# Patient Record
Sex: Male | Born: 1940 | Race: White | Hispanic: No | Marital: Married | Smoking: Never smoker
Health system: Southern US, Community
[De-identification: ages and names within clinical notes are randomized; demographics above are authoritative.]

## PROBLEM LIST (undated history)

## (undated) DIAGNOSIS — I509 Heart failure, unspecified: Secondary | ICD-10-CM

## (undated) DIAGNOSIS — G4730 Sleep apnea, unspecified: Secondary | ICD-10-CM

## (undated) DIAGNOSIS — I4891 Unspecified atrial fibrillation: Secondary | ICD-10-CM

## (undated) DIAGNOSIS — I219 Acute myocardial infarction, unspecified: Secondary | ICD-10-CM

## (undated) DIAGNOSIS — I499 Cardiac arrhythmia, unspecified: Secondary | ICD-10-CM

## (undated) DIAGNOSIS — I2510 Atherosclerotic heart disease of native coronary artery without angina pectoris: Secondary | ICD-10-CM

## (undated) DIAGNOSIS — I639 Cerebral infarction, unspecified: Secondary | ICD-10-CM

## (undated) DIAGNOSIS — M1990 Unspecified osteoarthritis, unspecified site: Secondary | ICD-10-CM

## (undated) DIAGNOSIS — C801 Malignant (primary) neoplasm, unspecified: Secondary | ICD-10-CM

## (undated) HISTORY — PX: EYE SURGERY: SHX253

## (undated) HISTORY — PX: TONSILLECTOMY: SUR1361

## (undated) HISTORY — PX: CARDIAC SURGERY: SHX584

## (undated) HISTORY — PX: CARDIAC CATHETERIZATION: SHX172

## (undated) HISTORY — PX: APPENDECTOMY: SHX54

## (undated) HISTORY — PX: CARDIAC VALVE REPLACEMENT: SHX585

## (undated) HISTORY — PX: HERNIA REPAIR: SHX51

## (undated) HISTORY — PX: CORONARY ARTERY BYPASS GRAFT: SHX141

---

## 2004-07-17 ENCOUNTER — Ambulatory Visit: Payer: Self-pay | Admitting: Unknown Physician Specialty

## 2004-08-31 ENCOUNTER — Other Ambulatory Visit: Payer: Self-pay

## 2004-09-07 ENCOUNTER — Ambulatory Visit: Payer: Self-pay | Admitting: Surgery

## 2005-11-09 ENCOUNTER — Ambulatory Visit: Payer: Self-pay | Admitting: Cardiology

## 2006-06-04 ENCOUNTER — Ambulatory Visit: Payer: Self-pay

## 2006-06-06 ENCOUNTER — Ambulatory Visit: Payer: Self-pay

## 2006-11-28 ENCOUNTER — Ambulatory Visit: Payer: Self-pay | Admitting: Cardiology

## 2006-12-03 ENCOUNTER — Ambulatory Visit: Payer: Self-pay | Admitting: Cardiology

## 2007-07-24 ENCOUNTER — Ambulatory Visit: Payer: Self-pay | Admitting: General Practice

## 2007-08-11 ENCOUNTER — Encounter: Payer: Self-pay | Admitting: General Practice

## 2007-08-25 ENCOUNTER — Encounter: Payer: Self-pay | Admitting: General Practice

## 2007-12-01 ENCOUNTER — Ambulatory Visit: Payer: Self-pay | Admitting: Cardiology

## 2007-12-02 ENCOUNTER — Ambulatory Visit: Payer: Self-pay | Admitting: Cardiology

## 2008-06-02 ENCOUNTER — Ambulatory Visit: Payer: Self-pay | Admitting: General Practice

## 2009-05-12 ENCOUNTER — Encounter: Admission: RE | Admit: 2009-05-12 | Discharge: 2009-05-12 | Payer: Self-pay | Admitting: Neurosurgery

## 2009-12-13 ENCOUNTER — Encounter: Admission: RE | Admit: 2009-12-13 | Discharge: 2009-12-13 | Payer: Self-pay | Admitting: Neurosurgery

## 2010-02-28 ENCOUNTER — Encounter: Admission: RE | Admit: 2010-02-28 | Discharge: 2010-02-28 | Payer: Self-pay | Admitting: Neurosurgery

## 2011-01-26 ENCOUNTER — Other Ambulatory Visit: Payer: Self-pay | Admitting: Neurosurgery

## 2011-01-26 DIAGNOSIS — M47816 Spondylosis without myelopathy or radiculopathy, lumbar region: Secondary | ICD-10-CM

## 2011-01-29 ENCOUNTER — Ambulatory Visit
Admission: RE | Admit: 2011-01-29 | Discharge: 2011-01-29 | Disposition: A | Discharge status: Home / Self Care | Payer: PRIVATE HEALTH INSURANCE | Source: Ambulatory Visit | Attending: Neurosurgery | Admitting: Neurosurgery

## 2011-01-29 DIAGNOSIS — M47816 Spondylosis without myelopathy or radiculopathy, lumbar region: Secondary | ICD-10-CM

## 2011-05-07 ENCOUNTER — Emergency Department: Payer: Self-pay | Admitting: *Deleted

## 2011-05-21 ENCOUNTER — Other Ambulatory Visit: Payer: Self-pay | Admitting: Neurosurgery

## 2011-05-21 DIAGNOSIS — M47816 Spondylosis without myelopathy or radiculopathy, lumbar region: Secondary | ICD-10-CM

## 2011-05-23 ENCOUNTER — Ambulatory Visit
Admission: RE | Admit: 2011-05-23 | Discharge: 2011-05-23 | Disposition: A | Discharge status: Home / Self Care | Payer: PRIVATE HEALTH INSURANCE | Source: Ambulatory Visit | Attending: Neurosurgery | Admitting: Neurosurgery

## 2011-05-23 DIAGNOSIS — M47816 Spondylosis without myelopathy or radiculopathy, lumbar region: Secondary | ICD-10-CM

## 2011-05-23 MED ORDER — IOHEXOL 180 MG/ML  SOLN
1.0000 mL | Freq: Once | INTRAMUSCULAR | Status: AC | PRN
  Administered 2011-05-23: 1 mL via EPIDURAL

## 2011-05-23 MED ORDER — METHYLPREDNISOLONE ACETATE 40 MG/ML INJ SUSP (RADIOLOG
120.0000 mg | Freq: Once | INTRAMUSCULAR | Status: AC
  Administered 2011-05-23: 120 mg via EPIDURAL

## 2011-06-15 ENCOUNTER — Ambulatory Visit: Payer: Self-pay | Admitting: Surgery

## 2011-06-22 ENCOUNTER — Ambulatory Visit: Payer: Self-pay | Admitting: Surgery

## 2011-08-07 ENCOUNTER — Other Ambulatory Visit: Payer: Self-pay | Admitting: Neurosurgery

## 2011-08-07 DIAGNOSIS — M47816 Spondylosis without myelopathy or radiculopathy, lumbar region: Secondary | ICD-10-CM

## 2011-08-08 ENCOUNTER — Other Ambulatory Visit: Payer: PRIVATE HEALTH INSURANCE

## 2011-08-08 ENCOUNTER — Ambulatory Visit
Admission: RE | Admit: 2011-08-08 | Discharge: 2011-08-08 | Disposition: A | Discharge status: Home / Self Care | Payer: PRIVATE HEALTH INSURANCE | Source: Ambulatory Visit | Attending: Neurosurgery | Admitting: Neurosurgery

## 2011-08-08 DIAGNOSIS — M47816 Spondylosis without myelopathy or radiculopathy, lumbar region: Secondary | ICD-10-CM

## 2011-08-08 MED ORDER — IOHEXOL 180 MG/ML  SOLN
1.0000 mL | Freq: Once | INTRAMUSCULAR | Status: AC | PRN
  Administered 2011-08-08: 1 mL via EPIDURAL

## 2011-08-08 MED ORDER — METHYLPREDNISOLONE ACETATE 40 MG/ML INJ SUSP (RADIOLOG
120.0000 mg | Freq: Once | INTRAMUSCULAR | Status: AC
  Administered 2011-08-08: 120 mg via EPIDURAL

## 2011-08-09 ENCOUNTER — Other Ambulatory Visit: Payer: PRIVATE HEALTH INSURANCE

## 2013-02-26 ENCOUNTER — Ambulatory Visit: Payer: Self-pay | Admitting: Internal Medicine

## 2013-03-12 ENCOUNTER — Ambulatory Visit: Payer: Self-pay | Admitting: Internal Medicine

## 2014-03-06 DIAGNOSIS — Z951 Presence of aortocoronary bypass graft: Secondary | ICD-10-CM | POA: Insufficient documentation

## 2014-03-06 DIAGNOSIS — G4733 Obstructive sleep apnea (adult) (pediatric): Secondary | ICD-10-CM

## 2014-04-15 ENCOUNTER — Ambulatory Visit: Payer: Self-pay | Admitting: Internal Medicine

## 2014-12-04 ENCOUNTER — Inpatient Hospital Stay: Payer: Self-pay | Admitting: Internal Medicine

## 2015-01-06 ENCOUNTER — Ambulatory Visit
Admit: 2015-01-06 | Disposition: A | Discharge status: Home / Self Care | Payer: Self-pay | Attending: Cardiology | Admitting: Cardiology

## 2015-01-23 NOTE — Consult Note (Signed)
PATIENT NAME:  Juan [name], Juan [name] MR#:  [id number] DATE OF BIRTH:  05-24-1941  DATE OF CONSULTATION:  12/05/2014   REFERRING PHYSICIAN:  Dr. Bridgett Larsson.  CONSULTING PHYSICIAN:  Corey Skains, MD  REASON FOR CONSULTATION: Ventricular tachycardia, atrial fibrillation, elevated troponin.   CHIEF COMPLAINT: "Just weak and fatigued."   HISTORY OF PRESENT ILLNESS: This is a 74 year old male with known coronary artery disease, status post coronary artery bypass graft and left circumflex stenting in the past but no apparent previous myocardial infarction. The patient has also had a mitral valve prolapse, status post mitral valve ring in the remote past. The patient has had a recent evaluation from a cardiac standpoint in December for which he has done well. Recently, he has been mildly short of breath with physical activity, and he has had some weakness, nausea and vomiting. When seen in the Emergency Room, he had an elevated troponin of 0.42 with atrial fibrillation with rapid ventricular rate. In addition to that, telemetry had shown short runs of ventricular tachycardia, consistent with other significant concerns. The patient has had an echocardiogram also showing severe global LV systolic dysfunction with an ejection fraction of 20% and 4-chamber dilated cardiomyopathy of unknown etiology which is new. There has been no other congestive heart failure type symptoms at this time. Currently, the patient feels relatively well but has had some worsening episodes of shortness of breath and chest discomfort when doing physical activity in the yard. This has been slightly more frequent and intense over the last several weeks.   The remainder of the review of systems is negative for vision change, ringing in the ears, hearing loss, cough, congestion, bloody stools, stomach pain, extremity pain, leg weakness, cramping of the buttocks, known blood clots, headaches, blackouts, dizzy spells, nosebleeds, trouble swallowing,  frequent urination, urination at night, muscle weakness, numbness, anxiety, depression, skin lesions or skin rashes.   PAST MEDICAL HISTORY:  1.  Mitral valve prolapse, status post mitral valve ring.  2.  Coronary artery disease, status post coronary artery bypass graft.  3.  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  4.  Mixed hyperlipidemia.  5.  Congestive heart failure.   FAMILY HISTORY: Multiple family history of valvular heart disease as well as atrial fibrillation and hypertension.   SOCIAL HISTORY: Currently denies alcohol or tobacco use.   ALLERGIES: As listed.   MEDICATIONS: As listed.   PHYSICAL EXAMINATION:  VITAL SIGNS: Blood pressure is 132/68 bilaterally, heart rate 112 and slightly irregular.  GENERAL: He is a well-appearing male, in no acute distress.  HEENT: No icterus, thyromegaly, ulcers, hemorrhage or xanthelasma.  CARDIOVASCULAR: Regular rate and rhythm. Normal S1, S2. A 2/6 apical murmur, consistent with mitral regurgitation. PMI is inferiorly displaced. Carotid upstroke normal without bruit. Jugular venous pressure is normal.  LUNGS: Have bibasilar crackles with normal respirations.  ABDOMEN: Soft, nontender without hepatosplenomegaly or masses. Abdominal aorta is normal size without bruit.  EXTREMITIES: Show 2+ radial, femoral and dorsal pedal pulses with trace lower extremity edema. No cyanosis, clubbing or ulcers.  NEUROLOGIC: He is oriented to time, place and person with normal mood and affect.   ASSESSMENT: This is a 74 year old male with known coronary artery disease, status post coronary artery bypass graft and previous stenting with mitral valve prolapse, status post repair, mixed hyperlipidemia, essential hypertension with new onset of symptoms, consistent with non-ST elevation myocardial infarction with elevation of troponin and severe left ventricular systolic dysfunction with paroxysmal valvular atrial fibrillation.   RECOMMENDATIONS:  1.  Heparinization for myocardial infarction and coronary artery disease, status post coronary artery bypass graft as well as concerns of stroke risk with atrial fibrillation.  2.  Continue serial ECG and enzymes to assess for myocardial infarction.  3.  Proceed to right and left heart catheterization for assessment of coronary anatomy due to non-ST elevation myocardial infarction and severe LV systolic dysfunction and for other evaluation of valvular heart disease as well. The patient understands the risks and benefits of cardiac catheterization. This includes the possibility of death, stroke, heart attack, infection, bleeding or blood clot. He is at low risk for conscious sedation.  4.  Heart rate control with beta blocker for further risk reduction of cardiovascular event and better heart rate control with maintenance of normal sinus rhythm and reduction of ventricular tachycardia.  5.  Further diagnostic testing and treatment options after above.    ____________________________ Corey Skains, MD bjk:JT D: 12/05/2014 08:47:12 ET T: 12/05/2014 14:24:29 ET JOB#: [id number]  cc: Corey Skains, MD, <Dictator> Corey Skains MD ELECTRONICALLY SIGNED 12/09/2014 8:10

## 2015-01-23 NOTE — Discharge Summary (Signed)
PATIENT NAME:  Juan [name], Juan [name] MR#:  [id number] DATE OF BIRTH:  21-Jun-1941  DATE OF ADMISSION:  12/04/2014 DATE OF DISCHARGE:  12/08/2014  REASON FOR ADMISSION: Diarrhea.   HISTORY OF PRESENT ILLNESS: Please see the dictated HPI done by Dr. Bridgett Larsson on 12/04/2014.   PAST MEDICAL HISTORY: 1. ASCVD.  2. Hyperlipidemia.  3. Chronic atrial fibrillation.  4. Status post mitral valve repair.  5. Status post coronary artery bypass grafting.  6. History of pneumothorax.  7. Status post appendectomy.   MEDICATIONS ON ADMISSION: Please see admission note.   ALLERGIES: No known drug allergies.   SOCIAL HISTORY:   Per admission note.   FAMILY HISTORY:  Per admission note.   REVIEW OF SYSTEMS:  Per admission note.   PHYSICAL EXAMINATION: GENERAL: The patient was in no acute distress.  VITAL SIGNS: Were initially remarkable for a blood pressure of 83/64 with a heart rate of 107 which worsened.  HEENT: Was unremarkable.  NECK:  Was supple without JVD.  LUNGS:  Were clear.  CARDIAC: Irregularly irregular rhythm with a rapid rate.   ABDOMEN: Was soft and nontender.  EXTREMITIES: Without edema.  NEUROLOGIC: Grossly nonfocal.   HOSPITAL COURSE: The patient was admitted with diarrhea, presumably due to gastroenteritis with hypotension. He also developed rapid atrial fibrillation. The patient was admitted to the intensive care unit. His troponins were elevated. He was seen in consultation by cardiology. His A-fib was controlled with IV medications. He was subsequently taken to the cath lab which revealed  stable chronic disease with known cardiomyopathy. He was felt to have demand ischemia from his rapid A-fib. His symptoms improved and he was moved out to the floor where he remained stable. He was tolerating his diet well without diarrhea. His A-fib was rate controlled. He subsequently discharged home on 12/08/2014 in stable condition.   DISCHARGE DIAGNOSES: 1. Acute gastroenteritis with diarrhea.   2. Dehydration and hypotension.  3. Rapid atrial fibrillation.  4. Elevated troponin due to demand ischemia.  5. Dilated cardiomyopathy.   DISCHARGE MEDICATIONS: 1. Multivitamin 1 p.o. daily.  2. Niaspan ER 250 mg p.o. q.p.m.  3. Welchol 625 mg 2 tablets b.i.d.  4. Myrbetriq 25 mg p.o. daily.  5. Zetia 10 mg p.o. daily.  6. Norco 5/325, 1 p.o. q. 4 h. p.r.n. pain.  7. Lanoxin 0.125 mg p.o. daily.  8. Eliquis 5 mg p.o. b.i.d.  9. Lopressor 50 mg p.o. t.i.d.  10. Senna 1 p.o. b.i.d. as needed.   FOLLOW-UP PLANS AND APPOINTMENTS: The patient was discharged on a low fat, low cholesterol diet. He will be followed by home health. He will return to see me in the office within one week's time, sooner if needed.    ____________________________ Leonie Douglas Doy Hutching, MD jds:tr D: 12/11/2014 10:56:31 ET T: 12/11/2014 12:20:50 ET JOB#: [id number]  cc: Leonie Douglas. Doy Hutching, MD, <Dictator> Nayanna Seaborn Lennice Sites MD ELECTRONICALLY SIGNED 12/11/2014 15:00

## 2015-01-23 NOTE — H&P (Signed)
PATIENT NAME:  Juan [name], Juan [name] MR#:  [id number] DATE OF BIRTH:  Jul 15, 1941  DATE OF ADMISSION:  12/04/2014  PRIMARY CARE PHYSICIAN   Felipa Furnace, MD    CHIEF COMPLAINT: Diarrhea for 3 to 4 days.   HISTORY OF PRESENT ILLNESS: A 74 year old Caucasian male with a history of CAD and chronic atrial fibrillation presents to the ED with the above chief complaint.  The patient is alert, awake, oriented, in no acute distress.   The patient said he has had diarrhea with watery stool for the past 3 to 4 days. The patient also feels weak and dizzy, so he came to the ED for further evaluation.  The patient was noted to have atrial fibrillation with RVR at 180s.  The patient was treated with normal saline. His blood pressure was high at 190s but later dropped to 70s to 80s.  The patient was treated with bolus normal saline, 3 liters.  Blood pressure was still low at 75/43. Heart rate is better but is still uncontrolled at 130s to 140s.  The patient was also treated with aspirin in the ED.  The patient has chronic atrial fibrillation.  Rate has been controlled. He is not on any anticoagulation, except aspirin 325 mg p.o. daily.  The patient denies any fever or chills. No dysuria or hematuria.  He said he has abdominal pain, which is crampy, sharp, and intermittent 8/10 at maximum, but he has no nausea or vomiting. He denies any melena or bloody stool.   PAST MEDICAL HISTORY: Coronary artery disease, hyperlipidemia, chronic atrial fibrillation.   SOCIAL HISTORY: No smoking, alchol or illicit drugs.   PAST SURGICAL HISTORY: Mitral valve repair and bypass, collapsed lung status post chest tube placement, appendectomy, hernia repair twice and skin cancer removal.   FAMILY HISTORY: Father had a heart attack at 63 years old.   ALLERGIES: None.   HOME MEDICATIONS:  Zetia 10 mg p.o. at bedtime, Welchol 625 mg 3 tablets b.i.d., vitamin E 400 units 1 tablet daily, Vicodin 5 mg-500 mg p.o. tablets 1 to 2 tablets every 6  hours p.r.n., Stresstabs with zinc 1 tablet in the a.m.. Norco 5/325 mg 1 to 2 tablets every 4 hours p.r.n., Niaspan-ER 500 mg p.o. once a day at bedtime, Nadolol 20 mg 0.5 tablet once a day with evening meal. Gelnique gel 10% 1 pack topically once a day in the morning, fish oil 1400 mg p.o. daily, aspirin 325 mg p.o. at bedtime, Altace 2.5 mg p.o. daily.   REVIEW OF SYSTEMS.  CONSTITUTIONAL: The patient denies any fever or chills, but has headache, dizziness, and weakness.  EYES:  No double vision.  ENT: No postnasal drip, slurred speech or dysphagia.  CARDIOVASCULAR: No chest pain, palpitation, orthopnea, or nocturnal dyspnea. No leg edema.  PULMONARY: No cough, sputum, shortness of breath, or hematemesis.  GASTROINTESTINAL: Positive for abdominal pain. No nausea and vomiting but has watery diarrhea. No melena or bloody stool.  GENITOURINARY: No dysuria, hematuria, or incontinence.  SKIN: No rash or jaundice.  NEUROLOGY: No syncope, loss of consciousness, or seizure.  ENDOCRINOLOGY: No polyuria, polydipsia, heat or cold intolerance.  HEMATOLOGY: No easy bleeding or bleeding.   PHYSICAL EXAMINATION:  VITAL SIGNS: Temperature 98.8, blood pressure 83/64, pulse was 107.  When I examined the patient, pulse was 130 and blood pressure was a 75/43, oxygen saturation 93% on room air.  GENERAL: The patient is alert, awake, oriented, in no acute distress.  HEENT: Pupils round, equal and reactive to light  and accommodation. Moist oral mucosa. Clear oropharynx.  NECK: Supple. No JVD or carotid bruit. No lymphadenopathy. No thyromegaly.  CARDIOVASCULAR: S1, S2, irregular rate and rhythm tachycardia, no murmurs or gallops.  PULMONARY: Bilateral air entry. No wheezing or rales.  No use of accessory muscles to breathe.  ABDOMEN: Soft. No distention or tenderness. No organomegaly. Bowel sounds present.  EXTREMITIES: No edema, clubbing or cyanosis. No calf tenderness. Bilateral pedal pulses present.  SKIN: No  rash or jaundice.  NEUROLOGIC: A and O x 3. No focal deficit. Power 5/5.  Sensation intact.   LABORATORY DATA: Troponin 0.04. WBC11.9 , platelets 111,000, hemoglobin 14.7, glucose 178, BUN 14, creatinine 1.29, sodium 132, potassium 3.8, chloride 102, bicarbonate 19, lipase 48.  EKG shows atrial fibrillation with RVR at 167.   IMPRESSIONS:  1.  Hypotension, possibly due to atrial fibrillation and diarrhea. 2.  Atrial fibrillation with rapid ventricular response.  3.  Possible acute gastroenteritis.  4.  Dehydration.  5.  Hyponatremia.  6.  Elevated troponin, possibly due to atrial fibrillation with rapid ventricular response.  7.  Coronary artery disease.   PLAN OF TREATMENT:  1.  The patient will be admitted to stepdown unit. I ordered another refill for normal saline bolus and will start Levophed if the patient's blood pressure is still low, titrate Levophed to keep MAP above 65 and continue normal saline 150 mL/hour.  2.  Check BMP tomorrow morning.  3.  Atrial fibrillation with rapid ventricular response in addition to give IV fluid support, I started digoxin 0.25 mg IV 1 dose and if his heart rate is still not controlled, I will continue 0.125 mg IV every 6 hours for another 2 doses.  I will get an echocardiograph and cardiology consult from [facility] cardiologist.  In addition, we will start on Lovenox 1 mg/kg every 12 hours and defer p.o. anticoagulation recommendation to cardiologist.  4.  For acute gastroenteritis, need to rule out Clostridium difficile colitis.  I will start Cipro and Flagyl, check stool Clostridium difficile and stool culture.  5.  For hyponatremia and dehydration, as mentioned above, I will give normal saline IV, follow up BMP.  6.  For coronary artery disease, continue statin and aspirin.  7.  I discussed the patient's condition and plan of treatment with the patient and the patient's wife.  The patient wants FULL CODE.   TIME SPENT: About 67 minutes.      ____________________________ Demetrios Loll, MD qc:DT D: 12/04/2014 10:49:13 ET T: 12/04/2014 12:54:00 ET JOB#: [id number]  cc: Demetrios Loll, MD, <Dictator> Demetrios Loll MD ELECTRONICALLY SIGNED 12/04/2014 16:55

## 2015-02-10 DIAGNOSIS — I4891 Unspecified atrial fibrillation: Secondary | ICD-10-CM | POA: Diagnosis present

## 2015-10-11 DIAGNOSIS — Z1211 Encounter for screening for malignant neoplasm of colon: Secondary | ICD-10-CM | POA: Diagnosis not present

## 2015-10-12 DIAGNOSIS — C44519 Basal cell carcinoma of skin of other part of trunk: Secondary | ICD-10-CM | POA: Diagnosis not present

## 2015-10-12 DIAGNOSIS — L905 Scar conditions and fibrosis of skin: Secondary | ICD-10-CM | POA: Diagnosis not present

## 2015-10-18 DIAGNOSIS — N3281 Overactive bladder: Secondary | ICD-10-CM | POA: Diagnosis not present

## 2016-01-11 DIAGNOSIS — Z08 Encounter for follow-up examination after completed treatment for malignant neoplasm: Secondary | ICD-10-CM | POA: Diagnosis not present

## 2016-01-11 DIAGNOSIS — L821 Other seborrheic keratosis: Secondary | ICD-10-CM | POA: Diagnosis not present

## 2016-01-11 DIAGNOSIS — L570 Actinic keratosis: Secondary | ICD-10-CM | POA: Diagnosis not present

## 2016-01-11 DIAGNOSIS — L700 Acne vulgaris: Secondary | ICD-10-CM | POA: Diagnosis not present

## 2016-01-11 DIAGNOSIS — X32XXXA Exposure to sunlight, initial encounter: Secondary | ICD-10-CM | POA: Diagnosis not present

## 2016-01-11 DIAGNOSIS — Z85828 Personal history of other malignant neoplasm of skin: Secondary | ICD-10-CM | POA: Diagnosis not present

## 2016-03-13 DIAGNOSIS — I480 Paroxysmal atrial fibrillation: Secondary | ICD-10-CM | POA: Diagnosis not present

## 2016-03-13 DIAGNOSIS — I2510 Atherosclerotic heart disease of native coronary artery without angina pectoris: Secondary | ICD-10-CM | POA: Diagnosis not present

## 2016-03-13 DIAGNOSIS — E782 Mixed hyperlipidemia: Secondary | ICD-10-CM | POA: Diagnosis not present

## 2016-03-13 DIAGNOSIS — E559 Vitamin D deficiency, unspecified: Secondary | ICD-10-CM | POA: Diagnosis not present

## 2016-03-13 DIAGNOSIS — Z79899 Other long term (current) drug therapy: Secondary | ICD-10-CM | POA: Diagnosis not present

## 2016-03-13 DIAGNOSIS — R7309 Other abnormal glucose: Secondary | ICD-10-CM | POA: Diagnosis not present

## 2016-03-15 DIAGNOSIS — I2510 Atherosclerotic heart disease of native coronary artery without angina pectoris: Secondary | ICD-10-CM | POA: Diagnosis not present

## 2016-03-15 DIAGNOSIS — I4891 Unspecified atrial fibrillation: Secondary | ICD-10-CM | POA: Diagnosis not present

## 2016-03-15 DIAGNOSIS — I059 Rheumatic mitral valve disease, unspecified: Secondary | ICD-10-CM | POA: Diagnosis not present

## 2016-03-20 DIAGNOSIS — Z79899 Other long term (current) drug therapy: Secondary | ICD-10-CM | POA: Diagnosis not present

## 2016-03-20 DIAGNOSIS — R7309 Other abnormal glucose: Secondary | ICD-10-CM | POA: Diagnosis not present

## 2016-03-20 DIAGNOSIS — E559 Vitamin D deficiency, unspecified: Secondary | ICD-10-CM | POA: Diagnosis not present

## 2016-03-20 DIAGNOSIS — E782 Mixed hyperlipidemia: Secondary | ICD-10-CM | POA: Diagnosis not present

## 2016-03-22 DIAGNOSIS — Z9889 Other specified postprocedural states: Secondary | ICD-10-CM | POA: Diagnosis not present

## 2016-03-22 DIAGNOSIS — E782 Mixed hyperlipidemia: Secondary | ICD-10-CM | POA: Diagnosis not present

## 2016-03-22 DIAGNOSIS — Z87898 Personal history of other specified conditions: Secondary | ICD-10-CM | POA: Diagnosis not present

## 2016-03-22 DIAGNOSIS — I059 Rheumatic mitral valve disease, unspecified: Secondary | ICD-10-CM | POA: Diagnosis not present

## 2016-03-22 DIAGNOSIS — I480 Paroxysmal atrial fibrillation: Secondary | ICD-10-CM | POA: Diagnosis not present

## 2016-06-04 DIAGNOSIS — R351 Nocturia: Secondary | ICD-10-CM | POA: Diagnosis not present

## 2016-06-04 DIAGNOSIS — R350 Frequency of micturition: Secondary | ICD-10-CM | POA: Diagnosis not present

## 2016-06-04 DIAGNOSIS — N401 Enlarged prostate with lower urinary tract symptoms: Secondary | ICD-10-CM | POA: Diagnosis not present

## 2016-06-04 DIAGNOSIS — N3281 Overactive bladder: Secondary | ICD-10-CM | POA: Diagnosis not present

## 2016-07-12 DIAGNOSIS — L570 Actinic keratosis: Secondary | ICD-10-CM | POA: Diagnosis not present

## 2016-07-12 DIAGNOSIS — Z08 Encounter for follow-up examination after completed treatment for malignant neoplasm: Secondary | ICD-10-CM | POA: Diagnosis not present

## 2016-07-12 DIAGNOSIS — D485 Neoplasm of uncertain behavior of skin: Secondary | ICD-10-CM | POA: Diagnosis not present

## 2016-07-12 DIAGNOSIS — L700 Acne vulgaris: Secondary | ICD-10-CM | POA: Diagnosis not present

## 2016-07-12 DIAGNOSIS — X32XXXA Exposure to sunlight, initial encounter: Secondary | ICD-10-CM | POA: Diagnosis not present

## 2016-07-12 DIAGNOSIS — D225 Melanocytic nevi of trunk: Secondary | ICD-10-CM | POA: Diagnosis not present

## 2016-07-12 DIAGNOSIS — Z85828 Personal history of other malignant neoplasm of skin: Secondary | ICD-10-CM | POA: Diagnosis not present

## 2016-07-12 DIAGNOSIS — L814 Other melanin hyperpigmentation: Secondary | ICD-10-CM | POA: Diagnosis not present

## 2016-07-24 DIAGNOSIS — D485 Neoplasm of uncertain behavior of skin: Secondary | ICD-10-CM | POA: Diagnosis not present

## 2016-07-24 DIAGNOSIS — D0439 Carcinoma in situ of skin of other parts of face: Secondary | ICD-10-CM | POA: Diagnosis not present

## 2016-09-19 DIAGNOSIS — I059 Rheumatic mitral valve disease, unspecified: Secondary | ICD-10-CM | POA: Diagnosis not present

## 2016-09-19 DIAGNOSIS — I2510 Atherosclerotic heart disease of native coronary artery without angina pectoris: Secondary | ICD-10-CM | POA: Diagnosis not present

## 2016-09-19 DIAGNOSIS — I4891 Unspecified atrial fibrillation: Secondary | ICD-10-CM | POA: Diagnosis not present

## 2016-09-19 DIAGNOSIS — E782 Mixed hyperlipidemia: Secondary | ICD-10-CM | POA: Diagnosis not present

## 2016-09-25 DIAGNOSIS — I480 Paroxysmal atrial fibrillation: Secondary | ICD-10-CM | POA: Diagnosis not present

## 2016-09-25 DIAGNOSIS — R2689 Other abnormalities of gait and mobility: Secondary | ICD-10-CM | POA: Diagnosis not present

## 2016-09-25 DIAGNOSIS — Z0000 Encounter for general adult medical examination without abnormal findings: Secondary | ICD-10-CM | POA: Diagnosis not present

## 2016-09-25 DIAGNOSIS — E559 Vitamin D deficiency, unspecified: Secondary | ICD-10-CM | POA: Diagnosis not present

## 2016-09-25 DIAGNOSIS — Z9989 Dependence on other enabling machines and devices: Secondary | ICD-10-CM | POA: Diagnosis not present

## 2016-09-25 DIAGNOSIS — E782 Mixed hyperlipidemia: Secondary | ICD-10-CM | POA: Diagnosis not present

## 2016-09-25 DIAGNOSIS — Z1211 Encounter for screening for malignant neoplasm of colon: Secondary | ICD-10-CM | POA: Diagnosis not present

## 2016-09-25 DIAGNOSIS — Z79899 Other long term (current) drug therapy: Secondary | ICD-10-CM | POA: Diagnosis not present

## 2016-09-25 DIAGNOSIS — G4733 Obstructive sleep apnea (adult) (pediatric): Secondary | ICD-10-CM | POA: Diagnosis not present

## 2016-10-08 DIAGNOSIS — Z1211 Encounter for screening for malignant neoplasm of colon: Secondary | ICD-10-CM | POA: Diagnosis not present

## 2016-10-09 DIAGNOSIS — E782 Mixed hyperlipidemia: Secondary | ICD-10-CM | POA: Diagnosis not present

## 2016-10-09 DIAGNOSIS — Z79899 Other long term (current) drug therapy: Secondary | ICD-10-CM | POA: Diagnosis not present

## 2016-10-09 DIAGNOSIS — R2689 Other abnormalities of gait and mobility: Secondary | ICD-10-CM | POA: Diagnosis not present

## 2016-10-09 DIAGNOSIS — E559 Vitamin D deficiency, unspecified: Secondary | ICD-10-CM | POA: Diagnosis not present

## 2017-01-04 DIAGNOSIS — G4733 Obstructive sleep apnea (adult) (pediatric): Secondary | ICD-10-CM | POA: Diagnosis not present

## 2017-01-21 DIAGNOSIS — D225 Melanocytic nevi of trunk: Secondary | ICD-10-CM | POA: Diagnosis not present

## 2017-01-21 DIAGNOSIS — X32XXXA Exposure to sunlight, initial encounter: Secondary | ICD-10-CM | POA: Diagnosis not present

## 2017-01-21 DIAGNOSIS — Z85828 Personal history of other malignant neoplasm of skin: Secondary | ICD-10-CM | POA: Diagnosis not present

## 2017-01-21 DIAGNOSIS — L570 Actinic keratosis: Secondary | ICD-10-CM | POA: Diagnosis not present

## 2017-01-21 DIAGNOSIS — L728 Other follicular cysts of the skin and subcutaneous tissue: Secondary | ICD-10-CM | POA: Diagnosis not present

## 2017-01-21 DIAGNOSIS — D2261 Melanocytic nevi of right upper limb, including shoulder: Secondary | ICD-10-CM | POA: Diagnosis not present

## 2017-02-01 DIAGNOSIS — E782 Mixed hyperlipidemia: Secondary | ICD-10-CM | POA: Diagnosis not present

## 2017-02-01 DIAGNOSIS — I2510 Atherosclerotic heart disease of native coronary artery without angina pectoris: Secondary | ICD-10-CM | POA: Diagnosis not present

## 2017-02-01 DIAGNOSIS — I4891 Unspecified atrial fibrillation: Secondary | ICD-10-CM | POA: Diagnosis not present

## 2017-02-01 DIAGNOSIS — R0602 Shortness of breath: Secondary | ICD-10-CM | POA: Diagnosis not present

## 2017-02-01 DIAGNOSIS — R0789 Other chest pain: Secondary | ICD-10-CM | POA: Diagnosis not present

## 2017-02-01 DIAGNOSIS — I059 Rheumatic mitral valve disease, unspecified: Secondary | ICD-10-CM | POA: Diagnosis not present

## 2017-02-06 DIAGNOSIS — I480 Paroxysmal atrial fibrillation: Secondary | ICD-10-CM | POA: Diagnosis not present

## 2017-02-06 DIAGNOSIS — Z87898 Personal history of other specified conditions: Secondary | ICD-10-CM | POA: Diagnosis not present

## 2017-02-15 ENCOUNTER — Other Ambulatory Visit: Payer: Self-pay | Admitting: Internal Medicine

## 2017-02-15 DIAGNOSIS — R413 Other amnesia: Secondary | ICD-10-CM | POA: Diagnosis not present

## 2017-02-15 DIAGNOSIS — R4701 Aphasia: Secondary | ICD-10-CM | POA: Diagnosis not present

## 2017-02-27 ENCOUNTER — Ambulatory Visit
Admission: RE | Admit: 2017-02-27 | Discharge: 2017-02-27 | Disposition: A | Discharge status: Home / Self Care | Payer: PPO | Source: Ambulatory Visit | Attending: Internal Medicine | Admitting: Internal Medicine

## 2017-02-27 DIAGNOSIS — R413 Other amnesia: Secondary | ICD-10-CM | POA: Insufficient documentation

## 2017-02-27 DIAGNOSIS — R9082 White matter disease, unspecified: Secondary | ICD-10-CM | POA: Diagnosis not present

## 2017-02-27 DIAGNOSIS — Z8673 Personal history of transient ischemic attack (TIA), and cerebral infarction without residual deficits: Secondary | ICD-10-CM | POA: Diagnosis not present

## 2017-02-27 DIAGNOSIS — R4701 Aphasia: Secondary | ICD-10-CM | POA: Insufficient documentation

## 2017-02-27 DIAGNOSIS — G319 Degenerative disease of nervous system, unspecified: Secondary | ICD-10-CM | POA: Diagnosis not present

## 2017-03-01 DIAGNOSIS — I2510 Atherosclerotic heart disease of native coronary artery without angina pectoris: Secondary | ICD-10-CM | POA: Diagnosis not present

## 2017-03-01 DIAGNOSIS — R0602 Shortness of breath: Secondary | ICD-10-CM | POA: Diagnosis not present

## 2017-03-04 DIAGNOSIS — I059 Rheumatic mitral valve disease, unspecified: Secondary | ICD-10-CM | POA: Diagnosis not present

## 2017-03-04 DIAGNOSIS — I2510 Atherosclerotic heart disease of native coronary artery without angina pectoris: Secondary | ICD-10-CM | POA: Diagnosis not present

## 2017-03-26 ENCOUNTER — Other Ambulatory Visit: Payer: Self-pay | Admitting: Internal Medicine

## 2017-03-26 DIAGNOSIS — I480 Paroxysmal atrial fibrillation: Secondary | ICD-10-CM | POA: Diagnosis not present

## 2017-03-26 DIAGNOSIS — E782 Mixed hyperlipidemia: Secondary | ICD-10-CM | POA: Diagnosis not present

## 2017-03-26 DIAGNOSIS — Z79899 Other long term (current) drug therapy: Secondary | ICD-10-CM | POA: Diagnosis not present

## 2017-03-26 DIAGNOSIS — I639 Cerebral infarction, unspecified: Secondary | ICD-10-CM | POA: Diagnosis not present

## 2017-03-26 DIAGNOSIS — I6381 Other cerebral infarction due to occlusion or stenosis of small artery: Secondary | ICD-10-CM

## 2017-04-03 ENCOUNTER — Ambulatory Visit
Admission: RE | Admit: 2017-04-03 | Discharge: 2017-04-03 | Disposition: A | Discharge status: Home / Self Care | Payer: PPO | Source: Ambulatory Visit | Attending: Internal Medicine | Admitting: Internal Medicine

## 2017-04-03 DIAGNOSIS — I6523 Occlusion and stenosis of bilateral carotid arteries: Secondary | ICD-10-CM | POA: Diagnosis not present

## 2017-04-03 DIAGNOSIS — I639 Cerebral infarction, unspecified: Secondary | ICD-10-CM | POA: Diagnosis present

## 2017-04-03 DIAGNOSIS — I6381 Other cerebral infarction due to occlusion or stenosis of small artery: Secondary | ICD-10-CM

## 2017-04-05 DIAGNOSIS — Z79899 Other long term (current) drug therapy: Secondary | ICD-10-CM | POA: Diagnosis not present

## 2017-04-05 DIAGNOSIS — E782 Mixed hyperlipidemia: Secondary | ICD-10-CM | POA: Diagnosis not present

## 2017-04-12 DIAGNOSIS — Z9989 Dependence on other enabling machines and devices: Secondary | ICD-10-CM | POA: Diagnosis not present

## 2017-04-12 DIAGNOSIS — R9082 White matter disease, unspecified: Secondary | ICD-10-CM | POA: Diagnosis not present

## 2017-04-12 DIAGNOSIS — G3184 Mild cognitive impairment, so stated: Secondary | ICD-10-CM | POA: Diagnosis not present

## 2017-04-12 DIAGNOSIS — Z8673 Personal history of transient ischemic attack (TIA), and cerebral infarction without residual deficits: Secondary | ICD-10-CM | POA: Diagnosis not present

## 2017-04-12 DIAGNOSIS — G4733 Obstructive sleep apnea (adult) (pediatric): Secondary | ICD-10-CM | POA: Diagnosis not present

## 2017-04-22 DIAGNOSIS — G4733 Obstructive sleep apnea (adult) (pediatric): Secondary | ICD-10-CM | POA: Diagnosis not present

## 2017-06-27 DIAGNOSIS — Z125 Encounter for screening for malignant neoplasm of prostate: Secondary | ICD-10-CM | POA: Diagnosis not present

## 2017-06-27 DIAGNOSIS — Z79899 Other long term (current) drug therapy: Secondary | ICD-10-CM | POA: Diagnosis not present

## 2017-06-27 DIAGNOSIS — E782 Mixed hyperlipidemia: Secondary | ICD-10-CM | POA: Diagnosis not present

## 2017-06-27 DIAGNOSIS — I2510 Atherosclerotic heart disease of native coronary artery without angina pectoris: Secondary | ICD-10-CM | POA: Diagnosis not present

## 2017-06-27 DIAGNOSIS — E559 Vitamin D deficiency, unspecified: Secondary | ICD-10-CM | POA: Diagnosis not present

## 2017-06-27 DIAGNOSIS — Z9989 Dependence on other enabling machines and devices: Secondary | ICD-10-CM | POA: Diagnosis not present

## 2017-06-27 DIAGNOSIS — Z0000 Encounter for general adult medical examination without abnormal findings: Secondary | ICD-10-CM | POA: Diagnosis not present

## 2017-06-27 DIAGNOSIS — G4733 Obstructive sleep apnea (adult) (pediatric): Secondary | ICD-10-CM | POA: Diagnosis not present

## 2017-08-27 DIAGNOSIS — E782 Mixed hyperlipidemia: Secondary | ICD-10-CM | POA: Diagnosis not present

## 2017-08-27 DIAGNOSIS — Z951 Presence of aortocoronary bypass graft: Secondary | ICD-10-CM | POA: Diagnosis not present

## 2017-08-27 DIAGNOSIS — I480 Paroxysmal atrial fibrillation: Secondary | ICD-10-CM | POA: Diagnosis not present

## 2017-08-27 DIAGNOSIS — R0602 Shortness of breath: Secondary | ICD-10-CM | POA: Diagnosis not present

## 2017-08-30 DIAGNOSIS — I480 Paroxysmal atrial fibrillation: Secondary | ICD-10-CM | POA: Diagnosis not present

## 2017-08-30 DIAGNOSIS — R0602 Shortness of breath: Secondary | ICD-10-CM | POA: Diagnosis not present

## 2017-09-04 DIAGNOSIS — R0789 Other chest pain: Secondary | ICD-10-CM | POA: Diagnosis not present

## 2017-09-04 DIAGNOSIS — I2510 Atherosclerotic heart disease of native coronary artery without angina pectoris: Secondary | ICD-10-CM | POA: Diagnosis not present

## 2017-09-04 DIAGNOSIS — E782 Mixed hyperlipidemia: Secondary | ICD-10-CM | POA: Diagnosis not present

## 2017-09-04 DIAGNOSIS — I480 Paroxysmal atrial fibrillation: Secondary | ICD-10-CM | POA: Diagnosis not present

## 2017-09-04 DIAGNOSIS — I059 Rheumatic mitral valve disease, unspecified: Secondary | ICD-10-CM | POA: Diagnosis not present

## 2017-09-26 DIAGNOSIS — G4733 Obstructive sleep apnea (adult) (pediatric): Secondary | ICD-10-CM | POA: Diagnosis not present

## 2017-09-30 DIAGNOSIS — D2272 Melanocytic nevi of left lower limb, including hip: Secondary | ICD-10-CM | POA: Diagnosis not present

## 2017-09-30 DIAGNOSIS — Z85828 Personal history of other malignant neoplasm of skin: Secondary | ICD-10-CM | POA: Diagnosis not present

## 2017-09-30 DIAGNOSIS — L570 Actinic keratosis: Secondary | ICD-10-CM | POA: Diagnosis not present

## 2017-09-30 DIAGNOSIS — D2261 Melanocytic nevi of right upper limb, including shoulder: Secondary | ICD-10-CM | POA: Diagnosis not present

## 2017-09-30 DIAGNOSIS — D225 Melanocytic nevi of trunk: Secondary | ICD-10-CM | POA: Diagnosis not present

## 2017-09-30 DIAGNOSIS — X32XXXA Exposure to sunlight, initial encounter: Secondary | ICD-10-CM | POA: Diagnosis not present

## 2017-10-17 DIAGNOSIS — Z9989 Dependence on other enabling machines and devices: Secondary | ICD-10-CM | POA: Diagnosis not present

## 2017-10-17 DIAGNOSIS — I482 Chronic atrial fibrillation: Secondary | ICD-10-CM | POA: Diagnosis not present

## 2017-10-17 DIAGNOSIS — G3184 Mild cognitive impairment, so stated: Secondary | ICD-10-CM | POA: Diagnosis not present

## 2017-10-17 DIAGNOSIS — G4733 Obstructive sleep apnea (adult) (pediatric): Secondary | ICD-10-CM | POA: Diagnosis not present

## 2017-10-17 DIAGNOSIS — Z8673 Personal history of transient ischemic attack (TIA), and cerebral infarction without residual deficits: Secondary | ICD-10-CM | POA: Diagnosis not present

## 2017-10-28 DIAGNOSIS — Z9989 Dependence on other enabling machines and devices: Secondary | ICD-10-CM | POA: Diagnosis not present

## 2017-10-28 DIAGNOSIS — Z79899 Other long term (current) drug therapy: Secondary | ICD-10-CM | POA: Diagnosis not present

## 2017-10-28 DIAGNOSIS — I2510 Atherosclerotic heart disease of native coronary artery without angina pectoris: Secondary | ICD-10-CM | POA: Diagnosis not present

## 2017-10-28 DIAGNOSIS — E782 Mixed hyperlipidemia: Secondary | ICD-10-CM | POA: Diagnosis not present

## 2017-10-28 DIAGNOSIS — E559 Vitamin D deficiency, unspecified: Secondary | ICD-10-CM | POA: Diagnosis not present

## 2017-10-28 DIAGNOSIS — Z1211 Encounter for screening for malignant neoplasm of colon: Secondary | ICD-10-CM | POA: Diagnosis not present

## 2017-10-28 DIAGNOSIS — G4733 Obstructive sleep apnea (adult) (pediatric): Secondary | ICD-10-CM | POA: Diagnosis not present

## 2017-10-31 DIAGNOSIS — Z79899 Other long term (current) drug therapy: Secondary | ICD-10-CM | POA: Diagnosis not present

## 2017-10-31 DIAGNOSIS — E782 Mixed hyperlipidemia: Secondary | ICD-10-CM | POA: Diagnosis not present

## 2017-11-07 DIAGNOSIS — Z1211 Encounter for screening for malignant neoplasm of colon: Secondary | ICD-10-CM | POA: Diagnosis not present

## 2017-12-05 DIAGNOSIS — M5136 Other intervertebral disc degeneration, lumbar region: Secondary | ICD-10-CM | POA: Diagnosis not present

## 2017-12-05 DIAGNOSIS — M545 Low back pain: Secondary | ICD-10-CM | POA: Diagnosis not present

## 2017-12-05 DIAGNOSIS — M4186 Other forms of scoliosis, lumbar region: Secondary | ICD-10-CM | POA: Diagnosis not present

## 2017-12-06 ENCOUNTER — Encounter: Payer: Self-pay | Admitting: Emergency Medicine

## 2017-12-06 ENCOUNTER — Emergency Department
Admission: EM | Admit: 2017-12-06 | Discharge: 2017-12-06 | Disposition: A | Discharge status: Home / Self Care | Payer: PPO | Attending: Emergency Medicine | Admitting: Emergency Medicine

## 2017-12-06 ENCOUNTER — Other Ambulatory Visit: Payer: Self-pay

## 2017-12-06 ENCOUNTER — Emergency Department: Payer: PPO

## 2017-12-06 DIAGNOSIS — Z7901 Long term (current) use of anticoagulants: Secondary | ICD-10-CM | POA: Insufficient documentation

## 2017-12-06 DIAGNOSIS — Z79899 Other long term (current) drug therapy: Secondary | ICD-10-CM | POA: Diagnosis not present

## 2017-12-06 DIAGNOSIS — I2510 Atherosclerotic heart disease of native coronary artery without angina pectoris: Secondary | ICD-10-CM | POA: Insufficient documentation

## 2017-12-06 DIAGNOSIS — R079 Chest pain, unspecified: Secondary | ICD-10-CM | POA: Diagnosis not present

## 2017-12-06 DIAGNOSIS — R002 Palpitations: Secondary | ICD-10-CM | POA: Diagnosis not present

## 2017-12-06 DIAGNOSIS — I471 Supraventricular tachycardia: Secondary | ICD-10-CM | POA: Diagnosis not present

## 2017-12-06 DIAGNOSIS — R0789 Other chest pain: Secondary | ICD-10-CM | POA: Diagnosis not present

## 2017-12-06 HISTORY — DX: Acute myocardial infarction, unspecified: I21.9

## 2017-12-06 HISTORY — DX: Atherosclerotic heart disease of native coronary artery without angina pectoris: I25.10

## 2017-12-06 HISTORY — DX: Unspecified atrial fibrillation: I48.91

## 2017-12-06 HISTORY — DX: Cerebral infarction, unspecified: I63.9

## 2017-12-06 LAB — CBC
HCT: 42.2 % (ref 40.0–52.0)
Hemoglobin: 14 g/dL (ref 13.0–18.0)
MCH: 32 pg (ref 26.0–34.0)
MCHC: 33.2 g/dL (ref 32.0–36.0)
MCV: 96.5 fL (ref 80.0–100.0)
PLATELETS: 164 10*3/uL (ref 150–440)
RBC: 4.37 MIL/uL — AB (ref 4.40–5.90)
RDW: 13.4 % (ref 11.5–14.5)
WBC: 5.5 10*3/uL (ref 3.8–10.6)

## 2017-12-06 LAB — BASIC METABOLIC PANEL
Anion gap: 11 (ref 5–15)
BUN: 17 mg/dL (ref 6–20)
CALCIUM: 9.2 mg/dL (ref 8.9–10.3)
CHLORIDE: 96 mmol/L — AB (ref 101–111)
CO2: 27 mmol/L (ref 22–32)
CREATININE: 1.1 mg/dL (ref 0.61–1.24)
Glucose, Bld: 207 mg/dL — ABNORMAL HIGH (ref 65–99)
Potassium: 4.7 mmol/L (ref 3.5–5.1)
Sodium: 134 mmol/L — ABNORMAL LOW (ref 135–145)

## 2017-12-06 LAB — DIGOXIN LEVEL: Digoxin Level: 0.7 ng/mL — ABNORMAL LOW (ref 0.8–2.0)

## 2017-12-06 LAB — TROPONIN I

## 2017-12-06 MED ORDER — ADENOSINE 6 MG/2ML IV SOLN
INTRAVENOUS | Status: AC
  Filled 2017-12-06: qty 2

## 2017-12-06 MED ORDER — SODIUM CHLORIDE 0.9 % IV BOLUS (SEPSIS)
1000.0000 mL | Freq: Once | INTRAVENOUS | Status: AC
  Administered 2017-12-06: 1000 mL via INTRAVENOUS

## 2017-12-06 MED ORDER — DILTIAZEM HCL 25 MG/5ML IV SOLN
INTRAVENOUS | Status: AC
  Administered 2017-12-06: 20 mg
  Filled 2017-12-06: qty 5

## 2017-12-06 MED ORDER — ADENOSINE 12 MG/4ML IV SOLN
INTRAVENOUS | Status: AC
  Filled 2017-12-06: qty 4

## 2017-12-06 NOTE — ED Notes (Signed)
Pt. Going home with wife. 

## 2017-12-06 NOTE — ED Provider Notes (Signed)
[facility] Emergency Department Provider Note  ____________________________________________   First MD Initiated Contact with Patient 12/06/17 1809     (approximate)  I have reviewed the triage vital signs and the nursing notes.   HISTORY  Chief Complaint Chest Pain   HPI Bryar Dahms is a 77 y.o. male with a history of atrial fibrillation on digoxin, metoprolol as well as Eliquis who is presenting to the emergency department with palpitations as well as pressure in his chest and neck that started at 4:30 PM this afternoon.  He says that he started taking steroids yesterday and believes this may be that he is getting factor.  Brought back from triage initially because of tachycardia.  Says the pressure is mild to moderate.   Past Medical History:  Diagnosis Date  . Atrial fibrillation ([place])   . Coronary artery disease   . MI (myocardial infarction) ([place])   . Stroke [facility])     There are no active problems to display for this patient.   Past Surgical History:  Procedure Laterality Date  . CARDIAC SURGERY      Prior to Admission medications   Medication Sig Start Date End Date Taking? Authorizing Provider  apixaban (ELIQUIS) 5 MG TABS tablet Take 5 mg by mouth 2 (two) times daily. 11/13/17  Yes [provider]  cholecalciferol (VITAMIN D) 1000 units tablet Take 2,000 Units by mouth daily.   Yes [provider]  colesevelam (WELCHOL) 625 MG tablet Take 3 mg by mouth 2 (two) times daily. 11/26/17  Yes [provider]  digoxin (LANOXIN) 0.125 MG tablet Take 0.125 mg by mouth daily. 09/20/17 09/20/18 Yes [provider]  doxycycline (VIBRAMYCIN) 100 MG capsule Take 100 mg by mouth daily. 11/11/17  Yes [provider]  ezetimibe (ZETIA) 10 MG tablet Take 10 mg by mouth daily as needed. 05/28/17  Yes [provider]  furosemide (LASIX) 20 MG tablet Take 20 mg by mouth daily. 09/18/17 09/18/18 Yes  [provider]  metoprolol tartrate (LOPRESSOR) 50 MG tablet Take 25 mg by mouth 2 (two) times daily. 11/04/17 05/03/18 Yes [provider]  mirabegron ER (MYRBETRIQ) 50 MG TB24 tablet Take 50 mg by mouth daily. 06/27/17  Yes [provider]  predniSONE (DELTASONE) 10 MG tablet 6 pills x 1 day, 5 pills x 1 day, 4 pills x 1 day, 3 pills x 1 day, 2 pills x 1 day. 1 pill x 1 day. 12/05/17  Yes [provider]    Allergies Patient has no known allergies.  No family history on file.  Social History Social History   Tobacco Use  . Smoking status: Never Smoker  . Smokeless tobacco: Never Used  Substance Use Topics  . Alcohol use: No    Frequency: Never  . Drug use: No    Review of Systems  Constitutional: No fever/chills Eyes: No visual changes. ENT: No sore throat. Cardiovascular: As above Respiratory: Denies shortness of breath. Gastrointestinal: No abdominal pain.  No nausea, no vomiting.  No diarrhea.  No constipation. Genitourinary: Negative for dysuria. Musculoskeletal: Negative for back pain. Skin: Negative for rash. Neurological: Negative for headaches, focal weakness or numbness.   ____________________________________________   PHYSICAL EXAM:  VITAL SIGNS: ED Triage Vitals  Enc Vitals Group     BP 12/06/17 1809 (!) 123/94     Pulse Rate 12/06/17 1809 (!) 151     Resp 12/06/17 1809 (!) 23     Temp --  Temp src --      SpO2 12/06/17 1809 98 %     Weight --      Height --      Head Circumference --      Peak Flow --      Pain Score 12/06/17 1805 8     Pain Loc --      Pain Edu? --      Excl. in [place]? --     Constitutional: Alert and oriented. Well appearing and in no acute distress. Eyes: Conjunctivae are normal.  Head: Atraumatic. Nose: No congestion/rhinnorhea. Mouth/Throat: Mucous membranes are moist.  Neck: No stridor.   Cardiovascular: Tachycardic, regular rhythm. Grossly normal heart sounds.   Respiratory:  Normal respiratory effort.  No retractions. Lungs CTAB. Gastrointestinal: Soft and nontender. No distention.  Musculoskeletal: No lower extremity tenderness nor edema.  No joint effusions. Neurologic:  Normal speech and language. No gross focal neurologic deficits are appreciated. Skin:  Skin is warm, dry and intact. No rash noted. Psychiatric: Mood and affect are normal. Speech and behavior are normal.  ____________________________________________   LABS (all labs ordered are listed, but only abnormal results are displayed)  Labs Reviewed  BASIC METABOLIC PANEL - Abnormal; Notable for the following components:      Result Value   Sodium 134 (*)    Chloride 96 (*)    Glucose, Bld 207 (*)    All other components within normal limits  CBC - Abnormal; Notable for the following components:   RBC 4.37 (*)    All other components within normal limits  DIGOXIN LEVEL - Abnormal; Notable for the following components:   Digoxin Level 0.7 (*)    All other components within normal limits  TROPONIN I   ____________________________________________  EKG  ED ECG REPORT I, Doran Stabler, the attending physician, personally viewed and interpreted this ECG.   Date: 12/06/2017  EKG Time: 1759  Rate: 150  Rhythm: SVT  Axis: Rightward axis  Intervals:nonspecific intraventricular conduction delay  ST&T Change: Multiple leads with ST depression, likely rate related.  No ST elevations.  No abnormal T wave inversion.  ED ECG REPORT I, Doran Stabler, the attending physician, personally viewed and interpreted this ECG.   Date: 12/06/2017  EKG Time: 1834  Rate: 77  Rhythm: atrial fibrillation, rate 77 with several PVCs  Axis: Normal  Intervals:nonspecific intraventricular conduction delay  ST&T Change: No ST segment elevation or depression.  T wave inversions in 2, 3 and aVF as well as V4 through 6 and a prescription morphology likely consistent with dig  effect.  ____________________________________________  RADIOLOGY  Chest x-ray without acute process. ____________________________________________   PROCEDURES  Procedure(s) performed:   .Critical Care Performed by: Orbie Pyo, MD Authorized by: Orbie Pyo, MD   Critical care provider statement:    Critical care time (minutes):  35   Critical care was necessary to treat or prevent imminent or life-threatening deterioration of the following conditions:  Cardiac failure   Critical care was time spent personally by me on the following activities:  Examination of patient, ordering and performing treatments and interventions, ordering and review of laboratory studies, ordering and review of radiographic studies, pulse oximetry and re-evaluation of patient's condition Comments:     Patient on the monitor was fairly steady at 150.  However, had several episodes where he had an irregular rhythm where his rate came down to the 120s and 140s.  Gave Cardizem because of likely intermittent atrial  fibrillation and possible atrial flutter causing the rapid rhythm.  20 mg bolus given after vagal maneuvers failed.  Patient quickly converted to atrial fibrillation which is rate controlled.    Critical Care performed:   ____________________________________________   INITIAL IMPRESSION / ASSESSMENT AND PLAN / ED COURSE  Pertinent labs & imaging results that were available during my care of the patient were reviewed by me and considered in my medical decision making (see chart for details).  Differential diagnosis includes, but is not limited to, ACS, aortic dissection, pulmonary embolism, cardiac tamponade, pneumothorax, pneumonia, pericarditis, myocarditis, GI-related causes including esophagitis/gastritis, and musculoskeletal chest wall pain.   As part of my medical decision making, I reviewed the following data within the electronic MEDICAL RECORD NUMBER Notes from prior  ED visits  ----------------------------------------- 7:49 PM on 12/06/2017 -----------------------------------------  Patient at this time remains in a rate controlled atrial fibrillation.  Discussed the case with Dr. Nehemiah Massed.  He agrees with stopping the patient's steroids and him following up with his cardiologist, Dr. Ubaldo Glassing.  No medication changes at this time.  Patient remains asymptomatic. ____________________________________________   FINAL CLINICAL IMPRESSION(S) / ED DIAGNOSES  SVT.    NEW MEDICATIONS STARTED DURING THIS VISIT:  New Prescriptions   No medications on file     Note:  This document was prepared using Dragon voice recognition software and may include unintentional dictation errors.     Orbie Pyo, MD 12/06/17 1950

## 2017-12-09 DIAGNOSIS — I482 Chronic atrial fibrillation: Secondary | ICD-10-CM | POA: Diagnosis not present

## 2018-02-03 DIAGNOSIS — R0789 Other chest pain: Secondary | ICD-10-CM | POA: Diagnosis not present

## 2018-02-03 DIAGNOSIS — I480 Paroxysmal atrial fibrillation: Secondary | ICD-10-CM | POA: Diagnosis not present

## 2018-02-13 DIAGNOSIS — I482 Chronic atrial fibrillation: Secondary | ICD-10-CM | POA: Diagnosis not present

## 2018-02-13 DIAGNOSIS — E782 Mixed hyperlipidemia: Secondary | ICD-10-CM | POA: Diagnosis not present

## 2018-02-13 DIAGNOSIS — I059 Rheumatic mitral valve disease, unspecified: Secondary | ICD-10-CM | POA: Diagnosis not present

## 2018-02-13 DIAGNOSIS — I2510 Atherosclerotic heart disease of native coronary artery without angina pectoris: Secondary | ICD-10-CM | POA: Diagnosis not present

## 2018-02-27 DIAGNOSIS — Z79899 Other long term (current) drug therapy: Secondary | ICD-10-CM | POA: Diagnosis not present

## 2018-02-27 DIAGNOSIS — Z0000 Encounter for general adult medical examination without abnormal findings: Secondary | ICD-10-CM | POA: Diagnosis not present

## 2018-02-27 DIAGNOSIS — I482 Chronic atrial fibrillation: Secondary | ICD-10-CM | POA: Diagnosis not present

## 2018-02-27 DIAGNOSIS — E782 Mixed hyperlipidemia: Secondary | ICD-10-CM | POA: Diagnosis not present

## 2018-02-27 DIAGNOSIS — E559 Vitamin D deficiency, unspecified: Secondary | ICD-10-CM | POA: Diagnosis not present

## 2018-04-10 DIAGNOSIS — G4733 Obstructive sleep apnea (adult) (pediatric): Secondary | ICD-10-CM | POA: Diagnosis not present

## 2018-05-15 DIAGNOSIS — Z961 Presence of intraocular lens: Secondary | ICD-10-CM | POA: Diagnosis not present

## 2018-06-02 ENCOUNTER — Other Ambulatory Visit: Payer: Self-pay | Admitting: Internal Medicine

## 2018-06-02 DIAGNOSIS — R413 Other amnesia: Secondary | ICD-10-CM

## 2018-06-02 DIAGNOSIS — E782 Mixed hyperlipidemia: Secondary | ICD-10-CM | POA: Diagnosis not present

## 2018-06-02 DIAGNOSIS — I482 Chronic atrial fibrillation: Secondary | ICD-10-CM | POA: Diagnosis not present

## 2018-06-02 DIAGNOSIS — Z79899 Other long term (current) drug therapy: Secondary | ICD-10-CM | POA: Diagnosis not present

## 2018-06-02 DIAGNOSIS — E559 Vitamin D deficiency, unspecified: Secondary | ICD-10-CM | POA: Diagnosis not present

## 2018-06-02 DIAGNOSIS — H539 Unspecified visual disturbance: Secondary | ICD-10-CM | POA: Diagnosis not present

## 2018-06-05 DIAGNOSIS — I6523 Occlusion and stenosis of bilateral carotid arteries: Secondary | ICD-10-CM | POA: Diagnosis not present

## 2018-06-05 DIAGNOSIS — H539 Unspecified visual disturbance: Secondary | ICD-10-CM | POA: Diagnosis not present

## 2018-06-16 ENCOUNTER — Ambulatory Visit
Admission: RE | Admit: 2018-06-16 | Discharge: 2018-06-16 | Disposition: A | Discharge status: Home / Self Care | Payer: PPO | Source: Ambulatory Visit | Attending: Internal Medicine | Admitting: Internal Medicine

## 2018-06-16 DIAGNOSIS — H539 Unspecified visual disturbance: Secondary | ICD-10-CM | POA: Diagnosis not present

## 2018-06-16 DIAGNOSIS — R413 Other amnesia: Secondary | ICD-10-CM | POA: Diagnosis not present

## 2018-06-27 IMAGING — MR MR HEAD W/O CM
8 series · 46 of 48 positions shown · non-contrast
Comparison: None.

CLINICAL DATA: Memory loss.  Expressive aphasia syndrome.

EXAM:
MRI HEAD WITHOUT CONTRAST
TECHNIQUE: Multiplanar, multiecho pulse sequences of the brain and surrounding
structures were obtained without intravenous contrast.

[Series 2: T1 · sagittal · 5.0mm · 0.45mm/px · 3 of 23 slices shown (1 of 2)]
[im 1/23]
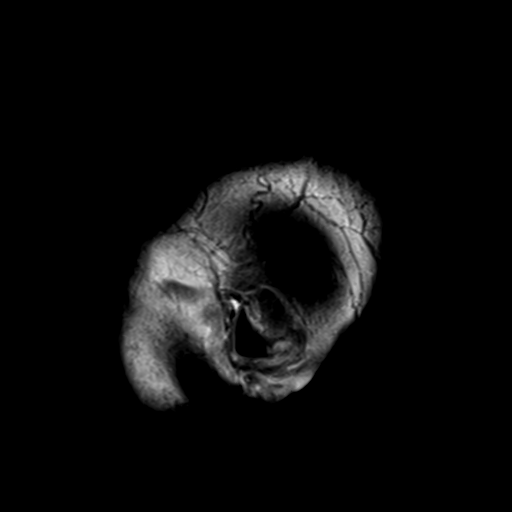
[im 12/23]
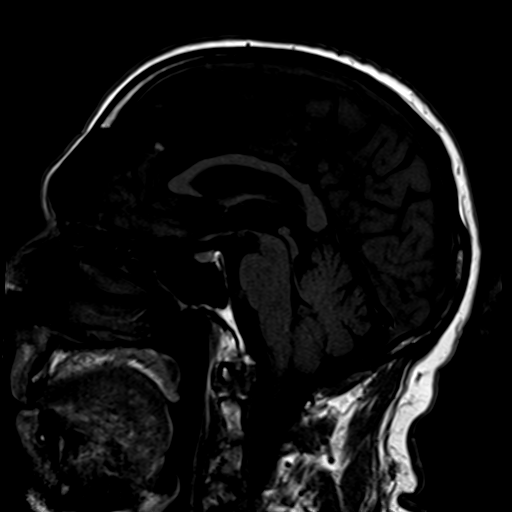
[im 23/23]
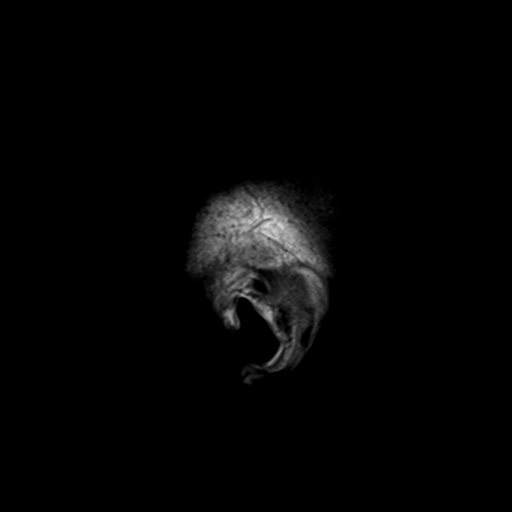

[Series 4: DWI · axial · 3.0mm · 1.20mm/px · z∈[-54,+108]mm · 6 of 56 slices shown (1 of 2)]
[im 1/56]
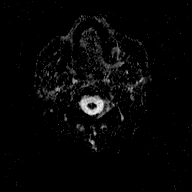
[im 12/56]
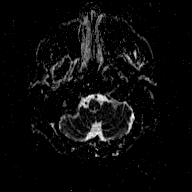
[im 23/56]
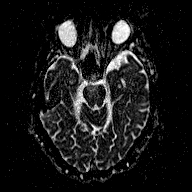
[im 34/56]
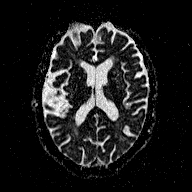
[im 45/56]
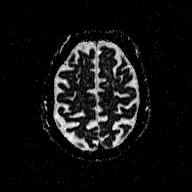
[im 56/56]
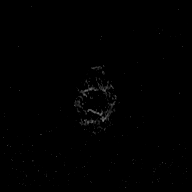

[Series 5: T2 · axial · 5.0mm · 0.72mm/px · z∈[-55,+109]mm · 3 of 25 slices shown (1 of 3)]
[im 1/25]
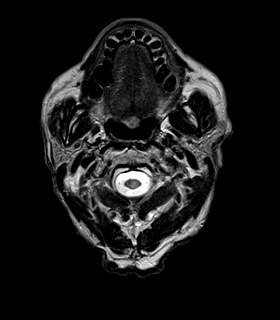
[im 13/25]
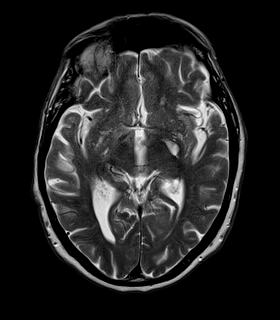
[im 25/25]
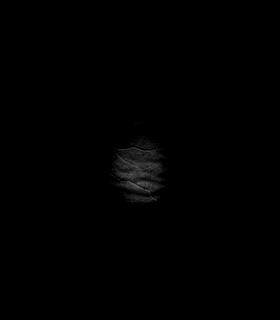

[Series 6: FLAIR · axial · 3.0mm · 0.45mm/px · z∈[-52,+106]mm · 6 of 55 slices shown]
[im 1/55]
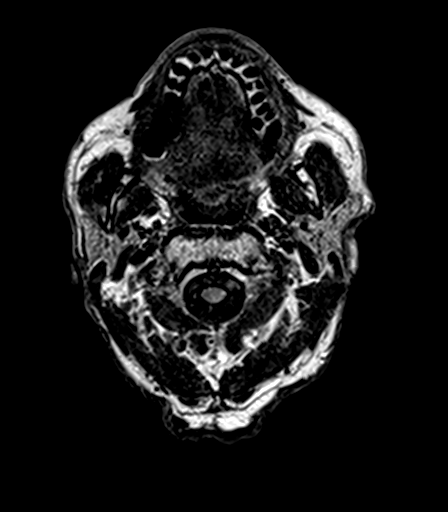
[im 11/55]
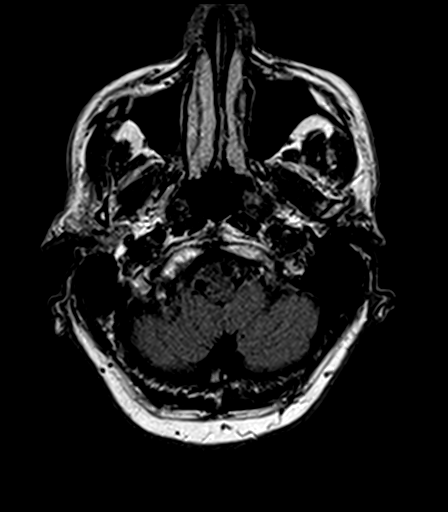
[im 22/55]
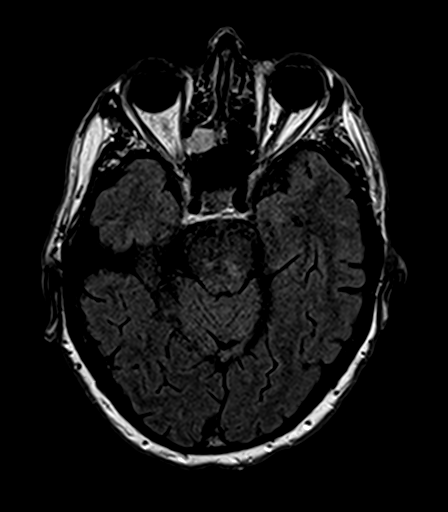
[im 33/55]
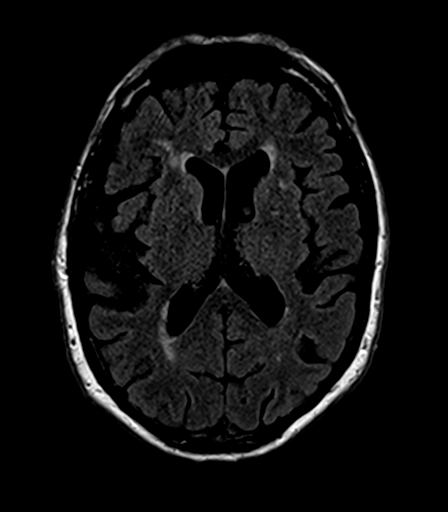
[im 44/55]
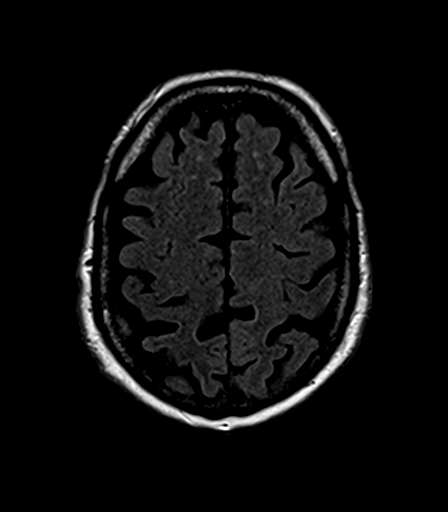
[im 55/55]
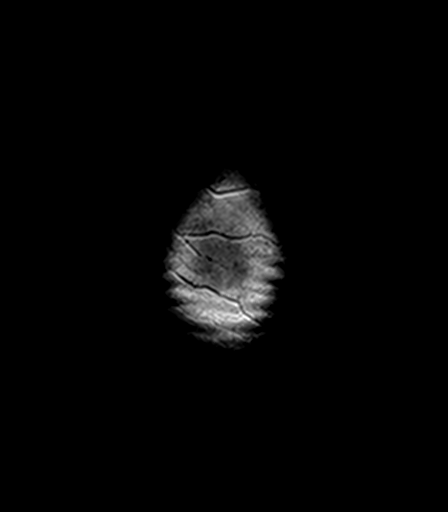

[Series 7: T2 · axial · 5.0mm · 0.72mm/px · z∈[-55,+109]mm · 3 of 25 slices shown (2 of 3)]
[im 1/25]
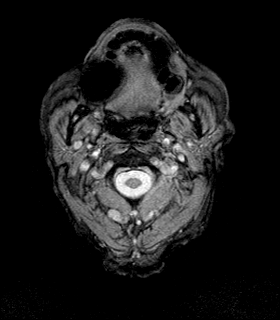
[im 13/25]
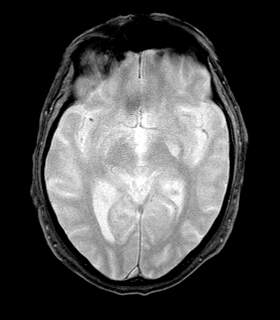
[im 25/25]
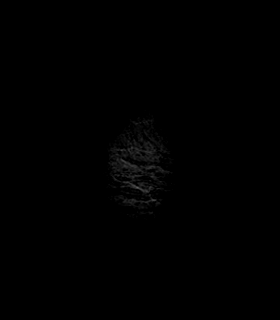

[Series 8: T1 · axial · 1.0mm · 1.00mm/px · z∈[-48,+98]mm · 16 of 160 slices shown (2 of 2)]
[im 1/160]
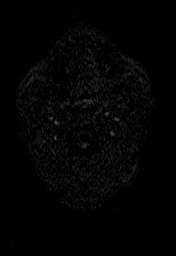
[im 10/160]
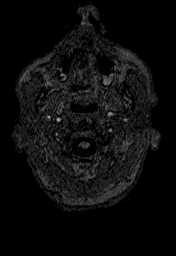
[im 19/160]
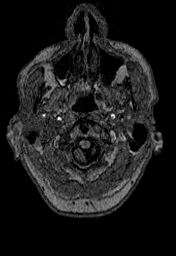
[im 29/160]
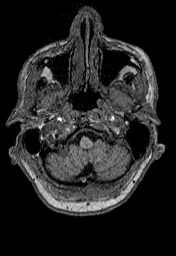
[im 38/160]
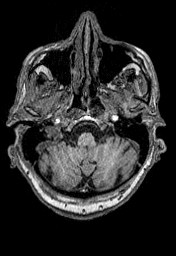
[im 47/160]
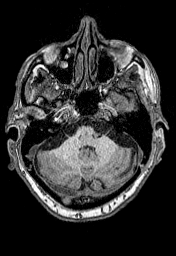
[im 57/160]
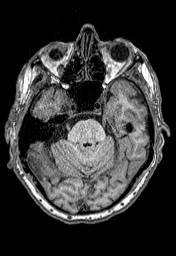
[im 66/160]
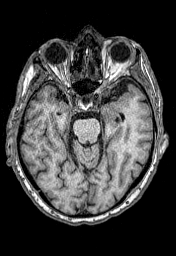
[im 75/160]
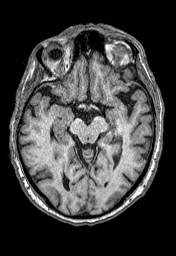
[im 85/160]
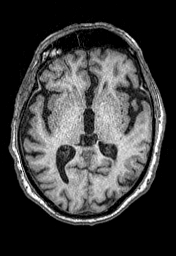
[im 94/160]
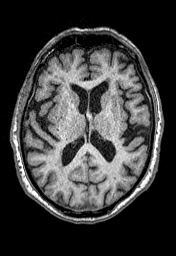
[im 103/160]
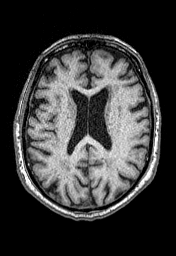
[im 113/160]
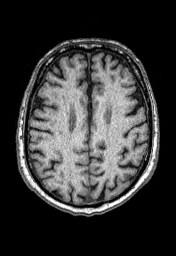
[im 122/160]
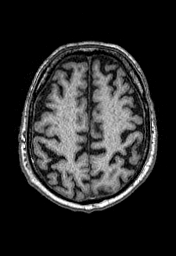
[im 131/160]
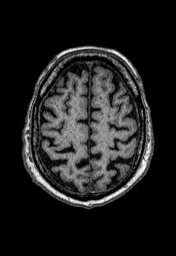
[im 150/160]
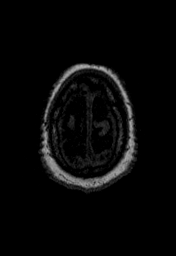

[Series 9: T2 · coronal · 5.0mm · 0.45mm/px · 3 of 29 slices shown (3 of 3)]
[im 1/29]
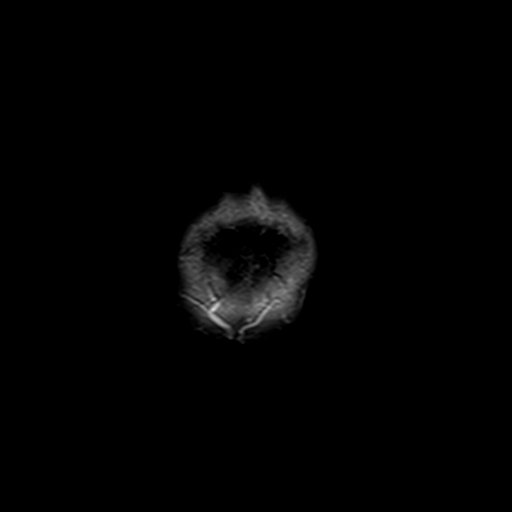
[im 15/29]
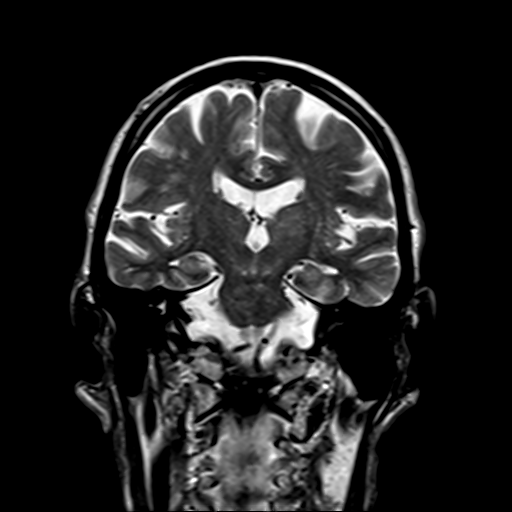
[im 29/29]
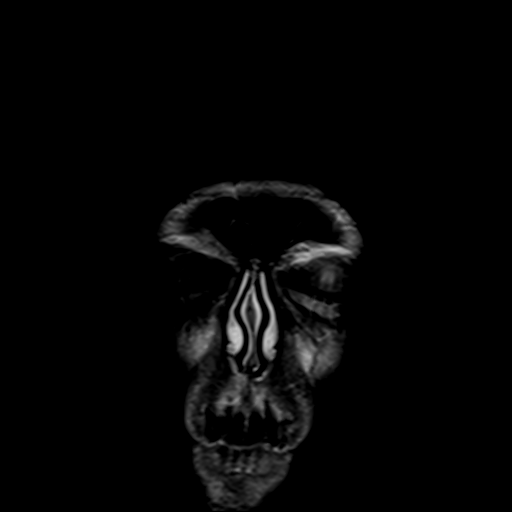

[Series 100: DWI · axial · 3.0mm · 1.20mm/px · z∈[-54,+108]mm · 6 of 56 slices shown (2 of 2)]
[im 1/56]
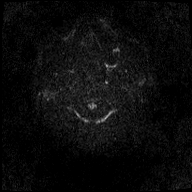
[im 12/56]
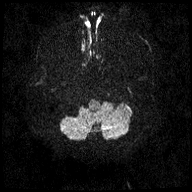
[im 23/56]
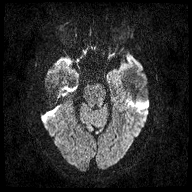
[im 34/56]
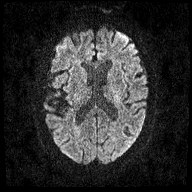
[im 45/56]
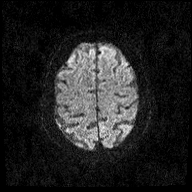
[im 56/56]
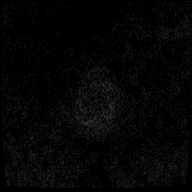

[46 of 48 positions shown; findings below may reference images not displayed]

FINDINGS: Brain: Atrophy and white matter changes are mildly advanced for age.
No acute infarct, hemorrhage, or mass lesion is present. The
ventricles are proportionate to the degree of atrophy. White matter
changes extend into the brainstem. A remote lacunar infarct is noted
in the left cerebellum. Dilated perivascular spaces are present
within the basal ganglia bilaterally. The internal auditory canals
are within normal limits.

Vascular: Flow is present in the major intracranial arteries.

Skull and upper cervical spine: The skullbase is within normal
limits. The craniocervical junction is normal. Degenerative changes
of the upper cervical spine are noted with disc disease at C3-4 and
likely C4-5. The cerebellar tonsils extend to the foramen magnum.

Sinuses/Orbits: Small polyps or mucous retention cysts are present
within the maxillary sinuses bilaterally. No significant mucosal
disease is present otherwise. A posterior right ethmoid air cell is
opacified. There is mild mucosal thickening in the anterior ethmoid
air cells. The frontal sinuses and sphenoid sinuses are clear. The
mastoid air cells are clear. Bilateral lens replacements are
present. The globes and orbits are otherwise within normal limits.
IMPRESSION: 1. No acute or focal abnormality to explain the patient's symptoms.
2. Atrophy and white matter disease is mildly advanced for age. This
likely reflects the sequela of chronic microvascular ischemia.
3. Remote lacunar infarct of the left cerebellum.

## 2018-06-30 DIAGNOSIS — D225 Melanocytic nevi of trunk: Secondary | ICD-10-CM | POA: Diagnosis not present

## 2018-06-30 DIAGNOSIS — D2272 Melanocytic nevi of left lower limb, including hip: Secondary | ICD-10-CM | POA: Diagnosis not present

## 2018-06-30 DIAGNOSIS — Z09 Encounter for follow-up examination after completed treatment for conditions other than malignant neoplasm: Secondary | ICD-10-CM | POA: Diagnosis not present

## 2018-06-30 DIAGNOSIS — X32XXXA Exposure to sunlight, initial encounter: Secondary | ICD-10-CM | POA: Diagnosis not present

## 2018-06-30 DIAGNOSIS — Z872 Personal history of diseases of the skin and subcutaneous tissue: Secondary | ICD-10-CM | POA: Diagnosis not present

## 2018-06-30 DIAGNOSIS — L570 Actinic keratosis: Secondary | ICD-10-CM | POA: Diagnosis not present

## 2018-06-30 DIAGNOSIS — D2262 Melanocytic nevi of left upper limb, including shoulder: Secondary | ICD-10-CM | POA: Diagnosis not present

## 2018-06-30 DIAGNOSIS — D692 Other nonthrombocytopenic purpura: Secondary | ICD-10-CM | POA: Diagnosis not present

## 2018-06-30 DIAGNOSIS — Z85828 Personal history of other malignant neoplasm of skin: Secondary | ICD-10-CM | POA: Diagnosis not present

## 2018-08-18 DIAGNOSIS — I059 Rheumatic mitral valve disease, unspecified: Secondary | ICD-10-CM | POA: Diagnosis not present

## 2018-08-18 DIAGNOSIS — Z9989 Dependence on other enabling machines and devices: Secondary | ICD-10-CM | POA: Diagnosis not present

## 2018-08-18 DIAGNOSIS — I4891 Unspecified atrial fibrillation: Secondary | ICD-10-CM | POA: Diagnosis not present

## 2018-08-18 DIAGNOSIS — G4733 Obstructive sleep apnea (adult) (pediatric): Secondary | ICD-10-CM | POA: Diagnosis not present

## 2018-08-18 DIAGNOSIS — I2510 Atherosclerotic heart disease of native coronary artery without angina pectoris: Secondary | ICD-10-CM | POA: Diagnosis not present

## 2018-09-01 DIAGNOSIS — Z125 Encounter for screening for malignant neoplasm of prostate: Secondary | ICD-10-CM | POA: Diagnosis not present

## 2018-09-01 DIAGNOSIS — I2510 Atherosclerotic heart disease of native coronary artery without angina pectoris: Secondary | ICD-10-CM | POA: Diagnosis not present

## 2018-09-01 DIAGNOSIS — Z79899 Other long term (current) drug therapy: Secondary | ICD-10-CM | POA: Diagnosis not present

## 2018-09-01 DIAGNOSIS — E782 Mixed hyperlipidemia: Secondary | ICD-10-CM | POA: Diagnosis not present

## 2018-10-16 DIAGNOSIS — Z9989 Dependence on other enabling machines and devices: Secondary | ICD-10-CM | POA: Diagnosis not present

## 2018-10-16 DIAGNOSIS — G4733 Obstructive sleep apnea (adult) (pediatric): Secondary | ICD-10-CM | POA: Diagnosis not present

## 2018-10-16 DIAGNOSIS — G3184 Mild cognitive impairment, so stated: Secondary | ICD-10-CM | POA: Diagnosis not present

## 2018-10-16 DIAGNOSIS — I482 Chronic atrial fibrillation, unspecified: Secondary | ICD-10-CM | POA: Diagnosis not present

## 2018-10-16 DIAGNOSIS — Z8673 Personal history of transient ischemic attack (TIA), and cerebral infarction without residual deficits: Secondary | ICD-10-CM | POA: Diagnosis not present

## 2018-10-30 DIAGNOSIS — Z8673 Personal history of transient ischemic attack (TIA), and cerebral infarction without residual deficits: Secondary | ICD-10-CM | POA: Diagnosis not present

## 2018-10-30 DIAGNOSIS — G4733 Obstructive sleep apnea (adult) (pediatric): Secondary | ICD-10-CM | POA: Diagnosis not present

## 2018-10-30 DIAGNOSIS — I4891 Unspecified atrial fibrillation: Secondary | ICD-10-CM | POA: Diagnosis not present

## 2018-10-30 DIAGNOSIS — Z9989 Dependence on other enabling machines and devices: Secondary | ICD-10-CM | POA: Diagnosis not present

## 2018-11-07 DIAGNOSIS — G4733 Obstructive sleep apnea (adult) (pediatric): Secondary | ICD-10-CM | POA: Diagnosis not present

## 2018-12-06 DIAGNOSIS — G4733 Obstructive sleep apnea (adult) (pediatric): Secondary | ICD-10-CM | POA: Diagnosis not present

## 2018-12-11 DIAGNOSIS — Z0000 Encounter for general adult medical examination without abnormal findings: Secondary | ICD-10-CM | POA: Diagnosis not present

## 2018-12-11 DIAGNOSIS — Z79899 Other long term (current) drug therapy: Secondary | ICD-10-CM | POA: Diagnosis not present

## 2018-12-11 DIAGNOSIS — R7309 Other abnormal glucose: Secondary | ICD-10-CM | POA: Diagnosis not present

## 2018-12-11 DIAGNOSIS — G720 Drug-induced myopathy: Secondary | ICD-10-CM | POA: Diagnosis not present

## 2018-12-11 DIAGNOSIS — T466X5A Adverse effect of antihyperlipidemic and antiarteriosclerotic drugs, initial encounter: Secondary | ICD-10-CM | POA: Diagnosis not present

## 2018-12-11 DIAGNOSIS — I4891 Unspecified atrial fibrillation: Secondary | ICD-10-CM | POA: Diagnosis not present

## 2018-12-11 DIAGNOSIS — G4733 Obstructive sleep apnea (adult) (pediatric): Secondary | ICD-10-CM | POA: Diagnosis not present

## 2018-12-11 DIAGNOSIS — Z9989 Dependence on other enabling machines and devices: Secondary | ICD-10-CM | POA: Diagnosis not present

## 2018-12-11 DIAGNOSIS — E782 Mixed hyperlipidemia: Secondary | ICD-10-CM | POA: Diagnosis not present

## 2018-12-11 DIAGNOSIS — Z1211 Encounter for screening for malignant neoplasm of colon: Secondary | ICD-10-CM | POA: Diagnosis not present

## 2018-12-11 DIAGNOSIS — E559 Vitamin D deficiency, unspecified: Secondary | ICD-10-CM | POA: Diagnosis not present

## 2018-12-26 DIAGNOSIS — Z1211 Encounter for screening for malignant neoplasm of colon: Secondary | ICD-10-CM | POA: Diagnosis not present

## 2018-12-26 DIAGNOSIS — I4892 Unspecified atrial flutter: Secondary | ICD-10-CM | POA: Diagnosis not present

## 2018-12-26 DIAGNOSIS — D692 Other nonthrombocytopenic purpura: Secondary | ICD-10-CM | POA: Diagnosis not present

## 2018-12-26 DIAGNOSIS — G4700 Insomnia, unspecified: Secondary | ICD-10-CM | POA: Diagnosis not present

## 2018-12-26 DIAGNOSIS — Z125 Encounter for screening for malignant neoplasm of prostate: Secondary | ICD-10-CM | POA: Diagnosis not present

## 2018-12-26 DIAGNOSIS — I2510 Atherosclerotic heart disease of native coronary artery without angina pectoris: Secondary | ICD-10-CM | POA: Diagnosis not present

## 2018-12-26 DIAGNOSIS — I5040 Unspecified combined systolic (congestive) and diastolic (congestive) heart failure: Secondary | ICD-10-CM | POA: Diagnosis not present

## 2018-12-26 DIAGNOSIS — E1169 Type 2 diabetes mellitus with other specified complication: Secondary | ICD-10-CM | POA: Diagnosis not present

## 2018-12-26 DIAGNOSIS — Z1389 Encounter for screening for other disorder: Secondary | ICD-10-CM | POA: Diagnosis not present

## 2018-12-26 DIAGNOSIS — I129 Hypertensive chronic kidney disease with stage 1 through stage 4 chronic kidney disease, or unspecified chronic kidney disease: Secondary | ICD-10-CM | POA: Diagnosis not present

## 2018-12-26 DIAGNOSIS — E7800 Pure hypercholesterolemia, unspecified: Secondary | ICD-10-CM | POA: Diagnosis not present

## 2018-12-26 DIAGNOSIS — Z0000 Encounter for general adult medical examination without abnormal findings: Secondary | ICD-10-CM | POA: Diagnosis not present

## 2018-12-26 DIAGNOSIS — N183 Chronic kidney disease, stage 3 unspecified: Secondary | ICD-10-CM | POA: Diagnosis not present

## 2019-01-06 DIAGNOSIS — G4733 Obstructive sleep apnea (adult) (pediatric): Secondary | ICD-10-CM | POA: Diagnosis not present

## 2019-01-19 DIAGNOSIS — G4733 Obstructive sleep apnea (adult) (pediatric): Secondary | ICD-10-CM | POA: Diagnosis not present

## 2019-02-05 DIAGNOSIS — G4733 Obstructive sleep apnea (adult) (pediatric): Secondary | ICD-10-CM | POA: Diagnosis not present

## 2019-02-11 DIAGNOSIS — I2510 Atherosclerotic heart disease of native coronary artery without angina pectoris: Secondary | ICD-10-CM | POA: Diagnosis not present

## 2019-02-11 DIAGNOSIS — R079 Chest pain, unspecified: Secondary | ICD-10-CM | POA: Diagnosis not present

## 2019-02-11 DIAGNOSIS — G4733 Obstructive sleep apnea (adult) (pediatric): Secondary | ICD-10-CM | POA: Diagnosis not present

## 2019-02-11 DIAGNOSIS — G720 Drug-induced myopathy: Secondary | ICD-10-CM | POA: Diagnosis not present

## 2019-02-11 DIAGNOSIS — Z9989 Dependence on other enabling machines and devices: Secondary | ICD-10-CM | POA: Diagnosis not present

## 2019-02-11 DIAGNOSIS — I059 Rheumatic mitral valve disease, unspecified: Secondary | ICD-10-CM | POA: Diagnosis not present

## 2019-02-11 DIAGNOSIS — T466X5A Adverse effect of antihyperlipidemic and antiarteriosclerotic drugs, initial encounter: Secondary | ICD-10-CM | POA: Diagnosis not present

## 2019-02-11 DIAGNOSIS — I4891 Unspecified atrial fibrillation: Secondary | ICD-10-CM | POA: Diagnosis not present

## 2019-02-11 DIAGNOSIS — I739 Peripheral vascular disease, unspecified: Secondary | ICD-10-CM | POA: Diagnosis not present

## 2019-02-24 DIAGNOSIS — R079 Chest pain, unspecified: Secondary | ICD-10-CM | POA: Diagnosis not present

## 2019-03-02 DIAGNOSIS — G4733 Obstructive sleep apnea (adult) (pediatric): Secondary | ICD-10-CM | POA: Diagnosis not present

## 2019-03-03 DIAGNOSIS — I739 Peripheral vascular disease, unspecified: Secondary | ICD-10-CM | POA: Diagnosis not present

## 2019-03-03 DIAGNOSIS — I4891 Unspecified atrial fibrillation: Secondary | ICD-10-CM | POA: Diagnosis not present

## 2019-03-08 DIAGNOSIS — G4733 Obstructive sleep apnea (adult) (pediatric): Secondary | ICD-10-CM | POA: Diagnosis not present

## 2019-03-31 DIAGNOSIS — X32XXXA Exposure to sunlight, initial encounter: Secondary | ICD-10-CM | POA: Diagnosis not present

## 2019-03-31 DIAGNOSIS — D2261 Melanocytic nevi of right upper limb, including shoulder: Secondary | ICD-10-CM | POA: Diagnosis not present

## 2019-03-31 DIAGNOSIS — L718 Other rosacea: Secondary | ICD-10-CM | POA: Diagnosis not present

## 2019-03-31 DIAGNOSIS — L821 Other seborrheic keratosis: Secondary | ICD-10-CM | POA: Diagnosis not present

## 2019-03-31 DIAGNOSIS — Z85828 Personal history of other malignant neoplasm of skin: Secondary | ICD-10-CM | POA: Diagnosis not present

## 2019-03-31 DIAGNOSIS — D2272 Melanocytic nevi of left lower limb, including hip: Secondary | ICD-10-CM | POA: Diagnosis not present

## 2019-03-31 DIAGNOSIS — L570 Actinic keratosis: Secondary | ICD-10-CM | POA: Diagnosis not present

## 2019-03-31 DIAGNOSIS — D2262 Melanocytic nevi of left upper limb, including shoulder: Secondary | ICD-10-CM | POA: Diagnosis not present

## 2019-04-07 DIAGNOSIS — G4733 Obstructive sleep apnea (adult) (pediatric): Secondary | ICD-10-CM | POA: Diagnosis not present

## 2019-05-08 DIAGNOSIS — G4733 Obstructive sleep apnea (adult) (pediatric): Secondary | ICD-10-CM | POA: Diagnosis not present

## 2019-05-26 DIAGNOSIS — G4733 Obstructive sleep apnea (adult) (pediatric): Secondary | ICD-10-CM | POA: Diagnosis not present

## 2019-05-26 DIAGNOSIS — Z9989 Dependence on other enabling machines and devices: Secondary | ICD-10-CM | POA: Diagnosis not present

## 2019-05-26 DIAGNOSIS — Z79899 Other long term (current) drug therapy: Secondary | ICD-10-CM | POA: Diagnosis not present

## 2019-05-26 DIAGNOSIS — I4891 Unspecified atrial fibrillation: Secondary | ICD-10-CM | POA: Diagnosis not present

## 2019-05-26 DIAGNOSIS — T466X5A Adverse effect of antihyperlipidemic and antiarteriosclerotic drugs, initial encounter: Secondary | ICD-10-CM | POA: Diagnosis not present

## 2019-05-26 DIAGNOSIS — E782 Mixed hyperlipidemia: Secondary | ICD-10-CM | POA: Diagnosis not present

## 2019-05-26 DIAGNOSIS — Z0000 Encounter for general adult medical examination without abnormal findings: Secondary | ICD-10-CM | POA: Diagnosis not present

## 2019-05-26 DIAGNOSIS — G720 Drug-induced myopathy: Secondary | ICD-10-CM | POA: Diagnosis not present

## 2019-06-08 DIAGNOSIS — G4733 Obstructive sleep apnea (adult) (pediatric): Secondary | ICD-10-CM | POA: Diagnosis not present

## 2019-06-11 DIAGNOSIS — Z961 Presence of intraocular lens: Secondary | ICD-10-CM | POA: Diagnosis not present

## 2019-06-12 DIAGNOSIS — Z23 Encounter for immunization: Secondary | ICD-10-CM | POA: Diagnosis not present

## 2019-07-01 DIAGNOSIS — I4891 Unspecified atrial fibrillation: Secondary | ICD-10-CM | POA: Diagnosis not present

## 2019-07-01 DIAGNOSIS — N3001 Acute cystitis with hematuria: Secondary | ICD-10-CM | POA: Diagnosis not present

## 2019-07-01 DIAGNOSIS — I959 Hypotension, unspecified: Secondary | ICD-10-CM | POA: Diagnosis not present

## 2019-07-01 DIAGNOSIS — R300 Dysuria: Secondary | ICD-10-CM | POA: Diagnosis not present

## 2019-07-08 DIAGNOSIS — G4733 Obstructive sleep apnea (adult) (pediatric): Secondary | ICD-10-CM | POA: Diagnosis not present

## 2019-07-27 DIAGNOSIS — M4726 Other spondylosis with radiculopathy, lumbar region: Secondary | ICD-10-CM | POA: Diagnosis not present

## 2019-07-27 DIAGNOSIS — M5432 Sciatica, left side: Secondary | ICD-10-CM | POA: Diagnosis not present

## 2019-07-27 DIAGNOSIS — M47816 Spondylosis without myelopathy or radiculopathy, lumbar region: Secondary | ICD-10-CM | POA: Diagnosis not present

## 2019-08-05 DIAGNOSIS — M5416 Radiculopathy, lumbar region: Secondary | ICD-10-CM | POA: Diagnosis not present

## 2019-08-06 ENCOUNTER — Other Ambulatory Visit: Payer: Self-pay | Admitting: Neurosurgery

## 2019-08-06 DIAGNOSIS — M5127 Other intervertebral disc displacement, lumbosacral region: Secondary | ICD-10-CM | POA: Diagnosis not present

## 2019-08-06 DIAGNOSIS — M7138 Other bursal cyst, other site: Secondary | ICD-10-CM | POA: Diagnosis not present

## 2019-08-06 DIAGNOSIS — M5416 Radiculopathy, lumbar region: Secondary | ICD-10-CM | POA: Diagnosis not present

## 2019-08-06 DIAGNOSIS — M4807 Spinal stenosis, lumbosacral region: Secondary | ICD-10-CM | POA: Diagnosis not present

## 2019-08-08 DIAGNOSIS — G4733 Obstructive sleep apnea (adult) (pediatric): Secondary | ICD-10-CM | POA: Diagnosis not present

## 2019-08-10 ENCOUNTER — Other Ambulatory Visit: Payer: PPO

## 2019-08-11 ENCOUNTER — Other Ambulatory Visit: Payer: Self-pay

## 2019-08-11 ENCOUNTER — Other Ambulatory Visit
Admission: RE | Admit: 2019-08-11 | Discharge: 2019-08-11 | Disposition: A | Discharge status: Home / Self Care | Payer: PPO | Source: Ambulatory Visit | Attending: Neurosurgery | Admitting: Neurosurgery

## 2019-08-11 DIAGNOSIS — Z20828 Contact with and (suspected) exposure to other viral communicable diseases: Secondary | ICD-10-CM | POA: Diagnosis not present

## 2019-08-11 DIAGNOSIS — Z01812 Encounter for preprocedural laboratory examination: Secondary | ICD-10-CM | POA: Diagnosis not present

## 2019-08-11 DIAGNOSIS — I4891 Unspecified atrial fibrillation: Secondary | ICD-10-CM | POA: Diagnosis not present

## 2019-08-11 DIAGNOSIS — I059 Rheumatic mitral valve disease, unspecified: Secondary | ICD-10-CM | POA: Diagnosis not present

## 2019-08-11 DIAGNOSIS — Z9989 Dependence on other enabling machines and devices: Secondary | ICD-10-CM | POA: Diagnosis not present

## 2019-08-11 DIAGNOSIS — G4733 Obstructive sleep apnea (adult) (pediatric): Secondary | ICD-10-CM | POA: Diagnosis not present

## 2019-08-11 DIAGNOSIS — I2510 Atherosclerotic heart disease of native coronary artery without angina pectoris: Secondary | ICD-10-CM | POA: Diagnosis not present

## 2019-08-11 LAB — SARS CORONAVIRUS 2 (TAT 6-24 HRS): SARS Coronavirus 2: NEGATIVE

## 2019-08-11 NOTE — Progress Notes (Signed)
[id number] - [place], [place] - [address] [place] [address] Phone: [phone] Fax: [phone]      Your procedure is scheduled on November 20  Report to [facility] Main Entrance "A" at [place].M., and check in at the Admitting office.  Call this number if you have problems the morning of surgery:  [phone]  Call [phone] if you have any questions prior to your surgery date Monday-Friday 8am-4pm    Remember:  Do not eat or drink after midnight the night before your surgery    Take these medicines the morning of surgery with A SIP OF WATER  colesevelam [facility]) digoxin (LANOXIN) metoprolol tartrate (LOPRESSOR) mirabegron ER (MYRBETRIQ)   Follow your surgeon's instructions on when to stop Eliquis.  If no instructions were given by your surgeon then you will need to call the office to get those instructions.    7 days prior to surgery STOP taking any Aspirin (unless otherwise instructed by your surgeon), Aleve, Naproxen, Ibuprofen, Motrin, Advil, Goody's, BC's, all herbal medications, fish oil, and all vitamins.    The Morning of Surgery  Do not wear jewelry  Do not wear lotions, powders, or colognes, or deodorant   Men may shave face and neck.  Do not bring valuables to the hospital.  [facility] is not responsible for any belongings or valuables.  If you are a smoker, DO NOT Smoke 24 hours prior to surgery  If you wear a CPAP at night please bring your mask, tubing, and machine the morning of surgery   Remember that you must have someone to transport you home after your surgery, and remain with you for 24 hours if you are discharged the same day.   Please bring cases for contacts, glasses, hearing aids, dentures or bridgework because it cannot be worn into surgery.    Leave your suitcase in the car.  After surgery it may be brought to your room.  For patients admitted to the hospital, discharge time will be determined by your treatment  team.  Patients discharged the day of surgery will not be allowed to drive home.    Special instructions:   [place]- Preparing For Surgery  Before surgery, you can play an important role. Because skin is not sterile, your skin needs to be as free of germs as possible. You can reduce the number of germs on your skin by washing with CHG (chlorahexidine gluconate) Soap before surgery.  CHG is an antiseptic cleaner which kills germs and bonds with the skin to continue killing germs even after washing.    Oral Hygiene is also important to reduce your risk of infection.  Remember - BRUSH YOUR TEETH THE MORNING OF SURGERY WITH YOUR REGULAR TOOTHPASTE  Please do not use if you have an allergy to CHG or antibacterial soaps. If your skin becomes reddened/irritated stop using the CHG.  Do not shave (including legs and underarms) for at least 48 hours prior to first CHG shower. It is OK to shave your face.  Please follow these instructions carefully.   1. Shower the NIGHT BEFORE SURGERY and the MORNING OF SURGERY with CHG Soap.   2. If you chose to wash your hair, wash your hair first as usual with your normal shampoo.  3. After you shampoo, rinse your hair and body thoroughly to remove the shampoo.  4. Use CHG as you would any other liquid soap. You can apply CHG directly to the skin  and wash gently with a scrungie or a clean washcloth.   5. Apply the CHG Soap to your body ONLY FROM THE NECK DOWN.  Do not use on open wounds or open sores. Avoid contact with your eyes, ears, mouth and genitals (private parts). Wash Face and genitals (private parts)  with your normal soap.   6. Wash thoroughly, paying special attention to the area where your surgery will be performed.  7. Thoroughly rinse your body with warm water from the neck down.  8. DO NOT shower/wash with your normal soap after using and rinsing off the CHG Soap.  9. Pat yourself dry with a CLEAN TOWEL.  10. Wear CLEAN PAJAMAS to bed  the night before surgery, wear comfortable clothes the morning of surgery  11. Place CLEAN SHEETS on your bed the night of your first shower and DO NOT SLEEP WITH PETS.    Day of Surgery:  Please shower the morning of surgery with the CHG soap Do not apply any deodorants/lotions. Please wear clean clothes to the hospital/surgery center.   Remember to brush your teeth WITH YOUR REGULAR TOOTHPASTE.   Please read over the following fact sheets that you were given.

## 2019-08-12 ENCOUNTER — Other Ambulatory Visit: Payer: Self-pay

## 2019-08-12 ENCOUNTER — Encounter (HOSPITAL_COMMUNITY): Payer: Self-pay

## 2019-08-12 ENCOUNTER — Encounter (HOSPITAL_COMMUNITY)
Admission: RE | Admit: 2019-08-12 | Discharge: 2019-08-12 | Disposition: A | Discharge status: Home / Self Care | Payer: PPO | Source: Ambulatory Visit | Attending: Neurosurgery | Admitting: Neurosurgery

## 2019-08-12 DIAGNOSIS — Z01812 Encounter for preprocedural laboratory examination: Secondary | ICD-10-CM | POA: Diagnosis not present

## 2019-08-12 DIAGNOSIS — I252 Old myocardial infarction: Secondary | ICD-10-CM | POA: Insufficient documentation

## 2019-08-12 DIAGNOSIS — I4891 Unspecified atrial fibrillation: Secondary | ICD-10-CM | POA: Diagnosis not present

## 2019-08-12 DIAGNOSIS — Z7901 Long term (current) use of anticoagulants: Secondary | ICD-10-CM | POA: Diagnosis not present

## 2019-08-12 HISTORY — DX: Unspecified osteoarthritis, unspecified site: M19.90

## 2019-08-12 HISTORY — DX: Sleep apnea, unspecified: G47.30

## 2019-08-12 HISTORY — DX: Malignant (primary) neoplasm, unspecified: C80.1

## 2019-08-12 HISTORY — DX: Cardiac arrhythmia, unspecified: I49.9

## 2019-08-12 LAB — SURGICAL PCR SCREEN
MRSA, PCR: NEGATIVE
Staphylococcus aureus: NEGATIVE

## 2019-08-12 LAB — CBC WITH DIFFERENTIAL/PLATELET
Abs Immature Granulocytes: 0.02 10*3/uL (ref 0.00–0.07)
Basophils Absolute: 0 10*3/uL (ref 0.0–0.1)
Basophils Relative: 1 %
Eosinophils Absolute: 0.1 10*3/uL (ref 0.0–0.5)
Eosinophils Relative: 2 %
HCT: 41.4 % (ref 39.0–52.0)
Hemoglobin: 13.7 g/dL (ref 13.0–17.0)
Immature Granulocytes: 0 %
Lymphocytes Relative: 12 %
Lymphs Abs: 0.8 10*3/uL (ref 0.7–4.0)
MCH: 33.2 pg (ref 26.0–34.0)
MCHC: 33.1 g/dL (ref 30.0–36.0)
MCV: 100.2 fL — ABNORMAL HIGH (ref 80.0–100.0)
Monocytes Absolute: 0.6 10*3/uL (ref 0.1–1.0)
Monocytes Relative: 10 %
Neutro Abs: 5 10*3/uL (ref 1.7–7.7)
Neutrophils Relative %: 75 %
Platelets: 157 10*3/uL (ref 150–400)
RBC: 4.13 MIL/uL — ABNORMAL LOW (ref 4.22–5.81)
RDW: 12 % (ref 11.5–15.5)
WBC: 6.6 10*3/uL (ref 4.0–10.5)
nRBC: 0 % (ref 0.0–0.2)

## 2019-08-12 LAB — BASIC METABOLIC PANEL
Anion gap: 8 (ref 5–15)
BUN: 17 mg/dL (ref 8–23)
CO2: 27 mmol/L (ref 22–32)
Calcium: 9.5 mg/dL (ref 8.9–10.3)
Chloride: 100 mmol/L (ref 98–111)
Creatinine, Ser: 1.11 mg/dL (ref 0.61–1.24)
GFR calc Af Amer: >60 mL/min (ref >60)
GFR calc non Af Amer: >60 mL/min (ref >60)
Glucose, Bld: 109 mg/dL — ABNORMAL HIGH (ref 70–99)
Potassium: 5.1 mmol/L (ref 3.5–5.1)
Sodium: 135 mmol/L (ref 135–145)

## 2019-08-12 NOTE — Progress Notes (Signed)
PCP - Virgil Benedict  Cardiologist -Fath   PPM/ICD - denies  Device Orders -  Rep Notified -   Chest x-ray - n/a EKG - 02/11/2019 Stress Test - 02/2019 ECHO -02/2019  Cardiac Cath - 2016  Sleep Study -10 yrs. + ago  CPAP - yes, most nights   Fasting Blood Sugar - n/a Checks Blood Sugar _____ times a day  Blood Thinner Instructions:Eliquis on hold, per Dr. Bethanne Ginger instruction Aspirin Instructions:denies taking   ERAS Protcol -n/a PRE-SURGERY Ensure or G2-   COVID TEST- negative, done 08/11/2019   Anesthesia review: yes- for cardiac history  Have requested  via fax to have the most recent EKG tracing faxed to [facility]  Patient denies shortness of breath, fever, cough and chest pain at PAT appointment   All instructions explained to the patient, with a verbal understanding of the material. Patient agrees to go over the instructions while at home for a better understanding. Patient also instructed to self quarantine after being tested for COVID-19. The opportunity to ask questions was provided.

## 2019-08-13 NOTE — Progress Notes (Signed)
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 Last seen by Dr. Ubaldo Glassing 08/11/19 and cleared for surgery. Per note, "Patient has a history of atrial fibrillation and has been cardioverted with intermittent paroxysmal atrial fibrillation occasionally with rapid ventricular response. This has improved since he was placed on digoxin. 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 He has occasional chest discomfort. He is compliant medications. He denies syncope or presyncope. Marland Kitchen His most recent ischemic and structural evaluation was in June of this year. There was no appreciable change. He is undergoing back surgery on Friday. He will stop Eliquis 4 days prior to the procedure. Would resume it soon as possible post procedure. Is optimized from a cardiac standpoint for surgery."  Preop labs reviewed, WNL.  EKG 02/11/19 (copy on chart): Afib with PVC or aberrant complexes. Nonspecific intraventricular block. Ventricular rate 91. No significant change.   TTE 03/03/19 (care everywhere): INTERPRETATION SEVERE LV SYSTOLIC DYSFUNCTION (See above) NORMAL RIGHT VENTRICULAR SYSTOLIC FUNCTION MILD VALVULAR REGURGITATION (See above) NO VALVULAR STENOSIS Closest EF: 25% (Estimated) Size: MILDLY ENLARGED Tricuspid: MILD TR Mobility: PARTIALLY MOBILE Morphology: PROSTHETIC RING Contraction: SEVERE GLOBAL DECREASE Mitral: MILD MR Aortic: TRIVIAL AR  Nuclear stress 02/25/19 (care everywhere): FINDINGS: Regional wall motion: demonstrates hypokinesis of the Diffusely. The overall quality of the study is good.  Artifacts noted: yes Left ventricular cavity: enlarged.  Perfusion Analysis: SPECT images demonstrate homogeneous tracer  distribution throughout the myocardium. Defect type:  Fixed  Good exercise tolerance despite reduced LV function. EF 20%. Global  hypokinesis. No reversible ischemia. No appreciable change from previous  Study.     Wynonia Musty [facility] Short Stay Center/Anesthesiology Phone [phone] 08/13/2019 12:02 PM

## 2019-08-13 NOTE — Anesthesia Preprocedure Evaluation (Addendum)
Anesthesia Evaluation  Patient identified by MRN, date of birth, ID band  Reviewed: Allergy & Precautions, NPO status , Patient's Chart, lab work & pertinent test results  Airway Mallampati: II  TM Distance: >3 FB Neck ROM: Full    Dental no notable dental hx. (+) Teeth Intact   Pulmonary sleep apnea ,    Pulmonary exam normal breath sounds clear to auscultation       Cardiovascular + CAD and + Past MI  Normal cardiovascular exam+ dysrhythmias Atrial Fibrillation  Rhythm:Regular Rate:Normal  EF 20-30%   Neuro/Psych CVA    GI/Hepatic negative GI ROS, Neg liver ROS,   Endo/Other  negative endocrine ROS  Renal/GU Cr 1.11 K+ 5.1     Musculoskeletal  (+) Arthritis ,   Abdominal   Peds  Hematology Hgb13.7 Plt 157   Anesthesia Other Findings   Reproductive/Obstetrics                             Anesthesia Physical Anesthesia Plan  ASA: IV  Anesthesia Plan: General   Post-op Pain Management:    Induction: Intravenous  PONV Risk Score and Plan: 3 and Treatment may vary due to age or medical condition, Ondansetron and Dexamethasone  Airway Management Planned: Oral ETT  Additional Equipment: None  Intra-op Plan:   Post-operative Plan: Extubation in OR  Informed Consent: I have reviewed the patients History and Physical, chart, labs and discussed the procedure including the risks, benefits and alternatives for the proposed anesthesia with the patient or authorized representative who has indicated his/her understanding and acceptance.     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 Last seen by Dr. Ubaldo Glassing 08/11/19 and cleared for surgery. Per note, "Patient has a history of atrial fibrillation and has been cardioverted with intermittent paroxysmal atrial fibrillation  occasionally with rapid ventricular response. This has improved since he was placed on digoxin. 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 He has occasional chest discomfort. He is compliant medications. He denies syncope or presyncope. Marland Kitchen His most recent ischemic and structural evaluation was in June of this year. There was no appreciable change. He is undergoing back surgery on Friday. He will stop Eliquis 4 days prior to the procedure. Would resume it soon as possible post procedure. Is optimized from a cardiac standpoint for surgery."  Preop labs reviewed, WNL.  EKG 02/11/19 (copy on chart): Afib with PVC or aberrant complexes. Nonspecific intraventricular block. Ventricular rate 91. No significant change.   TTE 03/03/19 (care everywhere): INTERPRETATION SEVERE LV SYSTOLIC DYSFUNCTION (See above) NORMAL RIGHT VENTRICULAR SYSTOLIC FUNCTION MILD VALVULAR REGURGITATION (See above) NO VALVULAR STENOSIS Closest EF: 25% (Estimated) Size: MILDLY ENLARGED Tricuspid: MILD TR Mobility: PARTIALLY MOBILE Morphology: PROSTHETIC RING Contraction: SEVERE GLOBAL DECREASE Mitral: MILD MR Aortic: TRIVIAL AR  Nuclear stress 02/25/19 (care everywhere): FINDINGS: Regional wall motion: demonstrates hypokinesis of the Diffusely. The overall quality of the study is good.  Artifacts noted: yes Left ventricular cavity: enlarged.  Perfusion Analysis: SPECT images demonstrate homogeneous tracer  distribution throughout the myocardium. Defect type: Fixed  Good exercise tolerance despite reduced LV function. EF 20%. Global  hypokinesis. No reversible ischemia. No appreciable change from previous  Study.)       Anesthesia Quick Evaluation

## 2019-08-14 ENCOUNTER — Encounter (HOSPITAL_COMMUNITY): Payer: Self-pay

## 2019-08-14 ENCOUNTER — Ambulatory Visit (HOSPITAL_COMMUNITY): Payer: PPO | Admitting: Physician Assistant

## 2019-08-14 ENCOUNTER — Encounter (HOSPITAL_COMMUNITY)
Admission: RE | Disposition: A | Discharge status: Home / Self Care | Payer: Self-pay | Source: Home / Self Care | Attending: Neurosurgery

## 2019-08-14 ENCOUNTER — Observation Stay (HOSPITAL_COMMUNITY)
Admission: RE | Admit: 2019-08-14 | Discharge: 2019-08-14 | Disposition: A | Discharge status: Home / Self Care | Payer: PPO | Attending: Neurosurgery | Admitting: Neurosurgery

## 2019-08-14 ENCOUNTER — Ambulatory Visit (HOSPITAL_COMMUNITY): Payer: PPO | Admitting: Anesthesiology

## 2019-08-14 ENCOUNTER — Ambulatory Visit (HOSPITAL_COMMUNITY): Payer: PPO

## 2019-08-14 ENCOUNTER — Other Ambulatory Visit: Payer: Self-pay

## 2019-08-14 DIAGNOSIS — G4730 Sleep apnea, unspecified: Secondary | ICD-10-CM | POA: Insufficient documentation

## 2019-08-14 DIAGNOSIS — M48061 Spinal stenosis, lumbar region without neurogenic claudication: Secondary | ICD-10-CM | POA: Diagnosis not present

## 2019-08-14 DIAGNOSIS — M7138 Other bursal cyst, other site: Secondary | ICD-10-CM | POA: Diagnosis not present

## 2019-08-14 DIAGNOSIS — M1990 Unspecified osteoarthritis, unspecified site: Secondary | ICD-10-CM | POA: Insufficient documentation

## 2019-08-14 DIAGNOSIS — Z981 Arthrodesis status: Secondary | ICD-10-CM | POA: Insufficient documentation

## 2019-08-14 DIAGNOSIS — M4726 Other spondylosis with radiculopathy, lumbar region: Secondary | ICD-10-CM | POA: Diagnosis not present

## 2019-08-14 DIAGNOSIS — I480 Paroxysmal atrial fibrillation: Secondary | ICD-10-CM | POA: Insufficient documentation

## 2019-08-14 DIAGNOSIS — Z79899 Other long term (current) drug therapy: Secondary | ICD-10-CM | POA: Diagnosis not present

## 2019-08-14 DIAGNOSIS — Z7901 Long term (current) use of anticoagulants: Secondary | ICD-10-CM | POA: Insufficient documentation

## 2019-08-14 DIAGNOSIS — I252 Old myocardial infarction: Secondary | ICD-10-CM | POA: Diagnosis not present

## 2019-08-14 DIAGNOSIS — Z419 Encounter for procedure for purposes other than remedying health state, unspecified: Secondary | ICD-10-CM

## 2019-08-14 DIAGNOSIS — Z951 Presence of aortocoronary bypass graft: Secondary | ICD-10-CM | POA: Diagnosis not present

## 2019-08-14 DIAGNOSIS — Z85828 Personal history of other malignant neoplasm of skin: Secondary | ICD-10-CM | POA: Diagnosis not present

## 2019-08-14 DIAGNOSIS — Z8673 Personal history of transient ischemic attack (TIA), and cerebral infarction without residual deficits: Secondary | ICD-10-CM | POA: Diagnosis not present

## 2019-08-14 DIAGNOSIS — Z888 Allergy status to other drugs, medicaments and biological substances status: Secondary | ICD-10-CM | POA: Diagnosis not present

## 2019-08-14 DIAGNOSIS — Z952 Presence of prosthetic heart valve: Secondary | ICD-10-CM | POA: Insufficient documentation

## 2019-08-14 DIAGNOSIS — I2510 Atherosclerotic heart disease of native coronary artery without angina pectoris: Secondary | ICD-10-CM | POA: Diagnosis not present

## 2019-08-14 DIAGNOSIS — I428 Other cardiomyopathies: Secondary | ICD-10-CM | POA: Diagnosis not present

## 2019-08-14 HISTORY — PX: LUMBAR LAMINECTOMY/DECOMPRESSION MICRODISCECTOMY: SHX5026

## 2019-08-14 LAB — PROTIME-INR
INR: 1 (ref 0.8–1.2)
Prothrombin Time: 12.8 seconds (ref 11.4–15.2)

## 2019-08-14 SURGERY — LUMBAR LAMINECTOMY/DECOMPRESSION MICRODISCECTOMY 1 LEVEL
Anesthesia: General | Site: Back | Laterality: Left

## 2019-08-14 MED ORDER — ACETAMINOPHEN 650 MG RE SUPP: 650.0000 mg | RECTAL | Status: DC | PRN

## 2019-08-14 MED ORDER — METOPROLOL TARTRATE 25 MG PO TABS: 25.0000 mg | ORAL_TABLET | Freq: Two times a day (BID) | ORAL | Status: DC

## 2019-08-14 MED ORDER — MIRABEGRON ER 50 MG PO TB24: 50.0000 mg | ORAL_TABLET | Freq: Every day | ORAL | Status: DC

## 2019-08-14 MED ORDER — DEXAMETHASONE SODIUM PHOSPHATE 10 MG/ML IJ SOLN
10.0000 mg | Freq: Once | INTRAMUSCULAR | Status: AC
  Administered 2019-08-14: 11:00:00 10 mg via INTRAVENOUS

## 2019-08-14 MED ORDER — ONDANSETRON HCL 4 MG/2ML IJ SOLN
INTRAMUSCULAR | Status: DC | PRN
  Administered 2019-08-14: 4 mg via INTRAVENOUS

## 2019-08-14 MED ORDER — HYDROCODONE-ACETAMINOPHEN 5-325 MG PO TABS: 1.0000 | ORAL_TABLET | ORAL | Status: DC | PRN

## 2019-08-14 MED ORDER — PHENYLEPHRINE 40 MCG/ML (10ML) SYRINGE FOR IV PUSH (FOR BLOOD PRESSURE SUPPORT)
PREFILLED_SYRINGE | INTRAVENOUS | Status: DC | PRN
  Administered 2019-08-14: 120 ug via INTRAVENOUS

## 2019-08-14 MED ORDER — FUROSEMIDE 20 MG PO TABS
20.0000 mg | ORAL_TABLET | Freq: Every day | ORAL | Status: DC
  Filled 2019-08-14: qty 1

## 2019-08-14 MED ORDER — BUPIVACAINE HCL (PF) 0.25 % IJ SOLN
INTRAMUSCULAR | Status: DC | PRN
  Administered 2019-08-14: 20 mL

## 2019-08-14 MED ORDER — CHLORHEXIDINE GLUCONATE CLOTH 2 % EX PADS: 6.0000 | MEDICATED_PAD | Freq: Once | CUTANEOUS | Status: DC

## 2019-08-14 MED ORDER — SODIUM CHLORIDE 0.9% FLUSH: 3.0000 mL | Freq: Two times a day (BID) | INTRAVENOUS | Status: DC

## 2019-08-14 MED ORDER — HYDROCODONE-ACETAMINOPHEN 5-325 MG PO TABS: 1.0000 | ORAL_TABLET | ORAL | 0 refills | Status: DC | PRN

## 2019-08-14 MED ORDER — LIDOCAINE 2% (20 MG/ML) 5 ML SYRINGE
INTRAMUSCULAR | Status: DC | PRN
  Administered 2019-08-14: 100 mg via INTRAVENOUS

## 2019-08-14 MED ORDER — THROMBIN 5000 UNITS EX SOLR
CUTANEOUS | Status: DC | PRN
  Administered 2019-08-14 (×2): 5000 [IU] via TOPICAL

## 2019-08-14 MED ORDER — DOXYCYCLINE HYCLATE 100 MG PO TABS
100.0000 mg | ORAL_TABLET | Freq: Every day | ORAL | Status: DC
  Filled 2019-08-14: qty 1

## 2019-08-14 MED ORDER — THROMBIN 5000 UNITS EX SOLR
CUTANEOUS | Status: AC
  Filled 2019-08-14: qty 15000

## 2019-08-14 MED ORDER — PHENOL 1.4 % MT LIQD: 1.0000 | OROMUCOSAL | Status: DC | PRN

## 2019-08-14 MED ORDER — CEFAZOLIN SODIUM-DEXTROSE 2-4 GM/100ML-% IV SOLN
2.0000 g | INTRAVENOUS | Status: AC
  Administered 2019-08-14: 11:00:00 2 g via INTRAVENOUS

## 2019-08-14 MED ORDER — DIGOXIN 125 MCG PO TABS
0.1250 mg | ORAL_TABLET | Freq: Every day | ORAL | Status: DC
  Filled 2019-08-14: qty 1

## 2019-08-14 MED ORDER — EPHEDRINE SULFATE-NACL 50-0.9 MG/10ML-% IV SOSY
PREFILLED_SYRINGE | INTRAVENOUS | Status: DC | PRN
  Administered 2019-08-14: 15 mg via INTRAVENOUS

## 2019-08-14 MED ORDER — SUGAMMADEX SODIUM 200 MG/2ML IV SOLN
INTRAVENOUS | Status: DC | PRN
  Administered 2019-08-14: 200 mg via INTRAVENOUS

## 2019-08-14 MED ORDER — FENTANYL CITRATE (PF) 250 MCG/5ML IJ SOLN
INTRAMUSCULAR | Status: AC
  Filled 2019-08-14: qty 5

## 2019-08-14 MED ORDER — ONDANSETRON HCL 4 MG/2ML IJ SOLN: 4.0000 mg | Freq: Four times a day (QID) | INTRAMUSCULAR | Status: DC | PRN

## 2019-08-14 MED ORDER — SODIUM CHLORIDE 0.9 % IV SOLN
INTRAVENOUS | Status: DC | PRN
  Administered 2019-08-14: 500 mL

## 2019-08-14 MED ORDER — HYDROCODONE-ACETAMINOPHEN 10-325 MG PO TABS: 2.0000 | ORAL_TABLET | ORAL | Status: DC | PRN

## 2019-08-14 MED ORDER — ACETAMINOPHEN 10 MG/ML IV SOLN: 1000.0000 mg | Freq: Once | INTRAVENOUS | Status: DC | PRN

## 2019-08-14 MED ORDER — B COMPLEX-C PO TABS
1.0000 | ORAL_TABLET | Freq: Every day | ORAL | Status: DC
  Administered 2019-08-14: 1 via ORAL
  Filled 2019-08-14: qty 1

## 2019-08-14 MED ORDER — SODIUM CHLORIDE 0.9 % IV SOLN: 250.0000 mL | INTRAVENOUS | Status: DC

## 2019-08-14 MED ORDER — ONDANSETRON HCL 4 MG/2ML IJ SOLN
INTRAMUSCULAR | Status: AC
  Filled 2019-08-14: qty 2

## 2019-08-14 MED ORDER — CEFAZOLIN SODIUM-DEXTROSE 1-4 GM/50ML-% IV SOLN: 1.0000 g | Freq: Three times a day (TID) | INTRAVENOUS | Status: DC

## 2019-08-14 MED ORDER — CEFAZOLIN SODIUM-DEXTROSE 2-4 GM/100ML-% IV SOLN
INTRAVENOUS | Status: AC
  Filled 2019-08-14: qty 100

## 2019-08-14 MED ORDER — LIDOCAINE 2% (20 MG/ML) 5 ML SYRINGE
INTRAMUSCULAR | Status: AC
  Filled 2019-08-14: qty 5

## 2019-08-14 MED ORDER — ONDANSETRON HCL 4 MG PO TABS: 4.0000 mg | ORAL_TABLET | Freq: Four times a day (QID) | ORAL | Status: DC | PRN

## 2019-08-14 MED ORDER — MENTHOL 3 MG MT LOZG: 1.0000 | LOZENGE | OROMUCOSAL | Status: DC | PRN

## 2019-08-14 MED ORDER — EZETIMIBE 10 MG PO TABS
10.0000 mg | ORAL_TABLET | Freq: Every day | ORAL | Status: DC
  Filled 2019-08-14: qty 1

## 2019-08-14 MED ORDER — COLESEVELAM HCL 625 MG PO TABS: 1875.0000 mg | ORAL_TABLET | Freq: Two times a day (BID) | ORAL | Status: DC

## 2019-08-14 MED ORDER — KETOROLAC TROMETHAMINE 15 MG/ML IJ SOLN
7.5000 mg | Freq: Four times a day (QID) | INTRAMUSCULAR | Status: DC
  Administered 2019-08-14: 7.5 mg via INTRAVENOUS
  Filled 2019-08-14: qty 1

## 2019-08-14 MED ORDER — ETOMIDATE 2 MG/ML IV SOLN
INTRAVENOUS | Status: AC
  Filled 2019-08-14: qty 10

## 2019-08-14 MED ORDER — CYCLOBENZAPRINE HCL 10 MG PO TABS: 10.0000 mg | ORAL_TABLET | Freq: Three times a day (TID) | ORAL | 0 refills | Status: DC | PRN

## 2019-08-14 MED ORDER — ALBUMIN HUMAN 5 % IV SOLN
INTRAVENOUS | Status: DC | PRN
  Administered 2019-08-14: 11:00:00 via INTRAVENOUS

## 2019-08-14 MED ORDER — BUPIVACAINE HCL (PF) 0.25 % IJ SOLN
INTRAMUSCULAR | Status: AC
  Filled 2019-08-14: qty 30

## 2019-08-14 MED ORDER — FENTANYL CITRATE (PF) 100 MCG/2ML IJ SOLN
INTRAMUSCULAR | Status: DC | PRN
  Administered 2019-08-14 (×2): 50 ug via INTRAVENOUS

## 2019-08-14 MED ORDER — ONDANSETRON HCL 4 MG/2ML IJ SOLN: 4.0000 mg | Freq: Once | INTRAMUSCULAR | Status: DC | PRN

## 2019-08-14 MED ORDER — PHENYLEPHRINE HCL-NACL 10-0.9 MG/250ML-% IV SOLN
INTRAVENOUS | Status: DC | PRN
  Administered 2019-08-14: 40 ug/min via INTRAVENOUS

## 2019-08-14 MED ORDER — LACTATED RINGERS IV SOLN
INTRAVENOUS | Status: DC
  Administered 2019-08-14: 09:00:00 via INTRAVENOUS

## 2019-08-14 MED ORDER — VITAMIN E 45 MG (100 UNIT) PO CAPS
1000.0000 [IU] | ORAL_CAPSULE | Freq: Every day | ORAL | Status: DC
  Administered 2019-08-14: 1000 [IU] via ORAL
  Filled 2019-08-14: qty 2

## 2019-08-14 MED ORDER — ROCURONIUM BROMIDE 10 MG/ML (PF) SYRINGE
PREFILLED_SYRINGE | INTRAVENOUS | Status: AC
  Filled 2019-08-14: qty 10

## 2019-08-14 MED ORDER — SODIUM CHLORIDE 0.9% FLUSH: 3.0000 mL | INTRAVENOUS | Status: DC | PRN

## 2019-08-14 MED ORDER — ACETAMINOPHEN 10 MG/ML IV SOLN
1000.0000 mg | Freq: Once | INTRAVENOUS | Status: AC
  Administered 2019-08-14: 1000 mg via INTRAVENOUS

## 2019-08-14 MED ORDER — DEXAMETHASONE SODIUM PHOSPHATE 10 MG/ML IJ SOLN
INTRAMUSCULAR | Status: AC
  Filled 2019-08-14: qty 1

## 2019-08-14 MED ORDER — PROPOFOL 10 MG/ML IV BOLUS
INTRAVENOUS | Status: AC
  Filled 2019-08-14: qty 20

## 2019-08-14 MED ORDER — 0.9 % SODIUM CHLORIDE (POUR BTL) OPTIME
TOPICAL | Status: DC | PRN
  Administered 2019-08-14: 1000 mL

## 2019-08-14 MED ORDER — VITAMIN D 25 MCG (1000 UNIT) PO TABS
2000.0000 [IU] | ORAL_TABLET | Freq: Every day | ORAL | Status: DC
  Administered 2019-08-14: 2000 [IU] via ORAL
  Filled 2019-08-14: qty 2

## 2019-08-14 MED ORDER — ACETAMINOPHEN 10 MG/ML IV SOLN
INTRAVENOUS | Status: AC
  Filled 2019-08-14: qty 100

## 2019-08-14 MED ORDER — ETOMIDATE 2 MG/ML IV SOLN
INTRAVENOUS | Status: DC | PRN
  Administered 2019-08-14: 8 mg via INTRAVENOUS

## 2019-08-14 MED ORDER — CYCLOBENZAPRINE HCL 10 MG PO TABS: 10.0000 mg | ORAL_TABLET | Freq: Three times a day (TID) | ORAL | Status: DC | PRN

## 2019-08-14 MED ORDER — ACETAMINOPHEN 325 MG PO TABS: 650.0000 mg | ORAL_TABLET | ORAL | Status: DC | PRN

## 2019-08-14 MED ORDER — HEMOSTATIC AGENTS (NO CHARGE) OPTIME
TOPICAL | Status: DC | PRN
  Administered 2019-08-14: 1 via TOPICAL

## 2019-08-14 MED ORDER — HYDROMORPHONE HCL 1 MG/ML IJ SOLN: 1.0000 mg | INTRAMUSCULAR | Status: DC | PRN

## 2019-08-14 MED ORDER — ROCURONIUM BROMIDE 10 MG/ML (PF) SYRINGE
PREFILLED_SYRINGE | INTRAVENOUS | Status: DC | PRN
  Administered 2019-08-14: 60 mg via INTRAVENOUS

## 2019-08-14 MED ORDER — FENTANYL CITRATE (PF) 100 MCG/2ML IJ SOLN: 25.0000 ug | INTRAMUSCULAR | Status: DC | PRN

## 2019-08-14 SURGICAL SUPPLY — 53 items
BAG DECANTER FOR FLEXI CONT (MISCELLANEOUS) ×3 IMPLANT
BAND RUBBER #18 3X1/16 STRL (MISCELLANEOUS) ×6 IMPLANT
BENZOIN TINCTURE PRP APPL 2/3 (GAUZE/BANDAGES/DRESSINGS) ×3 IMPLANT
BLADE CLIPPER SURG (BLADE) IMPLANT
BUR CUTTER 7.0 ROUND (BURR) ×3 IMPLANT
CANISTER SUCT 3000ML PPV (MISCELLANEOUS) ×3 IMPLANT
CARTRIDGE OIL MAESTRO DRILL (MISCELLANEOUS) ×1 IMPLANT
CLOSURE WOUND 1/2 X4 (GAUZE/BANDAGES/DRESSINGS) ×1
COVER WAND RF STERILE (DRAPES) ×3 IMPLANT
DECANTER SPIKE VIAL GLASS SM (MISCELLANEOUS) ×3 IMPLANT
DERMABOND ADVANCED (GAUZE/BANDAGES/DRESSINGS) ×2
DERMABOND ADVANCED .7 DNX12 (GAUZE/BANDAGES/DRESSINGS) ×1 IMPLANT
DIFFUSER DRILL AIR PNEUMATIC (MISCELLANEOUS) ×3 IMPLANT
DRAPE HALF SHEET 40X57 (DRAPES) IMPLANT
DRAPE LAPAROTOMY 100X72X124 (DRAPES) ×3 IMPLANT
DRAPE MICROSCOPE LEICA (MISCELLANEOUS) ×3 IMPLANT
DRAPE SURG 17X23 STRL (DRAPES) ×6 IMPLANT
DURAPREP 26ML APPLICATOR (WOUND CARE) ×3 IMPLANT
ELECT REM PT RETURN 9FT ADLT (ELECTROSURGICAL) ×3
ELECTRODE REM PT RTRN 9FT ADLT (ELECTROSURGICAL) ×1 IMPLANT
GAUZE 4X4 16PLY RFD (DISPOSABLE) IMPLANT
GAUZE SPONGE 4X4 12PLY STRL (GAUZE/BANDAGES/DRESSINGS) ×3 IMPLANT
GLOVE BIO SURGEON STRL SZ 6.5 (GLOVE) ×2 IMPLANT
GLOVE BIO SURGEONS STRL SZ 6.5 (GLOVE) ×1
GLOVE BIOGEL PI IND STRL 6.5 (GLOVE) ×1 IMPLANT
GLOVE BIOGEL PI INDICATOR 6.5 (GLOVE) ×6
GLOVE ECLIPSE 6.5 STRL STRAW (GLOVE) ×2 IMPLANT
GLOVE ECLIPSE 9.0 STRL (GLOVE) ×3 IMPLANT
GLOVE EXAM NITRILE XL STR (GLOVE) IMPLANT
GLOVE SURG SS PI 6.5 STRL IVOR (GLOVE) ×2 IMPLANT
GLOVE SURG SS PI 8.5 STRL IVOR (GLOVE) ×4
GLOVE SURG SS PI 8.5 STRL STRW (GLOVE) IMPLANT
GOWN STRL REUS W/ TWL LRG LVL3 (GOWN DISPOSABLE) IMPLANT
GOWN STRL REUS W/ TWL XL LVL3 (GOWN DISPOSABLE) ×1 IMPLANT
GOWN STRL REUS W/TWL 2XL LVL3 (GOWN DISPOSABLE) IMPLANT
GOWN STRL REUS W/TWL LRG LVL3 (GOWN DISPOSABLE) ×6
GOWN STRL REUS W/TWL XL LVL3 (GOWN DISPOSABLE) ×2
KIT BASIN OR (CUSTOM PROCEDURE TRAY) ×3 IMPLANT
KIT TURNOVER KIT B (KITS) ×3 IMPLANT
NDL SPNL 22GX3.5 QUINCKE BK (NEEDLE) IMPLANT
NEEDLE HYPO 22GX1.5 SAFETY (NEEDLE) ×3 IMPLANT
NEEDLE SPNL 22GX3.5 QUINCKE BK (NEEDLE) IMPLANT
NS IRRIG 1000ML POUR BTL (IV SOLUTION) ×3 IMPLANT
OIL CARTRIDGE MAESTRO DRILL (MISCELLANEOUS) ×3
PACK LAMINECTOMY NEURO (CUSTOM PROCEDURE TRAY) ×3 IMPLANT
PAD ARMBOARD 7.5X6 YLW CONV (MISCELLANEOUS) ×3 IMPLANT
SPONGE SURGIFOAM ABS GEL SZ50 (HEMOSTASIS) ×3 IMPLANT
STRIP CLOSURE SKIN 1/2X4 (GAUZE/BANDAGES/DRESSINGS) ×2 IMPLANT
SUT VIC AB 2-0 CT1 18 (SUTURE) ×3 IMPLANT
SUT VIC AB 3-0 SH 8-18 (SUTURE) ×3 IMPLANT
TOWEL GREEN STERILE (TOWEL DISPOSABLE) ×3 IMPLANT
TOWEL GREEN STERILE FF (TOWEL DISPOSABLE) ×3 IMPLANT
WATER STERILE IRR 1000ML POUR (IV SOLUTION) ×3 IMPLANT

## 2019-08-14 NOTE — Transfer of Care (Signed)
Immediate Anesthesia Transfer of Care Note  Patient: Juan [name]  Procedure(s) Performed: Laminectomy for facet/synovial cyst - left - Lumbar three-Lumbar four (Left Back)  Patient Location: PACU  Anesthesia Type:General  Level of Consciousness: awake, alert  and oriented  Airway & Oxygen Therapy: Patient Spontanous Breathing and Patient connected to nasal cannula oxygen  Post-op Assessment: Report given to RN, Post -op Vital signs reviewed and stable and Patient moving all extremities X 4  Post vital signs: Reviewed and stable  Last Vitals:  Vitals Value Taken Time  BP 139/93 08/14/19 1206  Temp 36.1 C 08/14/19 1206  Pulse 50 08/14/19 1219  Resp 13 08/14/19 1219  SpO2 100 % 08/14/19 1219  Vitals shown include unvalidated device data.  Last Pain:  Vitals:   08/14/19 1206  TempSrc:   PainSc: 0-No pain      Patients Stated Pain Goal: 2 ([id number])  Complications: No apparent anesthesia complications

## 2019-08-14 NOTE — Anesthesia Procedure Notes (Signed)
Procedure Name: Intubation Date/Time: 08/14/2019 10:33 AM Performed by: Kyung Rudd, CRNA Pre-anesthesia Checklist: Patient identified, Emergency Drugs available, Suction available, Patient being monitored and Timeout performed Patient Re-evaluated:Patient Re-evaluated prior to induction Oxygen Delivery Method: Circle system utilized Preoxygenation: Pre-oxygenation with 100% oxygen Induction Type: IV induction Ventilation: Mask ventilation without difficulty Laryngoscope Size: Mac and 4 Grade View: Grade III Tube type: Oral Tube size: 7.5 mm Number of attempts: 1 Airway Equipment and Method: Stylet Placement Confirmation: positive ETCO2 and breath sounds checked- equal and bilateral Secured at: 21 cm Tube secured with: Tape Dental Injury: Teeth and Oropharynx as per pre-operative assessment

## 2019-08-14 NOTE — Progress Notes (Signed)
Pt doing well. Pt and wife given D/C instructions with verbal understanding. Rx's were sent to pharmacy by MD. Pt's incision is clean and dry with no sign of infection. Pt's IV was removed prior to D/C. Pt D/C'd home via wheelchair per MD order. Pt is stable @ D/C and has no other needs at this time. Darrold Bezek, RN  

## 2019-08-14 NOTE — Discharge Summary (Signed)
Physician Discharge Summary  [name] MRN: [id number] DOB/AGE: [birth year] 79 y.o.  Admit date: 08/14/2019 Discharge date: 08/14/2019  Admission Diagnoses:  Discharge Diagnoses:  Active Problems:   Synovial cyst of lumbar facet joint   Discharged Condition: good  Hospital Course: Patient admitted to the hospital where he underwent uncomplicated lumbar decompressive surgery with resection of adherent synovial cyst.  Postop Truman Hayward doing very well.  Preoperative back and lower extremity pain numbness and weakness much improved.  Ambulating and voiding without difficulty.  Ready for discharge home.  Consults:   Significant Diagnostic Studies:   Treatments:   Discharge Exam: Blood pressure (!) 151/89, pulse 95, temperature 98.1 F (36.7 C), temperature source Oral, resp. rate 18, height 5' 9.5" (1.765 m), weight 67.2 kg, SpO2 99 %. Awake and alert.  Oriented and appropriate.  Motor and sensory function intact.  Wound clean and dry.  Chest and abdomen benign.  Disposition: Discharge disposition: 01-Home or Self Care        Allergies as of 08/14/2019      Reactions   Prednisone Other (See Comments)   Developed afib after use.      Medication List    TAKE these medications   b complex-C-E-zinc tablet Take 1 tablet by mouth daily.   cholecalciferol 1000 units tablet Commonly known as: VITAMIN D Take 2,000 Units by mouth daily.   cyclobenzaprine 10 MG tablet Commonly known as: FLEXERIL Take 1 tablet (10 mg total) by mouth 3 (three) times daily as needed for muscle spasms.   digoxin 0.125 MG tablet Commonly known as: LANOXIN Take 0.125 mg by mouth daily. lunch   doxycycline 100 MG capsule Commonly known as: VIBRAMYCIN Take 100 mg by mouth at bedtime.   Eliquis 5 MG Tabs tablet Generic drug: apixaban Take 5 mg by mouth 2 (two) times daily.   ezetimibe 10 MG tablet Commonly known as: ZETIA Take 10 mg by mouth at bedtime.   furosemide 20  MG tablet Commonly known as: LASIX Take 20 mg by mouth daily. lunch   HYDROcodone-acetaminophen 5-325 MG tablet Commonly known as: NORCO/VICODIN Take 1 tablet by mouth every 4 (four) hours as needed for moderate pain ((score 4 to 6)).   metoprolol tartrate 50 MG tablet Commonly known as: LOPRESSOR Take 25 mg by mouth 2 (two) times daily.   Myrbetriq 50 MG Tb24 tablet Generic drug: mirabegron ER Take 50 mg by mouth daily.   selenium sulfide 2.5 % shampoo Commonly known as: SELSUN Apply 1 application topically daily.   vitamin E 1000 UNIT capsule Take 1,000 Units by mouth daily. Lunch   Welchol 625 MG tablet Generic drug: colesevelam Take 1,875 mg by mouth 2 (two) times daily.        Signed: Cooper Render Chania Kochanski 08/14/2019, 3:51 PM

## 2019-08-14 NOTE — Op Note (Signed)
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  Asst. Surgeon: Daun Peacock NP  Anesthesia: General  Indication: 78 year old male status post prior L4-5 decompression and fusion by an outside physician presents with severe left lower extremity pain paresthesias and weakness this is a left-sided L4 radiculopathy which is failed conservative manage.  Work-up demonstrates evidence of significant spondylosis with an associated synovial cyst on the left at L3-4 with marked compression of the left L4 nerve root.  Patient presents now for decompressive surgery and resection of cyst in hopes of improving his symptoms.  Operative note: After induction anesthesia, the patient is positioned prone onto a Wilson frame and appropriate padded.  At this point it became clear the patient was very cyanotic from his neck cephalad and both upper extremities.  Great work went into repositioning the patient.  Patient had good pulses in his carotid arteries and both radial arteries.  He had normal pulse ox  waveforms.  His cyanosis did improve with positioning which made me feel this is most likely a phenomenon of venous return.  At this point anesthesia and I decided proceed with the surgery.  Patient's lumbar region prepped and draped sterilely.  Incision made overlying L3-4.  Dissection performed on the left.  Retractor placed.  X-ray taken in the L2-3 level was confirmed to be exposed.  Dissection redirected 1 level caudally.  Retractor placed.  Laminotomy then performed using high-speed drill and Kerrison rongeurs to remove the inferior aspect lamina of L3 medial aspect the L3-4 facet joint and the superior rim of the L4 lamina.   Ligament flavum elevated and resected.  An adherent synovial cyst was dissected free using the microscope for microdissection of the spinal canal.  The underlying L4 nerve root was fully decompressed throughout the lateral recess and into its foramen.  At this point a very thorough decompression of been achieved.  There was no evidence of injury to the thecal sac or nerve roots.  Wound is then irrigated with MI solution.  Gelfoam was placed topically for hemostasis.  Wound is then closed in layers of Vicryl sutures.  Steri-Strips sterile dressing were applied.  No apparent complications.  Patient tolerated the procedure well and he returned to the recovery room postop.

## 2019-08-14 NOTE — H&P (Signed)
Juan [name] is an 78 y.o. male.   Chief Complaint: Left leg pain HPI: 78 year old male status post prior L4-5 decompression and fusion by an outside physician presents with worsening left lower extremity radicular pain extending into his left anterior thigh and anterior medial leg.  Symptoms aggravated by standing or walking.  Patient with some progressive weakness and sensory loss.  Patient is failed conservative management.  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  Patient presents now for decompressive surgery in hopes of improving his symptoms.  Past Medical History:  Diagnosis Date  . Arthritis    back- low  . Atrial fibrillation ([place])   . Cancer (HCC)    skin ( basal) cell , face, head, back, inside L ear  . Coronary artery disease   . Dysrhythmia   . MI (myocardial infarction) ([place])   . Sleep apnea    +Cpap in use nightly , last study- 10 yrs. ago  . 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 APPENDECTOMY    . BACK SURGERY  09/2011   lumbar fusion   . CARDIAC CATHETERIZATION    . CARDIAC SURGERY    . CARDIAC VALVE REPLACEMENT    . CORONARY ARTERY BYPASS GRAFT    . EYE SURGERY Bilateral    catarascts removed-   . HERNIA REPAIR Bilateral 1946, 1990's   inguinal x3 procedures   . TONSILLECTOMY      History reviewed. No pertinent family history. Social History:  reports that he has never smoked. He has never used smokeless tobacco. He reports that he does not drink alcohol or use drugs.  Allergies:  Allergies  Allergen Reactions  . Prednisone Other (See Comments)    Developed afib after use.    Medications Prior to Admission  Medication Sig Dispense Refill  . apixaban (ELIQUIS) 5 MG TABS tablet Take 5 mg by mouth 2 (two) times daily.    . B Complex-C-E-Zn (B COMPLEX-C-E-ZINC) tablet Take 1  tablet by mouth daily.    . cholecalciferol (VITAMIN D) 1000 units tablet Take 2,000 Units by mouth daily.    . colesevelam (WELCHOL) 625 MG tablet Take 1,875 mg by mouth 2 (two) times daily.     . digoxin (LANOXIN) 0.125 MG tablet Take 0.125 mg by mouth daily. lunch    . doxycycline (VIBRAMYCIN) 100 MG capsule Take 100 mg by mouth at bedtime.   2  . ezetimibe (ZETIA) 10 MG tablet Take 10 mg by mouth at bedtime.     . furosemide (LASIX) 20 MG tablet Take 20 mg by mouth daily. lunch    . metoprolol tartrate (LOPRESSOR) 50 MG tablet Take 25 mg by mouth 2 (two) times daily.    . mirabegron ER (MYRBETRIQ) 50 MG TB24 tablet Take 50 mg by mouth daily.    Marland Kitchen selenium sulfide (SELSUN) 2.5 % shampoo Apply 1 application topically daily.    . vitamin E 1000 UNIT capsule Take 1,000 Units by mouth daily. Lunch      Results for orders placed or performed during the hospital encounter of 08/14/19 (from the past 48 hour(s))  Protime-INR     Status: None   Collection Time: 08/14/19  8:51 AM  Result Value Ref Range   Prothrombin Time 12.8 11.4 - 15.2 seconds   INR 1.0 0.8 - 1.2  Comment: (NOTE) INR goal varies based on device and disease states. Performed at [facility], [place] [address] [place], [place] [address]    No results found.  Pertinent items noted in HPI and remainder of comprehensive ROS otherwise negative.  Blood pressure 140/83, pulse 87, temperature 98 F (36.7 C), temperature source Oral, resp. rate 20, height 5' 9.5" (1.765 m), weight 67.2 kg, SpO2 100 %.  Patient is awake and alert.  He is oriented and appropriate.  Speech is fluent.  Judgment insight are intact.  Cranial nerve function normal bilateral.  Motor examination extremities reveals weakness of his left-sided quadriceps muscle group grading out at 4-/5.  Weakness of his anterior tibialis grading at 4/5.  Remainder of his motor strength intact.  Sensory examination shows decreased sensation pinprick light touch in his  left L4 dermatome.  Deep tender versus normal active scepter his left patellar reflex is diminished and his Achilles reflexes are absent bilaterally.  Gait is antalgic.  Posture is mildly flexed.  Examination head ears eyes nose throat is unremarked.  Chest and abdomen are benign.  Extremities are free from injury or deformity. 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  Plan left L3-4 decompressive laminotomy and resection of synovial cyst.  Risks and benefits of been explained.  Patient wishes to proceed.  Mallie Mussel A Kyleeann Cremeans 08/14/2019, 9:50 AM

## 2019-08-14 NOTE — Anesthesia Postprocedure Evaluation (Signed)
Anesthesia Post Note  Patient: Juan [name]  Procedure(s) Performed: Laminectomy for facet/synovial cyst - left - Lumbar three-Lumbar four (Left Back)     Patient location during evaluation: PACU Anesthesia Type: General Level of consciousness: awake and alert Pain management: pain level controlled Vital Signs Assessment: post-procedure vital signs reviewed and stable Respiratory status: spontaneous breathing, nonlabored ventilation, respiratory function stable and patient connected to nasal cannula oxygen Cardiovascular status: blood pressure returned to baseline and stable Postop Assessment: no apparent nausea or vomiting Anesthetic complications: no    Last Vitals:  Vitals:   08/14/19 1206 08/14/19 1230  BP: (!) 139/93 118/69  Pulse: (!) 105 76  Resp: 14 18  Temp: (!) 36.1 C   SpO2: 100% 100%    Last Pain:  Vitals:   08/14/19 1230  TempSrc:   PainSc: 0-No pain                 Barnet Glasgow

## 2019-08-14 NOTE — Brief Op Note (Signed)
08/14/2019  11:47 AM  PATIENT:  Juan [name]  77 y.o. male  PRE-OPERATIVE DIAGNOSIS:  Synovial cyst  POST-OPERATIVE DIAGNOSIS:  Synovial cyst  PROCEDURE:  Procedure(s): Laminectomy for facet/synovial cyst - left - Lumbar three-Lumbar four (Left)  SURGEON:  Surgeon(s) and Role:    Earnie Larsson, MD - Primary  PHYSICIAN ASSISTANT:   ASSISTANTS: Daun Peacock, NP   ANESTHESIA:   general  EBL: Less than 75 cc  BLOOD ADMINISTERED:none  DRAINS: none   LOCAL MEDICATIONS USED:  MARCAINE     SPECIMEN:  No Specimen  DISPOSITION OF SPECIMEN:  N/A  COUNTS:  YES  TOURNIQUET:  * No tourniquets in log *  DICTATION: .Dragon Dictation  PLAN OF CARE: Admit for overnight observation  PATIENT DISPOSITION:  PACU - hemodynamically stable.   Delay start of Pharmacological VTE agent (>24hrs) due to surgical blood loss or risk of bleeding: yes

## 2019-08-14 NOTE — Discharge Instructions (Signed)
Wound Care Keep incision covered and dry for two days.  If you shower, cover incision with plastic wrap.  Do not put any creams, lotions, or ointments on incision. Leave steri-strips on back.  They will fall off by themselves. Activity Walk each and every day, increasing distance each day. No lifting greater than 5 lbs.  Avoid excessive neck motion. No driving for 2 weeks; may ride as a passenger locally. If provided with back brace, wear when out of bed.  It is not necessary to wear brace in bed. Diet Resume your normal diet.  Return to Work Will be discussed at you follow up appointment. Call Your Doctor If Any of These Occur Redness, drainage, or swelling at the wound.  Temperature greater than 101 degrees. Severe pain not relieved by pain medication. Incision starts to come apart. Follow Up Appt Call today for appointment in 1-2 weeks [id number]) or for problems.  If you have any hardware placed in your spine, you will need an x-ray before your appointment.  RESTART ELIQUIS on Monday

## 2019-08-15 ENCOUNTER — Encounter (HOSPITAL_COMMUNITY): Payer: Self-pay | Admitting: Neurosurgery

## 2019-09-01 DIAGNOSIS — Z9889 Other specified postprocedural states: Secondary | ICD-10-CM | POA: Diagnosis not present

## 2019-09-01 DIAGNOSIS — Z79899 Other long term (current) drug therapy: Secondary | ICD-10-CM | POA: Diagnosis not present

## 2019-09-01 DIAGNOSIS — I4891 Unspecified atrial fibrillation: Secondary | ICD-10-CM | POA: Diagnosis not present

## 2019-09-07 DIAGNOSIS — G4733 Obstructive sleep apnea (adult) (pediatric): Secondary | ICD-10-CM | POA: Diagnosis not present

## 2019-09-28 DIAGNOSIS — E782 Mixed hyperlipidemia: Secondary | ICD-10-CM | POA: Diagnosis not present

## 2019-09-28 DIAGNOSIS — R7309 Other abnormal glucose: Secondary | ICD-10-CM | POA: Diagnosis not present

## 2019-09-28 DIAGNOSIS — Z125 Encounter for screening for malignant neoplasm of prostate: Secondary | ICD-10-CM | POA: Diagnosis not present

## 2019-09-28 DIAGNOSIS — R29898 Other symptoms and signs involving the musculoskeletal system: Secondary | ICD-10-CM | POA: Diagnosis not present

## 2019-09-28 DIAGNOSIS — G4733 Obstructive sleep apnea (adult) (pediatric): Secondary | ICD-10-CM | POA: Diagnosis not present

## 2019-09-28 DIAGNOSIS — Z9989 Dependence on other enabling machines and devices: Secondary | ICD-10-CM | POA: Diagnosis not present

## 2019-09-28 DIAGNOSIS — Z79899 Other long term (current) drug therapy: Secondary | ICD-10-CM | POA: Diagnosis not present

## 2019-09-28 DIAGNOSIS — Z0000 Encounter for general adult medical examination without abnormal findings: Secondary | ICD-10-CM | POA: Diagnosis not present

## 2019-09-28 DIAGNOSIS — I2510 Atherosclerotic heart disease of native coronary artery without angina pectoris: Secondary | ICD-10-CM | POA: Diagnosis not present

## 2019-09-28 DIAGNOSIS — I4891 Unspecified atrial fibrillation: Secondary | ICD-10-CM | POA: Diagnosis not present

## 2019-09-28 DIAGNOSIS — T466X5A Adverse effect of antihyperlipidemic and antiarteriosclerotic drugs, initial encounter: Secondary | ICD-10-CM | POA: Diagnosis not present

## 2019-09-28 DIAGNOSIS — G720 Drug-induced myopathy: Secondary | ICD-10-CM | POA: Diagnosis not present

## 2019-09-28 DIAGNOSIS — E559 Vitamin D deficiency, unspecified: Secondary | ICD-10-CM | POA: Diagnosis not present

## 2019-10-08 DIAGNOSIS — G4733 Obstructive sleep apnea (adult) (pediatric): Secondary | ICD-10-CM | POA: Diagnosis not present

## 2019-10-28 DIAGNOSIS — G3184 Mild cognitive impairment, so stated: Secondary | ICD-10-CM | POA: Diagnosis not present

## 2019-10-28 DIAGNOSIS — Z8673 Personal history of transient ischemic attack (TIA), and cerebral infarction without residual deficits: Secondary | ICD-10-CM | POA: Diagnosis not present

## 2019-10-28 DIAGNOSIS — M21372 Foot drop, left foot: Secondary | ICD-10-CM | POA: Diagnosis not present

## 2019-11-07 ENCOUNTER — Ambulatory Visit: Payer: PPO | Attending: Internal Medicine

## 2019-11-08 DIAGNOSIS — G4733 Obstructive sleep apnea (adult) (pediatric): Secondary | ICD-10-CM | POA: Diagnosis not present

## 2019-11-11 ENCOUNTER — Ambulatory Visit: Payer: PPO | Attending: Neurology | Admitting: Physical Therapy

## 2019-11-11 ENCOUNTER — Other Ambulatory Visit: Payer: Self-pay

## 2019-11-11 ENCOUNTER — Encounter: Payer: Self-pay | Admitting: Physical Therapy

## 2019-11-11 DIAGNOSIS — R262 Difficulty in walking, not elsewhere classified: Secondary | ICD-10-CM | POA: Insufficient documentation

## 2019-11-11 DIAGNOSIS — M21372 Foot drop, left foot: Secondary | ICD-10-CM | POA: Diagnosis not present

## 2019-11-11 DIAGNOSIS — M25675 Stiffness of left foot, not elsewhere classified: Secondary | ICD-10-CM | POA: Diagnosis not present

## 2019-11-11 NOTE — Therapy (Signed)
[facility] [address], [place], [address] Phone: [phone]   Fax:  [phone]  Physical Therapy Treatment  Patient Details  Name: Juan [name] MRN: [id number] Date of Birth: [birth year] No data recorded  Encounter Date: 11/11/2019  PT End of Session - 11/11/19 0924    Visit Number  1    Number of Visits  17    Date for PT Re-Evaluation  01/06/20    PT Start Time  0915    PT Stop Time  1015    PT Time Calculation (min)  60 min    Equipment Utilized During Treatment  Gait belt    Activity Tolerance  Patient tolerated treatment well    Behavior During Therapy  [facility] for tasks assessed/performed       Past Medical History:  Diagnosis Date  . Arthritis    back- low  . Atrial fibrillation ([place])   . Cancer (HCC)    skin ( basal) cell , face, head, back, inside L ear  . Coronary artery disease   . Dysrhythmia   . MI (myocardial infarction) ([place])   . Sleep apnea    +Cpap in use nightly , last study- 10 yrs. ago  . 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 APPENDECTOMY    . BACK SURGERY  09/2011   lumbar fusion   . CARDIAC CATHETERIZATION    . CARDIAC SURGERY    . CARDIAC VALVE REPLACEMENT    . CORONARY ARTERY BYPASS GRAFT    . EYE SURGERY Bilateral    catarascts removed-   . HERNIA REPAIR Bilateral 1946, 1990's   inguinal x3 procedures   . LUMBAR LAMINECTOMY/DECOMPRESSION MICRODISCECTOMY Left 08/14/2019   Procedure: Laminectomy for facet/synovial cyst - left - Lumbar three-Lumbar four;  Surgeon: Earnie Larsson, MD;  Location: [facility];  Service: Neurosurgery;  Laterality: Left;  . TONSILLECTOMY      There were no vitals filed for this visit.  Subjective Assessment - 11/11/19 0914    Pertinent History  Pt is a 79 year old male S/P L3-4 laminectomy 08/14/19. Prior to surgery had severe left LE pain and paresthsia that failed conservative  intervention. Aim of surgery was to decrease symptoms through resection of synovial cyst on left of L3-4 with decompression. History of L4-5 decompression and fusion for similar symptoms in 2013. Patient reports since this surgery his pain is allieviated which he is happy with. Patient reports his numbess/tingling is gone as well, but that his foot is dropping when he walks and notices he cannot pull his toes up in sitting. He does report that he feels unsteady on his feet and has had some instances where he catches his balance and is falling forward. He thinks he has lost some LE strength as well. He walks in the mall 4-5days/week with his wife about 65mins, enjoys woodworking, and plays Production designer, theatre/television/film. Reports he has some difficulty with raising from a chair, and feels unsteady on his feet but is able to complete all ADLs ind.  Pt denies N/V, B&B changes, unexplained weight fluctuation, saddle paresthesia, fever, night sweats, or unrelenting night pain at this time.    How long can you sit comfortably?  unlimited    How long can you stand comfortably?  unlimited    How long can you walk comfortably?  38mins    Diagnostic tests  None since surgery  Patient Stated Goals  Walk normally with toe raise    Currently in Pain?  No/denies          OBJECTIVE  Mental Status Patient is oriented to person, place and time.  Recent memory is intact.  Remote memory is intact.  Attention span and concentration are intact.  Expressive speech is intact.  Patient's fund of knowledge is within normal limits for educational level.  SENSATION: Grossly intact to light touch bilateral LEs as determined by testing dermatomes L2-S2 Proprioception and hot/cold testing deferred on this date   MUSCULOSKELETAL: Tremor: None Bulk: Normal Tone: Normal No visible step-off along spinal column  Posture Lumbar lordosis: increased Thoracic kyphosis: increased with rounded shoulders Iliac crest height: equal  bilaterally Lumbar lateral shift: negative Lower crossed syndrome (tight hip flexors and erector spinae; weak gluts and abs): Positive   Gait Slight for forward trunk lean, L toe strike with compensating eversion and slight circumduction   Palpation No TTP at left gastroc, lumbar paraspinals, or bilat glute musculature   Strength (out of 5) R/L 4+/4- Hip flexion 4+/4- Hip ER 4+/4 Hip IR 4+/4 Hip abduction 5/5 Hip adduction 4/3+ Hip extension 5/5 Knee extension 5/5 Knee flexion 4+/3 Ankle dorsiflexion 5/5 Ankle plantarflexion 5 Trunk flexion 5/5 Trunk rotation  *Indicates pain   AROM (degrees) R/L (all movements include overpressure unless otherwise stated) Lumbar forward flexion (65): 25% limited d/t hamstring tension Lumbar extension (30): Not observed Lumbar lateral flexion (25): Normal bilat Thoracic and Lumbar rotation (30 degrees): Normal bilat Hip IR (0-45): limited 75% bilat Hip ER (0-45): WNL bilat Hip Flexion (0-125): WNL bilat Hip Abduction (0-40): WNL bilat Hip extension (0-15): R: 15d L: 8d with attempt of rotation compensation Ankle DF 10d; -8d *Indicates pain   PROM (degrees) PROM = AROM for hip and spine motions Ankle DF 12d; 0d  Repeated Movements No centralization or peripheralization of symptoms with repeated lumbar extension or flexion.    Muscle Length Hamstrings:+ bilat L>R Ely: R Negative L positive Thomas: R Negative L positive Ober: Negative bilat   Passive Accessory Intervertebral Motion (PAIVM) Pt denies reproduction of back pain with CPA L1-L5 and UPA bilaterally L1-L5. Generally hypomobile throughout  Passive Physiological Intervertebral Motion (PPIVM) Normal flexion and extension with PPIVM testing   SPECIAL TESTS Lumbar Radiculopathy and Discogenic: Centralization and Peripheralization (SN 92, -LR 0.12):Not completed, no pain and laminectomy Slump (SN 83, -LR 0.32): Negative bilat SLR (SN 92, -LR 0.29): Negative Crossed  SLR (SP 90): Negative  Hip: FABER (SN 81): R: Negative L: Positive FADIR (SN 94): Positive bilat Hip scour (SN 50): Negative bilat   Functional Tasks Squatting: minimal L heel lift with good hip mobility and no LOB  Sit to stand: Able to stand without UEs with heavy forward trunk lean  5xSTS 17sec 10MWT 1.51m/s BERG 51/56 Functional Gait 20/30 DGI 21/24  Ther-Ex Patient educated on the following HEP, and able to complete a set of all of the following with minimal correction needed. PT educated patient on increasing posterior hip strength and decreasing ant hip tension to restore proper length/tension relationship to prevent forward "pulling" causing forward trunk lean and LOB. Education on repetition needed for bottom-up training of nerves for DF and carry over of all of this to functional gait. Pt verbalized understanding  Exercises  Seated Calf Stretch with Strap - 60sec hold - 3x daily - 7x weekly  Seated Toe Raise - 15-20 reps - 3x daily - 7x weekly  Sit to Stand without  Arm Support - 10 reps - 3 sets - 1x daily - 7x weekly  Standing Hip Extension with Resistance at Ankles and Counter Support - 10 reps - 3 sets - 1x daily - 7x weekly             PT Education - 11/11/19 0922    Education Details  Patient was educated on diagnosis, anatomy and pathology involved, prognosis, role of PT, and was given an HEP, demonstrating exercise with proper form following verbal and tactile cues, and was given a paper hand out to continue exercise at home. Pt was educated on and agreed to plan of care.    Person(s) Educated  Patient    Methods  Explanation;Demonstration;Tactile cues;Verbal cues;Handout    Comprehension  Verbalized understanding;Returned demonstration;Verbal cues required;Tactile cues required;Need further instruction       PT Short Term Goals - 11/11/19 1039      PT SHORT TERM GOAL #1   Title  Pt will be independent with HEP in order to improve strength and  decrease back pain in order to improve pain-free function at home and work.    Baseline  11/11/19 HEP given    Time  4    Period  Weeks    Status  New        PT Long Term Goals - 11/11/19 1039      PT LONG TERM GOAL #1   Title  Pt will decrease 5TSTS by at least 3 seconds in order to demonstrate clinically significant improvement in LE strength    Baseline  11/11/19 17sec    Time  8    Period  Weeks    Status  New      PT LONG TERM GOAL #2   Title  Pt will increase 10MWT by at least 1.50m/s in order to demonstrate clinically normal community ambulation to decrease fall risk    Baseline  11/11/19 1.75m/s    Time  8    Period  Weeks    Status  New      PT LONG TERM GOAL #3   Title  Patient will demonstrate 10d of active L ankle DF in order to demonstrate mobility needed for foot clearance with ambulation to prevent fall risk    Baseline  11/11/19 -8 degrees    Time  8    Period  Weeks    Status  New      PT LONG TERM GOAL #4   Title  Patient will demonstrate FGA score of 22 or more in order to demonstrate decreased fall risk with dynamic balance    Baseline  11/11/19 20/30    Time  8    Period  Weeks    Status  New      PT LONG TERM GOAL #5   Title  Patient will demonstrate SLS of 10sec or more in order to demonstrate age-matched norm for static balance    Baseline  11/11/19 unable bilat    Time  8    Period  Weeks    Status  New      Additional Long Term Goals   Additional Long Term Goals  Yes            Plan - 11/11/19 1052    Clinical Impression Statement  Pt is a 79 year old male presenting with L foot drop following L3-4 laminectomy 08/14/19. Pertinent history of L4-5 fusion 2014. Impairments in L ankle DF PROM/AROM, hip and core strength, postural  abnomalities, decreased motor control, decreased endurance, and decreased dynamic balance. Activity limitations in prolonged ambulation, bending forward to pick up things, STS transfers, and dynamic gait tasks.  Participation resistrictions in his regular walking regimen with wife (28mins 4-5x/week), and overall QOL d/t increased fall risk. Would benefit from skilled PT to address above deficits and promote optimal return to PLOF.    Personal Factors and Comorbidities  Age;Comorbidity 1;Comorbidity 2;Comorbidity 3+;Time since onset of injury/illness/exacerbation    Comorbidities  arthritis, arrthymia, hx of cancer    Examination-Activity Limitations  Squat;Stairs;Carry;Transfers;Bend    Examination-Participation Restrictions  Laundry;Cleaning;Community Activity    Stability/Clinical Decision Making  Evolving/Moderate complexity    Clinical Decision Making  Moderate    Rehab Potential  Fair    PT Frequency  2x / week    PT Duration  8 weeks    PT Treatment/Interventions  ADLs/Self Care Home Management;Electrical Stimulation;Therapeutic activities;Passive range of motion;Manual techniques;Patient/family education;Therapeutic exercise;DME Instruction;Iontophoresis 4mg /ml Dexamethasone;Gait training;Balance training;Dry needling;Neuromuscular re-education;Stair training;Functional mobility training;Ultrasound;Cryotherapy;Traction;Moist Heat;Aquatic Therapy;Spinal Manipulations    PT Next Visit Plan  hip ext strength, DF mobility, gait training    PT Home Exercise Plan  [id number]    Consulted and Agree with Plan of Care  Patient       Patient will benefit from skilled therapeutic intervention in order to improve the following deficits and impairments:  Decreased activity tolerance, Decreased coordination, Decreased strength, Increased fascial restricitons, Impaired flexibility, Postural dysfunction, Pain, Improper body mechanics, Decreased range of motion, Abnormal gait, Decreased balance, Decreased endurance, Decreased mobility, Difficulty walking  Visit Diagnosis: Foot drop, left  Stiffness of left foot, not elsewhere classified  Difficulty in walking, not elsewhere classified     Problem  List Patient Active Problem List   Diagnosis Date Noted  . Synovial cyst of lumbar facet joint 08/14/2019   Shelton Silvas PT, DPT Shelton Silvas 11/11/2019, 11:05 AM  [facility] [address] [place], [address] Phone: [phone]   Fax:  [phone]  Name: Juan [name] MRN: [id number] Date of Birth: 12/26/1940

## 2019-11-17 ENCOUNTER — Other Ambulatory Visit: Payer: Self-pay

## 2019-11-17 ENCOUNTER — Ambulatory Visit: Payer: PPO | Admitting: Physical Therapy

## 2019-11-17 ENCOUNTER — Encounter: Payer: Self-pay | Admitting: Physical Therapy

## 2019-11-17 DIAGNOSIS — M25675 Stiffness of left foot, not elsewhere classified: Secondary | ICD-10-CM

## 2019-11-17 DIAGNOSIS — R262 Difficulty in walking, not elsewhere classified: Secondary | ICD-10-CM

## 2019-11-17 DIAGNOSIS — M21372 Foot drop, left foot: Secondary | ICD-10-CM | POA: Diagnosis not present

## 2019-11-17 NOTE — Therapy (Addendum)
[place] [facility] [address] [place], [address] Phone: [phone]   Fax:  [phone]  Physical Therapy Treatment  Patient Details  Name: Juan [name] MRN: [id number] Date of Birth: 03-19-1941 No data recorded  Encounter Date: 11/17/2019  PT End of Session - 11/17/19 0942    Visit Number  2    Number of Visits  17    Date for PT Re-Evaluation  01/06/20    PT Start Time  0855    PT Stop Time  0940    PT Time Calculation (min)  45 min    Equipment Utilized During Treatment  Gait belt    Activity Tolerance  Patient tolerated treatment well    Behavior During Therapy  [facility] for tasks assessed/performed       Past Medical History:  Diagnosis Date  . Arthritis    back- low  . Atrial fibrillation ([place])   . Cancer (HCC)    skin ( basal) cell , face, head, back, inside L ear  . Coronary artery disease   . Dysrhythmia   . MI (myocardial infarction) ([place])   . Sleep apnea    +Cpap in use nightly , last study- 10 yrs. ago  . 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 APPENDECTOMY    . BACK SURGERY  09/2011   lumbar fusion   . CARDIAC CATHETERIZATION    . CARDIAC SURGERY    . CARDIAC VALVE REPLACEMENT    . CORONARY ARTERY BYPASS GRAFT    . EYE SURGERY Bilateral    catarascts removed-   . HERNIA REPAIR Bilateral 1946, 1990's   inguinal x3 procedures   . LUMBAR LAMINECTOMY/DECOMPRESSION MICRODISCECTOMY Left 08/14/2019   Procedure: Laminectomy for facet/synovial cyst - left - Lumbar three-Lumbar four;  Surgeon: Earnie Larsson, MD;  Location: [place];  Service: Neurosurgery;  Laterality: Left;  . TONSILLECTOMY      There were no vitals filed for this visit.  Subjective Assessment - 11/17/19 0857    Subjective  Pt HEP program has been going well, he reports some difficulty incorporating it into his schedule, but is working on it. Pt denies any falls since last  visit. Pt is exctied to start PT.    Pertinent History  Pt is a 79 year old male S/P L3-4 laminectomy 08/14/19. Prior to surgery had severe left LE pain and paresthsia that failed conservative intervention. Aim of surgery was to decrease symptoms through resection of synovial cyst on left of L3-4 with decompression. History of L4-5 decompression and fusion for similar symptoms in 2013. Patient reports since this surgery his pain is allieviated which he is happy with. Patient reports his numbess/tingling is gone as well, but that his foot is dropping when he walks and notices he cannot pull his toes up in sitting. He does report that he feels unsteady on his feet and has had some instances where he catches his balance and is falling forward. He thinks he has lost some LE strength as well. He walks in the mall 4-5days/week with his wife about 29mins, enjoys woodworking, and plays Production designer, theatre/television/film. Reports he has some difficulty with raising from a chair, and feels unsteady on his feet but is able to complete all ADLs ind.  Pt denies N/V, B&B changes, unexplained weight fluctuation, saddle paresthesia, fever, night sweats, or unrelenting night pain at this time.    How  long can you sit comfortably?  unlimited    How long can you stand comfortably?  unlimited    How long can you walk comfortably?  22mins    Diagnostic tests  None since surgery    Patient Stated Goals  Walk normally with toe raise         Gait Training -Wobble board for PF/DF motor control 3x15 good carry over for balance and control  -Hurdle heel to toe strike 3x15 with tactile, verbal cues to pull toes up and stay upright, after fatigue less control; cueing for keeping trunk upright and not lurching backwards with heel strike -Reverse lunge to hip flexion drive 3x8 with light touch on bar with great carryover for form, cueing to keep trunk upright with great depth, able to keep toes pulled up in DF -2x300 ft with heavy cueing to prevent  vaulting, standing upright, increasing stride length on R, decreasing stride length on L, increase stance time on L, some carryover, but needs constant cueing; PT CGA  Education on therex carry over into gait training with good understanding                    PT Education - 11/17/19 0912    Education Details  therex form, gait training    Person(s) Educated  Patient    Methods  Explanation;Tactile cues;Verbal cues;Demonstration    Comprehension  Returned demonstration;Verbalized understanding       PT Short Term Goals - 11/11/19 1039      PT SHORT TERM GOAL #1   Title  Pt will be independent with HEP in order to improve strength and decrease back pain in order to improve pain-free function at home and work.    Baseline  11/11/19 HEP given    Time  4    Period  Weeks    Status  New        PT Long Term Goals - 11/11/19 1039      PT LONG TERM GOAL #1   Title  Pt will decrease 5TSTS by at least 3 seconds in order to demonstrate clinically significant improvement in LE strength    Baseline  11/11/19 17sec    Time  8    Period  Weeks    Status  New      PT LONG TERM GOAL #2   Title  Pt will increase 10MWT by at least 1.39m/s in order to demonstrate clinically normal community ambulation to decrease fall risk    Baseline  11/11/19 1.29m/s    Time  8    Period  Weeks    Status  New      PT LONG TERM GOAL #3   Title  Patient will demonstrate 10d of active L ankle DF in order to demonstrate mobility needed for foot clearance with ambulation to prevent fall risk    Baseline  11/11/19 -8 degrees    Time  8    Period  Weeks    Status  New      PT LONG TERM GOAL #4   Title  Patient will demonstrate FGA score of 22 or more in order to demonstrate decreased fall risk with dynamic balance    Baseline  11/11/19 20/30    Time  8    Period  Weeks    Status  New      PT LONG TERM GOAL #5   Title  Patient will demonstrate SLS of 10sec or more in order to  demonstrate  age-matched norm for static balance    Baseline  11/11/19 unable bilat    Time  8    Period  Weeks    Status  New      Additional Long Term Goals   Additional Long Term Goals  Yes            Plan - 11/17/19 0942    Clinical Impression Statement  PT focused on gait training and active DF through dynamic motions with good carryover from pt. Pt is able to learn new motor patterns and has good coordination with support from UE on bar. Pt struggles with SL balance and coordination without UE support during dynamic motions. Overall, pt made good progress during session and shows good rehab potential. PT will continue with progressions as appropriate.    Personal Factors and Comorbidities  Age;Comorbidity 1;Comorbidity 2;Comorbidity 3+;Time since onset of injury/illness/exacerbation    Comorbidities  arthritis, arrthymia, hx of cancer    Examination-Activity Limitations  Squat;Stairs;Carry;Transfers;Bend    Examination-Participation Restrictions  Laundry;Cleaning;Community Activity    Stability/Clinical Decision Making  Evolving/Moderate complexity    Clinical Decision Making  Moderate    Rehab Potential  Fair    PT Frequency  2x / week    PT Duration  8 weeks    PT Treatment/Interventions  ADLs/Self Care Home Management;Electrical Stimulation;Therapeutic activities;Passive range of motion;Manual techniques;Patient/family education;Therapeutic exercise;DME Instruction;Iontophoresis 4mg /ml Dexamethasone;Gait training;Balance training;Dry needling;Neuromuscular re-education;Stair training;Functional mobility training;Ultrasound;Cryotherapy;Traction;Moist Heat;Aquatic Therapy;Spinal Manipulations    PT Next Visit Plan  hip ext strength, DF mobility, gait training    PT Home Exercise Plan  [id number]    Consulted and Agree with Plan of Care  Patient       Patient will benefit from skilled therapeutic intervention in order to improve the following deficits and impairments:  Decreased activity  tolerance, Decreased coordination, Decreased strength, Increased fascial restricitons, Impaired flexibility, Postural dysfunction, Pain, Improper body mechanics, Decreased range of motion, Abnormal gait, Decreased balance, Decreased endurance, Decreased mobility, Difficulty walking  Visit Diagnosis: Foot drop, left  Stiffness of left foot, not elsewhere classified  Difficulty in walking, not elsewhere classified     Problem List Patient Active Problem List   Diagnosis Date Noted  . Synovial cyst of lumbar facet joint 08/14/2019   Shelton Silvas PT, DPT Ivin Booty, SPT Shelton Silvas 11/17/2019, 11:10 AM  [place] [facility] [address], [place], [address] Phone: [phone]   Fax:  [phone]  Name: Haile Childrey MRN: [id number] Date of Birth: 10-26-1940

## 2019-11-24 ENCOUNTER — Encounter: Payer: Self-pay | Admitting: Physical Therapy

## 2019-11-24 ENCOUNTER — Ambulatory Visit: Payer: PPO | Attending: Neurology | Admitting: Physical Therapy

## 2019-11-24 ENCOUNTER — Other Ambulatory Visit: Payer: Self-pay

## 2019-11-24 DIAGNOSIS — R262 Difficulty in walking, not elsewhere classified: Secondary | ICD-10-CM | POA: Insufficient documentation

## 2019-11-24 DIAGNOSIS — M25675 Stiffness of left foot, not elsewhere classified: Secondary | ICD-10-CM | POA: Diagnosis not present

## 2019-11-24 DIAGNOSIS — M21372 Foot drop, left foot: Secondary | ICD-10-CM | POA: Diagnosis not present

## 2019-11-24 NOTE — Therapy (Signed)
[place] [facility] [address], [place], [address] Phone: [phone]   Fax:  [phone]  Physical Therapy Treatment  Patient Details  Name: Juan [name] MRN: [id number] Date of Birth: Jul 03, 1941 No data recorded  Encounter Date: 11/24/2019  PT End of Session - 11/24/19 1305    Visit Number  3    Number of Visits  17    Date for PT Re-Evaluation  01/06/20    PT Start Time  0100    PT Stop Time  0143    PT Time Calculation (min)  43 min    Equipment Utilized During Treatment  Gait belt    Activity Tolerance  Patient tolerated treatment well    Behavior During Therapy  [facility] for tasks assessed/performed       Past Medical History:  Diagnosis Date  . Arthritis    back- low  . Atrial fibrillation ([place])   . Cancer (HCC)    skin ( basal) cell , face, head, back, inside L ear  . Coronary artery disease   . Dysrhythmia   . MI (myocardial infarction) ([place])   . Sleep apnea    +Cpap in use nightly , last study- 10 yrs. ago  . 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 APPENDECTOMY    . BACK SURGERY  09/2011   lumbar fusion   . CARDIAC CATHETERIZATION    . CARDIAC SURGERY    . CARDIAC VALVE REPLACEMENT    . CORONARY ARTERY BYPASS GRAFT    . EYE SURGERY Bilateral    catarascts removed-   . HERNIA REPAIR Bilateral 1946, 1990's   inguinal x3 procedures   . LUMBAR LAMINECTOMY/DECOMPRESSION MICRODISCECTOMY Left 08/14/2019   Procedure: Laminectomy for facet/synovial cyst - left - Lumbar three-Lumbar four;  Surgeon: Earnie Larsson, MD;  Location: [place];  Service: Neurosurgery;  Laterality: Left;  . TONSILLECTOMY      There were no vitals filed for this visit.  Subjective Assessment - 11/24/19 1303    Subjective  Patient reports no pain today, but that he is still having foot drop. Denies any falls. Reports good compliance with HEP    Pertinent History  Pt is a  79 year old male S/P L3-4 laminectomy 08/14/19. Prior to surgery had severe left LE pain and paresthsia that failed conservative intervention. Aim of surgery was to decrease symptoms through resection of synovial cyst on left of L3-4 with decompression. History of L4-5 decompression and fusion for similar symptoms in 2013. Patient reports since this surgery his pain is allieviated which he is happy with. Patient reports his numbess/tingling is gone as well, but that his foot is dropping when he walks and notices he cannot pull his toes up in sitting. He does report that he feels unsteady on his feet and has had some instances where he catches his balance and is falling forward. He thinks he has lost some LE strength as well. He walks in the mall 4-5days/week with his wife about 82mins, enjoys woodworking, and plays Production designer, theatre/television/film. Reports he has some difficulty with raising from a chair, and feels unsteady on his feet but is able to complete all ADLs ind.  Pt denies N/V, B&B changes, unexplained weight fluctuation, saddle paresthesia, fever, night sweats, or unrelenting night pain at this time.    How long can you sit comfortably?  unlimited    How long can  you stand comfortably?  unlimited    How long can you walk comfortably?  65mins    Diagnostic tests  None since surgery    Patient Stated Goals  Walk normally with toe raise      Ther-Ex - Nustep L4 68mins with cuing for "pulling" into DF  -Wobble board for PF/DF motor control 2x15 good carry over of cuing for speed for balance and control  -Reverse lunge to hip flexion drive with DF [id number] with light touch on bar with great carryover for form, able to DF well  - Hip flex with DF with YTB anchored by opposite foot 3x 10; attempted RTB too difficult, difficult to maintain  - Heel walk 4x 62ft with CGA for balance and decent heel strike, decreased DF maintained between steps - Toe raise in standing with minimal range; modified to from step 3x 09/07/14 with  near to neutral heel raise   Gait Training 1.19mph 73mins; 1.17mph on 3% incline 3 mins; cuing throughout for heel strike, utilizing sounds of the treadmill to increase this and even step length with good carry over                      PT Education - 11/24/19 1305    Education Details  therex form, gait training    Person(s) Educated  Patient    Methods  Explanation;Demonstration;Verbal cues    Comprehension  Verbalized understanding;Returned demonstration;Verbal cues required       PT Short Term Goals - 11/11/19 1039      PT SHORT TERM GOAL #1   Title  Pt will be independent with HEP in order to improve strength and decrease back pain in order to improve pain-free function at home and work.    Baseline  11/11/19 HEP given    Time  4    Period  Weeks    Status  New        PT Long Term Goals - 11/11/19 1039      PT LONG TERM GOAL #1   Title  Pt will decrease 5TSTS by at least 3 seconds in order to demonstrate clinically significant improvement in LE strength    Baseline  11/11/19 17sec    Time  8    Period  Weeks    Status  New      PT LONG TERM GOAL #2   Title  Pt will increase 10MWT by at least 1.30m/s in order to demonstrate clinically normal community ambulation to decrease fall risk    Baseline  11/11/19 1.69m/s    Time  8    Period  Weeks    Status  New      PT LONG TERM GOAL #3   Title  Patient will demonstrate 10d of active L ankle DF in order to demonstrate mobility needed for foot clearance with ambulation to prevent fall risk    Baseline  11/11/19 -8 degrees    Time  8    Period  Weeks    Status  New      PT LONG TERM GOAL #4   Title  Patient will demonstrate FGA score of 22 or more in order to demonstrate decreased fall risk with dynamic balance    Baseline  11/11/19 20/30    Time  8    Period  Weeks    Status  New      PT LONG TERM GOAL #5   Title  Patient will demonstrate SLS of  10sec or more in order to demonstrate age-matched norm for  static balance    Baseline  11/11/19 unable bilat    Time  8    Period  Weeks    Status  New      Additional Long Term Goals   Additional Long Term Goals  Yes            Plan - 11/24/19 1318    Clinical Impression Statement  PT continued therex progression for increased DF with compound therex with success. Patient is able to comply with all cuing for porper technique/form. PT continued carry over into gait training with addition of incline walking for more heel strike demand with good carry over. PT will continue progression as able.    Personal Factors and Comorbidities  Age;Comorbidity 1;Comorbidity 2;Comorbidity 3+;Time since onset of injury/illness/exacerbation    Comorbidities  arthritis, arrthymia, hx of cancer    Examination-Activity Limitations  Squat;Stairs;Carry;Transfers;Bend    Examination-Participation Restrictions  Laundry;Cleaning;Community Activity    Stability/Clinical Decision Making  Evolving/Moderate complexity    Clinical Decision Making  Moderate    Rehab Potential  Fair    PT Frequency  2x / week    PT Duration  8 weeks    PT Treatment/Interventions  ADLs/Self Care Home Management;Electrical Stimulation;Therapeutic activities;Passive range of motion;Manual techniques;Patient/family education;Therapeutic exercise;DME Instruction;Iontophoresis 4mg /ml Dexamethasone;Gait training;Balance training;Dry needling;Neuromuscular re-education;Stair training;Functional mobility training;Ultrasound;Cryotherapy;Traction;Moist Heat;Aquatic Therapy;Spinal Manipulations    PT Next Visit Plan  hip ext strength, DF mobility, gait training    PT Home Exercise Plan  [id number]    Consulted and Agree with Plan of Care  Patient       Patient will benefit from skilled therapeutic intervention in order to improve the following deficits and impairments:  Decreased activity tolerance, Decreased coordination, Decreased strength, Increased fascial restricitons, Impaired flexibility,  Postural dysfunction, Pain, Improper body mechanics, Decreased range of motion, Abnormal gait, Decreased balance, Decreased endurance, Decreased mobility, Difficulty walking  Visit Diagnosis: Foot drop, left  Stiffness of left foot, not elsewhere classified  Difficulty in walking, not elsewhere classified     Problem List Patient Active Problem List   Diagnosis Date Noted  . Synovial cyst of lumbar facet joint 08/14/2019   Shelton Silvas PT, DPT Shelton Silvas 11/24/2019, 2:19 PM  [facility] [address], [place], [address] Phone: [phone]   Fax:  [phone]  Name: Yosiah Hairr MRN: [id number] Date of Birth: 02/22/1941

## 2019-11-26 ENCOUNTER — Ambulatory Visit: Payer: PPO | Admitting: Physical Therapy

## 2019-11-26 ENCOUNTER — Other Ambulatory Visit: Payer: Self-pay

## 2019-11-26 ENCOUNTER — Encounter: Payer: Self-pay | Admitting: Physical Therapy

## 2019-11-26 DIAGNOSIS — R262 Difficulty in walking, not elsewhere classified: Secondary | ICD-10-CM

## 2019-11-26 DIAGNOSIS — M25675 Stiffness of left foot, not elsewhere classified: Secondary | ICD-10-CM

## 2019-11-26 DIAGNOSIS — M21372 Foot drop, left foot: Secondary | ICD-10-CM

## 2019-11-26 NOTE — Therapy (Signed)
[facility] [address], [place], [address] Phone: [phone]   Fax:  [phone]  Physical Therapy Treatment  Patient Details  Name: Juan [name] MRN: [id number] Date of Birth: September 11, 1941 No data recorded  Encounter Date: 11/26/2019  PT End of Session - 11/26/19 1035    Visit Number  4    Number of Visits  17    Date for PT Re-Evaluation  01/06/20    PT Start Time  1030    PT Stop Time  1110    PT Time Calculation (min)  40 min    Equipment Utilized During Treatment  Gait belt    Activity Tolerance  Patient tolerated treatment well    Behavior During Therapy  [facility] for tasks assessed/performed       Past Medical History:  Diagnosis Date  . Arthritis    back- low  . Atrial fibrillation ([place])   . Cancer (HCC)    skin ( basal) cell , face, head, back, inside L ear  . Coronary artery disease   . Dysrhythmia   . MI (myocardial infarction) ([place])   . Sleep apnea    +Cpap in use nightly , last study- 10 yrs. ago  . 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 APPENDECTOMY    . BACK SURGERY  09/2011   lumbar fusion   . CARDIAC CATHETERIZATION    . CARDIAC SURGERY    . CARDIAC VALVE REPLACEMENT    . CORONARY ARTERY BYPASS GRAFT    . EYE SURGERY Bilateral    catarascts removed-   . HERNIA REPAIR Bilateral 1946, 1990's   inguinal x3 procedures   . LUMBAR LAMINECTOMY/DECOMPRESSION MICRODISCECTOMY Left 08/14/2019   Procedure: Laminectomy for facet/synovial cyst - left - Lumbar three-Lumbar four;  Surgeon: Earnie Larsson, MD;  Location: [place];  Service: Neurosurgery;  Laterality: Left;  . TONSILLECTOMY      There were no vitals filed for this visit.  Subjective Assessment - 11/26/19 1033    Subjective  Patient reports no today, and minimal soreness following last session. Reports some HEP compliance.    Pertinent History  Pt is a 79 year old male S/P  L3-4 laminectomy 08/14/19. Prior to surgery had severe left LE pain and paresthsia that failed conservative intervention. Aim of surgery was to decrease symptoms through resection of synovial cyst on left of L3-4 with decompression. History of L4-5 decompression and fusion for similar symptoms in 2013. Patient reports since this surgery his pain is allieviated which he is happy with. Patient reports his numbess/tingling is gone as well, but that his foot is dropping when he walks and notices he cannot pull his toes up in sitting. He does report that he feels unsteady on his feet and has had some instances where he catches his balance and is falling forward. He thinks he has lost some LE strength as well. He walks in the mall 4-5days/week with his wife about 75mins, enjoys woodworking, and plays Production designer, theatre/television/film. Reports he has some difficulty with raising from a chair, and feels unsteady on his feet but is able to complete all ADLs ind.  Pt denies N/V, B&B changes, unexplained weight fluctuation, saddle paresthesia, fever, night sweats, or unrelenting night pain at this time.    How long can you sit comfortably?  unlimited    How long can you stand comfortably?  unlimited  How long can you walk comfortably?  56mins    Diagnostic tests  None since surgery    Patient Stated Goals  Walk normally with toe raise        Ther-Ex - Nustep L4 38mins with cuing for "pulling" into DF  - Toe raise in standing with minimal range; modified to from step 3x 15 with near to neutral heel raise - Tandem walks forward and backward x3 20ft line with CGA for safety occasional minA needed to maintain balance - Narrow BOS BUE flexion yellow theraball rotations x8 each; R tandem x8 each; L tandem x8 each  - Ball toss and catch on foam pad x10 with narrow BOS; R semi tandem; L semi tandem with chair behind for safety 2x instances of stepping strate -Wobble board for PF/DF motor control 2x15 good carry over of cuing for speed for  balance and control    Gait Training 1.44mph 66mins; 1.43mph on 3% incline 4 mins; cuing throughout for heel strike, utilizing sounds of the treadmill to increase this and even step length with good carry over 3 hurdle negotation over 140ft with good balance, patient able to use hip flex to negotiate L foot over, with obvious attempt at AROM DF                        PT Education - 11/26/19 1034    Education Details  therex form, gait training    Person(s) Educated  Patient    Methods  Explanation;Demonstration;Tactile cues;Verbal cues    Comprehension  Verbalized understanding;Returned demonstration;Tactile cues required;Verbal cues required       PT Short Term Goals - 11/11/19 1039      PT SHORT TERM GOAL #1   Title  Pt will be independent with HEP in order to improve strength and decrease back pain in order to improve pain-free function at home and work.    Baseline  11/11/19 HEP given    Time  4    Period  Weeks    Status  New        PT Long Term Goals - 11/11/19 1039      PT LONG TERM GOAL #1   Title  Pt will decrease 5TSTS by at least 3 seconds in order to demonstrate clinically significant improvement in LE strength    Baseline  11/11/19 17sec    Time  8    Period  Weeks    Status  New      PT LONG TERM GOAL #2   Title  Pt will increase 10MWT by at least 1.63m/s in order to demonstrate clinically normal community ambulation to decrease fall risk    Baseline  11/11/19 1.10m/s    Time  8    Period  Weeks    Status  New      PT LONG TERM GOAL #3   Title  Patient will demonstrate 10d of active L ankle DF in order to demonstrate mobility needed for foot clearance with ambulation to prevent fall risk    Baseline  11/11/19 -8 degrees    Time  8    Period  Weeks    Status  New      PT LONG TERM GOAL #4   Title  Patient will demonstrate FGA score of 22 or more in order to demonstrate decreased fall risk with dynamic balance    Baseline  11/11/19 20/30     Time  8  Period  Weeks    Status  New      PT LONG TERM GOAL #5   Title  Patient will demonstrate SLS of 10sec or more in order to demonstrate age-matched norm for static balance    Baseline  11/11/19 unable bilat    Time  8    Period  Weeks    Status  New      Additional Long Term Goals   Additional Long Term Goals  Yes            Plan - 11/26/19 1133    Clinical Impression Statement  PT continued therex progression for increased ankle DF motion and activation with carry over into ambulation with additional balance and propriception focus this session. Patient is motivated throughout session and is able to complete all therex with proper technique following cuing/corrections. PT will continue progression as able.    Personal Factors and Comorbidities  Age;Comorbidity 1;Comorbidity 2;Comorbidity 3+;Time since onset of injury/illness/exacerbation    Comorbidities  arthritis, arrthymia, hx of cancer    Examination-Activity Limitations  Squat;Stairs;Carry;Transfers;Bend    Examination-Participation Restrictions  Laundry;Cleaning;Community Activity    Stability/Clinical Decision Making  Evolving/Moderate complexity    Clinical Decision Making  Moderate    Rehab Potential  Fair    PT Frequency  2x / week    PT Duration  8 weeks    PT Treatment/Interventions  ADLs/Self Care Home Management;Electrical Stimulation;Therapeutic activities;Passive range of motion;Manual techniques;Patient/family education;Therapeutic exercise;DME Instruction;Iontophoresis 4mg /ml Dexamethasone;Gait training;Balance training;Dry needling;Neuromuscular re-education;Stair training;Functional mobility training;Ultrasound;Cryotherapy;Traction;Moist Heat;Aquatic Therapy;Spinal Manipulations    PT Next Visit Plan  hip ext strength, DF mobility, gait training    PT Home Exercise Plan  [id number]    Consulted and Agree with Plan of Care  Patient       Patient will benefit from skilled therapeutic intervention in  order to improve the following deficits and impairments:  Decreased activity tolerance, Decreased coordination, Decreased strength, Increased fascial restricitons, Impaired flexibility, Postural dysfunction, Pain, Improper body mechanics, Decreased range of motion, Abnormal gait, Decreased balance, Decreased endurance, Decreased mobility, Difficulty walking  Visit Diagnosis: Foot drop, left  Stiffness of left foot, not elsewhere classified  Difficulty in walking, not elsewhere classified     Problem List Patient Active Problem List   Diagnosis Date Noted  . Synovial cyst of lumbar facet joint 08/14/2019   Shelton Silvas PT, DPT Shelton Silvas 11/26/2019, 11:39 AM  [place] [address], [place], [address] Phone: [phone]   Fax:  [phone]  Name: Juan [name] MRN: [id number] Date of Birth: 1941-04-17

## 2019-12-01 ENCOUNTER — Other Ambulatory Visit: Payer: Self-pay

## 2019-12-01 ENCOUNTER — Encounter: Payer: Self-pay | Admitting: Physical Therapy

## 2019-12-01 ENCOUNTER — Ambulatory Visit: Payer: PPO | Admitting: Physical Therapy

## 2019-12-01 DIAGNOSIS — M21372 Foot drop, left foot: Secondary | ICD-10-CM | POA: Diagnosis not present

## 2019-12-01 DIAGNOSIS — R262 Difficulty in walking, not elsewhere classified: Secondary | ICD-10-CM

## 2019-12-01 DIAGNOSIS — M25675 Stiffness of left foot, not elsewhere classified: Secondary | ICD-10-CM

## 2019-12-01 NOTE — Therapy (Signed)
[facility] [address], [place], [address] Phone: [phone]   Fax:  [phone]  Physical Therapy Treatment  Patient Details  Name: Juan [name] MRN: [id number] Date of Birth: 1941-06-15 No data recorded  Encounter Date: 12/01/2019  PT End of Session - 12/01/19 1120    Visit Number  5    Number of Visits  17    Date for PT Re-Evaluation  01/06/20    PT Start Time  1115    PT Stop Time  1155    PT Time Calculation (min)  40 min    Equipment Utilized During Treatment  Gait belt    Activity Tolerance  Patient tolerated treatment well    Behavior During Therapy  [facility] for tasks assessed/performed       Past Medical History:  Diagnosis Date  . Arthritis    back- low  . Atrial fibrillation ([place])   . Cancer (HCC)    skin ( basal) cell , face, head, back, inside L ear  . Coronary artery disease   . Dysrhythmia   . MI (myocardial infarction) ([place])   . Sleep apnea    +Cpap in use nightly , last study- 10 yrs. ago  . 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 APPENDECTOMY    . BACK SURGERY  09/2011   lumbar fusion   . CARDIAC CATHETERIZATION    . CARDIAC SURGERY    . CARDIAC VALVE REPLACEMENT    . CORONARY ARTERY BYPASS GRAFT    . EYE SURGERY Bilateral    catarascts removed-   . HERNIA REPAIR Bilateral 1946, 1990's   inguinal x3 procedures   . LUMBAR LAMINECTOMY/DECOMPRESSION MICRODISCECTOMY Left 08/14/2019   Procedure: Laminectomy for facet/synovial cyst - left - Lumbar three-Lumbar four;  Surgeon: Earnie Larsson, MD;  Location: [place];  Service: Neurosurgery;  Laterality: Left;  . TONSILLECTOMY      There were no vitals filed for this visit.  Subjective Assessment - 12/01/19 1116    Subjective  Patient reports no pain over the weekend, and thinks his foot drop is getting better. Reports he does have some cramping in bilat calves in the am.     Pertinent History  Pt is a 79 year old male S/P L3-4 laminectomy 08/14/19. Prior to surgery had severe left LE pain and paresthsia that failed conservative intervention. Aim of surgery was to decrease symptoms through resection of synovial cyst on left of L3-4 with decompression. History of L4-5 decompression and fusion for similar symptoms in 2013. Patient reports since this surgery his pain is allieviated which he is happy with. Patient reports his numbess/tingling is gone as well, but that his foot is dropping when he walks and notices he cannot pull his toes up in sitting. He does report that he feels unsteady on his feet and has had some instances where he catches his balance and is falling forward. He thinks he has lost some LE strength as well. He walks in the mall 4-5days/week with his wife about 39mins, enjoys woodworking, and plays Production designer, theatre/television/film. Reports he has some difficulty with raising from a chair, and feels unsteady on his feet but is able to complete all ADLs ind.  Pt denies N/V, B&B changes, unexplained weight fluctuation, saddle paresthesia, fever, night sweats, or unrelenting night pain at this time.    How long can you sit comfortably?  unlimited  How long can you stand comfortably?  unlimited    How long can you walk comfortably?  31mins    Diagnostic tests  None since surgery    Patient Stated Goals  Walk normally with toe raise          Ther-Ex - Bilat gastroc stretch on small inclinc 2x 80min hold - Alt step off onto heel strike from small wedge 2x 10 each LE step off with min correction of RLE alignment with LLE step off  - DF from incline board 3sec hold 3x 12 heavy fatigue in final reps that is most apparent in last set - Ball toss and catch R semi tandem x10; L semi tandem x10 (PT throwing ball slightly R and L to BOS)  - Ball toss and catch on foam pad R semi tandem x10; L semi tandem x10 with chair behind for safety  - Tandem walks forward and backward x61ft line with CGA  for safety; On foam x8ft (69ftFWD/5ftBWS/5ftFWD) - Nustep L5 35mins with cuing for "pulling" into DF  Gait Training 1.30mph 6mins; 1.38mph on 3% incline 53mins mins; 1.32mph with 5% incline cuing throughout for heel strike, utilizing sounds of the treadmill to increase this and even step length with good carry over. On 5%-7% grade increased cuing needed for increased L step length 3 hurdle negotation over 181ft with good balance, patient able to use hip flex to negotiate L foot over, with obvious attempt at AROM DF                      PT Education - 12/01/19 1119    Education Details  therex form, gait training    Person(s) Educated  Patient    Methods  Explanation;Demonstration;Tactile cues;Verbal cues;Handout    Comprehension  Verbalized understanding;Returned demonstration;Verbal cues required;Tactile cues required       PT Short Term Goals - 11/11/19 1039      PT SHORT TERM GOAL #1   Title  Pt will be independent with HEP in order to improve strength and decrease back pain in order to improve pain-free function at home and work.    Baseline  11/11/19 HEP given    Time  4    Period  Weeks    Status  New        PT Long Term Goals - 11/11/19 1039      PT LONG TERM GOAL #1   Title  Pt will decrease 5TSTS by at least 3 seconds in order to demonstrate clinically significant improvement in LE strength    Baseline  11/11/19 17sec    Time  8    Period  Weeks    Status  New      PT LONG TERM GOAL #2   Title  Pt will increase 10MWT by at least 1.31m/s in order to demonstrate clinically normal community ambulation to decrease fall risk    Baseline  11/11/19 1.32m/s    Time  8    Period  Weeks    Status  New      PT LONG TERM GOAL #3   Title  Patient will demonstrate 10d of active L ankle DF in order to demonstrate mobility needed for foot clearance with ambulation to prevent fall risk    Baseline  11/11/19 -8 degrees    Time  8    Period  Weeks    Status  New       PT LONG TERM GOAL #4  Title  Patient will demonstrate FGA score of 22 or more in order to demonstrate decreased fall risk with dynamic balance    Baseline  11/11/19 20/30    Time  8    Period  Weeks    Status  New      PT LONG TERM GOAL #5   Title  Patient will demonstrate SLS of 10sec or more in order to demonstrate age-matched norm for static balance    Baseline  11/11/19 unable bilat    Time  8    Period  Weeks    Status  New      Additional Long Term Goals   Additional Long Term Goals  Yes            Plan - 12/01/19 1145    Clinical Impression Statement  PT continued therex progression for increased DF activation, and proprioception with good success. Patient is able to comply with all cuing for proper muscle activation and therex technique. Patient is motivated throughout session and is making progress every week. PT will cotninue progression as able.    Personal Factors and Comorbidities  Age;Comorbidity 1;Comorbidity 2;Comorbidity 3+;Time since onset of injury/illness/exacerbation    Comorbidities  arthritis, arrthymia, hx of cancer    Examination-Activity Limitations  Squat;Stairs;Carry;Transfers;Bend    Examination-Participation Restrictions  Laundry;Cleaning;Community Activity    Stability/Clinical Decision Making  Evolving/Moderate complexity    Clinical Decision Making  Moderate    Rehab Potential  Fair    PT Frequency  2x / week    PT Duration  8 weeks    PT Treatment/Interventions  ADLs/Self Care Home Management;Electrical Stimulation;Therapeutic activities;Passive range of motion;Manual techniques;Patient/family education;Therapeutic exercise;DME Instruction;Iontophoresis 4mg /ml Dexamethasone;Gait training;Balance training;Dry needling;Neuromuscular re-education;Stair training;Functional mobility training;Ultrasound;Cryotherapy;Traction;Moist Heat;Aquatic Therapy;Spinal Manipulations    PT Next Visit Plan  hip ext strength, DF mobility, gait training    PT Home  Exercise Plan  [id number]    Consulted and Agree with Plan of Care  Patient       Patient will benefit from skilled therapeutic intervention in order to improve the following deficits and impairments:  Decreased activity tolerance, Decreased coordination, Decreased strength, Increased fascial restricitons, Impaired flexibility, Postural dysfunction, Pain, Improper body mechanics, Decreased range of motion, Abnormal gait, Decreased balance, Decreased endurance, Decreased mobility, Difficulty walking  Visit Diagnosis: Foot drop, left  Stiffness of left foot, not elsewhere classified  Difficulty in walking, not elsewhere classified     Problem List Patient Active Problem List   Diagnosis Date Noted  . Synovial cyst of lumbar facet joint 08/14/2019   Shelton Silvas PT, DPT Shelton Silvas 12/01/2019, 11:55 AM  [place] [facility] [address], [place], [address] Phone: [phone]   Fax:  [phone]  Name: Juan [name] MRN: [id number] Date of Birth: Nov 12, 1940

## 2019-12-03 ENCOUNTER — Encounter: Payer: Self-pay | Admitting: Physical Therapy

## 2019-12-03 ENCOUNTER — Other Ambulatory Visit: Payer: Self-pay

## 2019-12-03 ENCOUNTER — Ambulatory Visit: Payer: PPO | Admitting: Physical Therapy

## 2019-12-03 DIAGNOSIS — M21372 Foot drop, left foot: Secondary | ICD-10-CM | POA: Diagnosis not present

## 2019-12-03 DIAGNOSIS — R262 Difficulty in walking, not elsewhere classified: Secondary | ICD-10-CM

## 2019-12-03 DIAGNOSIS — M25675 Stiffness of left foot, not elsewhere classified: Secondary | ICD-10-CM

## 2019-12-03 NOTE — Therapy (Signed)
[facility] [address], [place], [address] Phone: [phone]   Fax:  [phone]  Physical Therapy Treatment  Patient Details  Name: Juan [name] MRN: [id number] Date of Birth: Nov 28, 1940 No data recorded  Encounter Date: 12/03/2019  PT End of Session - 12/03/19 1036    Visit Number  6    Number of Visits  17    Date for PT Re-Evaluation  01/06/20    PT Start Time  1030    PT Stop Time  1113    PT Time Calculation (min)  43 min    Equipment Utilized During Treatment  Gait belt    Activity Tolerance  Patient tolerated treatment well    Behavior During Therapy  [facility] for tasks assessed/performed       Past Medical History:  Diagnosis Date  . Arthritis    back- low  . Atrial fibrillation ([place])   . Cancer (HCC)    skin ( basal) cell , face, head, back, inside L ear  . Coronary artery disease   . Dysrhythmia   . MI (myocardial infarction) ([place])   . Sleep apnea    +Cpap in use nightly , last study- 10 yrs. ago  . 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 APPENDECTOMY    . BACK SURGERY  09/2011   lumbar fusion   . CARDIAC CATHETERIZATION    . CARDIAC SURGERY    . CARDIAC VALVE REPLACEMENT    . CORONARY ARTERY BYPASS GRAFT    . EYE SURGERY Bilateral    catarascts removed-   . HERNIA REPAIR Bilateral 1946, 1990's   inguinal x3 procedures   . LUMBAR LAMINECTOMY/DECOMPRESSION MICRODISCECTOMY Left 08/14/2019   Procedure: Laminectomy for facet/synovial cyst - left - Lumbar three-Lumbar four;  Surgeon: Earnie Larsson, MD;  Location: [place];  Service: Neurosurgery;  Laterality: Left;  . TONSILLECTOMY      There were no vitals filed for this visit.  Subjective Assessment - 12/03/19 1033    Subjective  Patient reports he did well after last session. Reports compliance with HEP.    Pertinent History  Pt is a 79 year old male S/P L3-4 laminectomy  08/14/19. Prior to surgery had severe left LE pain and paresthsia that failed conservative intervention. Aim of surgery was to decrease symptoms through resection of synovial cyst on left of L3-4 with decompression. History of L4-5 decompression and fusion for similar symptoms in 2013. Patient reports since this surgery his pain is allieviated which he is happy with. Patient reports his numbess/tingling is gone as well, but that his foot is dropping when he walks and notices he cannot pull his toes up in sitting. He does report that he feels unsteady on his feet and has had some instances where he catches his balance and is falling forward. He thinks he has lost some LE strength as well. He walks in the mall 4-5days/week with his wife about 57mins, enjoys woodworking, and plays Production designer, theatre/television/film. Reports he has some difficulty with raising from a chair, and feels unsteady on his feet but is able to complete all ADLs ind.  Pt denies N/V, B&B changes, unexplained weight fluctuation, saddle paresthesia, fever, night sweats, or unrelenting night pain at this time.    How long can you sit comfortably?  unlimited    How long can you stand comfortably?  unlimited    How  long can you walk comfortably?  24mins    Diagnostic tests  None since surgery    Patient Stated Goals  Walk normally with toe raise       Ther-Ex - Bilat gastroc stretch on small inclinc 2x 20min hold - DF from incline board 3sec hold 3x 12 with cuing for eccentric control, fatigue at the end of sets - Alt forward lunge onto large balance stone to step back 3x 8 with cuing for DF with L step back with good carry over; without UE support CGA, 1x instance for minA to reestablish balance - Alt heel taps onto 12in 3x 12 with increased difficulty without UE support and cuing to "tap egg" on stool; increased difficulty with L SLS - Biodex limits of stability Trial 1: Medium level 13% in 22min 26sec;  Trial 1: Medium level 21% in 55min 38sec; increased  difficulty with L shifting, needing handrail for most L wt shifting - Nustep L6 3mins with cuing for "pulling" into DF SPM above 60  Gait Training 1.49mph 53mins; 1.31mph on 3% incline66minsmins; 1.36mph with 5% incline; 1.28mph on 7% incline23minsmins cuing throughout for heel strike, utilizing sounds of the treadmill to increase this and even step length with good carry over. On 5%-7% grade increased cuing needed for increased L step length                         PT Education - 12/03/19 1034    Education Details  therex form, gait training    Person(s) Educated  Patient    Methods  Explanation;Demonstration;Tactile cues;Verbal cues    Comprehension  Verbalized understanding;Returned demonstration;Verbal cues required;Tactile cues required       PT Short Term Goals - 11/11/19 1039      PT SHORT TERM GOAL #1   Title  Pt will be independent with HEP in order to improve strength and decrease back pain in order to improve pain-free function at home and work.    Baseline  11/11/19 HEP given    Time  4    Period  Weeks    Status  New        PT Long Term Goals - 11/11/19 1039      PT LONG TERM GOAL #1   Title  Pt will decrease 5TSTS by at least 3 seconds in order to demonstrate clinically significant improvement in LE strength    Baseline  11/11/19 17sec    Time  8    Period  Weeks    Status  New      PT LONG TERM GOAL #2   Title  Pt will increase 10MWT by at least 1.33m/s in order to demonstrate clinically normal community ambulation to decrease fall risk    Baseline  11/11/19 1.57m/s    Time  8    Period  Weeks    Status  New      PT LONG TERM GOAL #3   Title  Patient will demonstrate 10d of active L ankle DF in order to demonstrate mobility needed for foot clearance with ambulation to prevent fall risk    Baseline  11/11/19 -8 degrees    Time  8    Period  Weeks    Status  New      PT LONG TERM GOAL #4   Title  Patient will demonstrate FGA score of 22  or more in order to demonstrate decreased fall risk with dynamic balance  Baseline  11/11/19 20/30    Time  8    Period  Weeks    Status  New      PT LONG TERM GOAL #5   Title  Patient will demonstrate SLS of 10sec or more in order to demonstrate age-matched norm for static balance    Baseline  11/11/19 unable bilat    Time  8    Period  Weeks    Status  New      Additional Long Term Goals   Additional Long Term Goals  Yes            Plan - 12/03/19 1042    Clinical Impression Statement  PT continued therex progression with success. Patient is increasing DF tolerance with increased time for fatigue. Is increasing proprioception with balance activities as well. Patient is motivated throughout session and complies with all cuing for technique, and needs CGA, occassional minA to maintain balance with success. PT will continue progression as able.    Personal Factors and Comorbidities  Age;Comorbidity 1;Comorbidity 2;Comorbidity 3+;Time since onset of injury/illness/exacerbation    Comorbidities  arthritis, arrthymia, hx of cancer    Examination-Activity Limitations  Squat;Stairs;Carry;Transfers;Bend    Examination-Participation Restrictions  Laundry;Cleaning;Community Activity    Stability/Clinical Decision Making  Evolving/Moderate complexity    Clinical Decision Making  Moderate    Rehab Potential  Fair    PT Frequency  2x / week    PT Duration  8 weeks    PT Treatment/Interventions  ADLs/Self Care Home Management;Electrical Stimulation;Therapeutic activities;Passive range of motion;Manual techniques;Patient/family education;Therapeutic exercise;DME Instruction;Iontophoresis 4mg /ml Dexamethasone;Gait training;Balance training;Dry needling;Neuromuscular re-education;Stair training;Functional mobility training;Ultrasound;Cryotherapy;Traction;Moist Heat;Aquatic Therapy;Spinal Manipulations    PT Next Visit Plan  hip ext strength, DF mobility, gait training    PT Home Exercise Plan   [id number]    Consulted and Agree with Plan of Care  Patient       Patient will benefit from skilled therapeutic intervention in order to improve the following deficits and impairments:  Decreased activity tolerance, Decreased coordination, Decreased strength, Increased fascial restricitons, Impaired flexibility, Postural dysfunction, Pain, Improper body mechanics, Decreased range of motion, Abnormal gait, Decreased balance, Decreased endurance, Decreased mobility, Difficulty walking  Visit Diagnosis: Foot drop, left  Stiffness of left foot, not elsewhere classified  Difficulty in walking, not elsewhere classified     Problem List Patient Active Problem List   Diagnosis Date Noted  . Synovial cyst of lumbar facet joint 08/14/2019   Shelton Silvas PT, DPT Shelton Silvas 12/03/2019, 11:10 AM  [place] [facility] [address] [place], [address] Phone: [phone]   Fax:  [phone]  Name: Juan [name] MRN: [id number] Date of Birth: 1941/06/02

## 2019-12-08 ENCOUNTER — Ambulatory Visit: Payer: PPO | Admitting: Physical Therapy

## 2019-12-08 ENCOUNTER — Encounter: Payer: Self-pay | Admitting: Physical Therapy

## 2019-12-08 ENCOUNTER — Other Ambulatory Visit: Payer: Self-pay

## 2019-12-08 DIAGNOSIS — M21372 Foot drop, left foot: Secondary | ICD-10-CM | POA: Diagnosis not present

## 2019-12-08 DIAGNOSIS — R262 Difficulty in walking, not elsewhere classified: Secondary | ICD-10-CM

## 2019-12-08 DIAGNOSIS — M25675 Stiffness of left foot, not elsewhere classified: Secondary | ICD-10-CM

## 2019-12-08 NOTE — Therapy (Signed)
[place] [facility] [address] [place], [address] Phone: [phone]   Fax:  [phone]  Physical Therapy Treatment  Patient Details  Name: Juan [name] MRN: [id number] Date of Birth: 11/05/1940 No data recorded  Encounter Date: 12/08/2019  PT End of Session - 12/08/19 1124    Visit Number  7    Number of Visits  17    Date for PT Re-Evaluation  01/06/20    PT Start Time  1118    PT Stop Time  1156    PT Time Calculation (min)  38 min    Equipment Utilized During Treatment  Gait belt    Activity Tolerance  Patient tolerated treatment well    Behavior During Therapy  [facility] for tasks assessed/performed       Past Medical History:  Diagnosis Date  . Arthritis    back- low  . Atrial fibrillation ([place])   . Cancer (HCC)    skin ( basal) cell , face, head, back, inside L ear  . Coronary artery disease   . Dysrhythmia   . MI (myocardial infarction) ([place])   . Sleep apnea    +Cpap in use nightly , last study- 10 yrs. ago  . 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 APPENDECTOMY    . BACK SURGERY  09/2011   lumbar fusion   . CARDIAC CATHETERIZATION    . CARDIAC SURGERY    . CARDIAC VALVE REPLACEMENT    . CORONARY ARTERY BYPASS GRAFT    . EYE SURGERY Bilateral    catarascts removed-   . HERNIA REPAIR Bilateral 1946, 1990's   inguinal x3 procedures   . LUMBAR LAMINECTOMY/DECOMPRESSION MICRODISCECTOMY Left 08/14/2019   Procedure: Laminectomy for facet/synovial cyst - left - Lumbar three-Lumbar four;  Surgeon: Earnie Larsson, MD;  Location: [place];  Service: Neurosurgery;  Laterality: Left;  . TONSILLECTOMY      There were no vitals filed for this visit.  Subjective Assessment - 12/08/19 1117    Subjective  Patient reports he did well after last session. Reports some compliance with HEP. Reports he thinks the foot drop is steadily improving.    Pertinent  History  Pt is a 79 year old male S/P L3-4 laminectomy 08/14/19. Prior to surgery had severe left LE pain and paresthsia that failed conservative intervention. Aim of surgery was to decrease symptoms through resection of synovial cyst on left of L3-4 with decompression. History of L4-5 decompression and fusion for similar symptoms in 2013. Patient reports since this surgery his pain is allieviated which he is happy with. Patient reports his numbess/tingling is gone as well, but that his foot is dropping when he walks and notices he cannot pull his toes up in sitting. He does report that he feels unsteady on his feet and has had some instances where he catches his balance and is falling forward. He thinks he has lost some LE strength as well. He walks in the mall 4-5days/week with his wife about 74mins, enjoys woodworking, and plays Production designer, theatre/television/film. Reports he has some difficulty with raising from a chair, and feels unsteady on his feet but is able to complete all ADLs ind.  Pt denies N/V, B&B changes, unexplained weight fluctuation, saddle paresthesia, fever, night sweats, or unrelenting night pain at this time.    How long can you sit comfortably?  unlimited    How long  can you stand comfortably?  unlimited    How long can you walk comfortably?  63mins    Diagnostic tests  None since surgery    Patient Stated Goals  Walk normally with toe raise       Ther-Ex -Bilat gastroc stretch on small inclinc 2x 91min hold - DF walks x56ft; 3x 25ft with CGA for safety; aftigue about about 57ft causing inability to maintain DF - Alt heel taps onto 12in standing on foam 3x 12 with increased difficulty with 2 finger support and cuing to "tap egg" on stool; increased difficulty with L SLS - Biodex limits of stability Trial 1: Medium level 10% in 10min 7sec;  Trial 2: Medium level 22% in 54min 15sec; Trial 3: Medium level 21% in 35min 12sec;  increased difficulty with L shifting, needing handrail for most L wt shifting -Nustep  L24mins with cuing for "pulling" into DF SPM above 60  Gait Training 1.75mph 72mins; 1.42mph on 4% incline40minsmins;1.5mph with 6% incline; 1.58mph on 7% incline11minsminscuing throughout for heel strike, utilizing sounds of the treadmill to increase this and even step length with good carry over. Better carry over of cuing with less toe drag at higher inclines                         PT Education - 12/08/19 1123    Education Details  therex form, gait training    Person(s) Educated  Patient    Methods  Explanation;Demonstration;Tactile cues;Verbal cues    Comprehension  Verbalized understanding;Returned demonstration;Verbal cues required;Tactile cues required       PT Short Term Goals - 11/11/19 1039      PT SHORT TERM GOAL #1   Title  Pt will be independent with HEP in order to improve strength and decrease back pain in order to improve pain-free function at home and work.    Baseline  11/11/19 HEP given    Time  4    Period  Weeks    Status  New        PT Long Term Goals - 11/11/19 1039      PT LONG TERM GOAL #1   Title  Pt will decrease 5TSTS by at least 3 seconds in order to demonstrate clinically significant improvement in LE strength    Baseline  11/11/19 17sec    Time  8    Period  Weeks    Status  New      PT LONG TERM GOAL #2   Title  Pt will increase 10MWT by at least 1.42m/s in order to demonstrate clinically normal community ambulation to decrease fall risk    Baseline  11/11/19 1.56m/s    Time  8    Period  Weeks    Status  New      PT LONG TERM GOAL #3   Title  Patient will demonstrate 10d of active L ankle DF in order to demonstrate mobility needed for foot clearance with ambulation to prevent fall risk    Baseline  11/11/19 -8 degrees    Time  8    Period  Weeks    Status  New      PT LONG TERM GOAL #4   Title  Patient will demonstrate FGA score of 22 or more in order to demonstrate decreased fall risk with dynamic balance     Baseline  11/11/19 20/30    Time  8    Period  Weeks  Status  New      PT LONG TERM GOAL #5   Title  Patient will demonstrate SLS of 10sec or more in order to demonstrate age-matched norm for static balance    Baseline  11/11/19 unable bilat    Time  8    Period  Weeks    Status  New      Additional Long Term Goals   Additional Long Term Goals  Yes            Plan - 12/08/19 1154    Clinical Impression Statement  PT continued therex progression for increased DF activation and L foot proprioception with success. Patient is improving in DF activaiton in strength reps, but is continuing to have difficulty with endurance ranges, and increased distances. PT will continue progression to improve this as able.    Personal Factors and Comorbidities  Age;Comorbidity 1;Comorbidity 2;Comorbidity 3+;Time since onset of injury/illness/exacerbation    Comorbidities  arthritis, arrthymia, hx of cancer    Examination-Activity Limitations  Squat;Stairs;Carry;Transfers;Bend    Examination-Participation Restrictions  Laundry;Cleaning;Community Activity    Stability/Clinical Decision Making  Evolving/Moderate complexity    Clinical Decision Making  Moderate    Rehab Potential  Fair    PT Frequency  2x / week    PT Duration  8 weeks    PT Treatment/Interventions  ADLs/Self Care Home Management;Electrical Stimulation;Therapeutic activities;Passive range of motion;Manual techniques;Patient/family education;Therapeutic exercise;DME Instruction;Iontophoresis 4mg /ml Dexamethasone;Gait training;Balance training;Dry needling;Neuromuscular re-education;Stair training;Functional mobility training;Ultrasound;Cryotherapy;Traction;Moist Heat;Aquatic Therapy;Spinal Manipulations    PT Next Visit Plan  hip ext strength, DF mobility, gait training    PT Home Exercise Plan  [id number]    Consulted and Agree with Plan of Care  Patient       Patient will benefit from skilled therapeutic intervention in order to  improve the following deficits and impairments:  Decreased activity tolerance, Decreased coordination, Decreased strength, Increased fascial restricitons, Impaired flexibility, Postural dysfunction, Pain, Improper body mechanics, Decreased range of motion, Abnormal gait, Decreased balance, Decreased endurance, Decreased mobility, Difficulty walking  Visit Diagnosis: Foot drop, left  Stiffness of left foot, not elsewhere classified  Difficulty in walking, not elsewhere classified     Problem List Patient Active Problem List   Diagnosis Date Noted  . Synovial cyst of lumbar facet joint 08/14/2019   Shelton Silvas PT, DPT Shelton Silvas 12/08/2019, 11:57 AM  [place] [facility] [address], [place], [address] Phone: [phone]   Fax:  [phone]  Name: Juan [name] MRN: [id number] Date of Birth: 23-Aug-1941

## 2019-12-10 ENCOUNTER — Other Ambulatory Visit: Payer: Self-pay

## 2019-12-10 ENCOUNTER — Encounter: Payer: Self-pay | Admitting: Physical Therapy

## 2019-12-10 ENCOUNTER — Ambulatory Visit: Payer: PPO | Admitting: Physical Therapy

## 2019-12-10 DIAGNOSIS — M25675 Stiffness of left foot, not elsewhere classified: Secondary | ICD-10-CM

## 2019-12-10 DIAGNOSIS — M21372 Foot drop, left foot: Secondary | ICD-10-CM

## 2019-12-10 DIAGNOSIS — R262 Difficulty in walking, not elsewhere classified: Secondary | ICD-10-CM

## 2019-12-10 NOTE — Therapy (Signed)
[facility] [address] [place], [address] Phone: [phone]   Fax:  [phone]  Physical Therapy Treatment  Patient Details  Name: Juan [name] MRN: [id number] Date of Birth: [birth year] No data recorded  Encounter Date: 12/10/2019  PT End of Session - 12/10/19 1126    Visit Number  8    Number of Visits  17    Date for PT Re-Evaluation  01/06/20    PT Start Time  1117    PT Stop Time  1155    PT Time Calculation (min)  38 min    Equipment Utilized During Treatment  Gait belt    Activity Tolerance  Patient tolerated treatment well    Behavior During Therapy  [facility] for tasks assessed/performed       Past Medical History:  Diagnosis Date  . Arthritis    back- low  . Atrial fibrillation ([place])   . Cancer (HCC)    skin ( basal) cell , face, head, back, inside L ear  . Coronary artery disease   . Dysrhythmia   . MI (myocardial infarction) ([place])   . Sleep apnea    +Cpap in use nightly , last study- 10 yrs. ago  . 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 APPENDECTOMY    . BACK SURGERY  09/2011   lumbar fusion   . CARDIAC CATHETERIZATION    . CARDIAC SURGERY    . CARDIAC VALVE REPLACEMENT    . CORONARY ARTERY BYPASS GRAFT    . EYE SURGERY Bilateral    catarascts removed-   . HERNIA REPAIR Bilateral 1946, 1990's   inguinal x3 procedures   . LUMBAR LAMINECTOMY/DECOMPRESSION MICRODISCECTOMY Left 08/14/2019   Procedure: Laminectomy for facet/synovial cyst - left - Lumbar three-Lumbar four;  Surgeon: Earnie Larsson, MD;  Location: [place];  Service: Neurosurgery;  Laterality: Left;  . TONSILLECTOMY      There were no vitals filed for this visit.  Subjective Assessment - 12/10/19 1123    Subjective  Patient walked at the mall this morning with his wife and did not have any instances of toe drag or tripping. No pain today, compliance with HEP    Pertinent History  Pt is a 79 year old male S/P L3-4 laminectomy 08/14/19. Prior to surgery had severe left LE pain and paresthsia that failed conservative intervention. Aim of surgery was to decrease symptoms through resection of synovial cyst on left of L3-4 with decompression. History of L4-5 decompression and fusion for similar symptoms in 2013. Patient reports since this surgery his pain is allieviated which he is happy with. Patient reports his numbess/tingling is gone as well, but that his foot is dropping when he walks and notices he cannot pull his toes up in sitting. He does report that he feels unsteady on his feet and has had some instances where he catches his balance and is falling forward. He thinks he has lost some LE strength as well. He walks in the mall 4-5days/week with his wife about 14mins, enjoys woodworking, and plays Production designer, theatre/television/film. Reports he has some difficulty with raising from a chair, and feels unsteady on his feet but is able to complete all ADLs ind.  Pt denies N/V, B&B changes, unexplained weight fluctuation, saddle paresthesia, fever, night sweats, or unrelenting night pain at this time.    How long can you sit comfortably?  unlimited  How long can you stand comfortably?  unlimited    How long can you walk comfortably?  32mins    Diagnostic tests  None since surgery    Patient Stated Goals  Walk normally with toe raise       Ther-Ex -Bilat gastroc stretch on small inclinc 2x 36min hold - DF walks x84ft; 3x 33ft with CGA for safety; aftigue about about 37ft causing inability to maintain DF - Alt forward step down/heel tap from small incline x15 with cuing for LE alignment with good carry over; from large incline 2x 10 with fatigue following 10  - DF walks 35ft x4 with CGA for safety  -Biodex limits of stability Trial 1: Medium level 20% in 9min 25sec; Trial 2: Medium level 40% in 84min 3sec; Trial 3: Medium level 56% in 48sec;  increased difficulty with L shifting, needing  handrail for most L wt shifting -Nustep L82mins with cuing for "pulling" into DFSPM above 60  Gait Training 1.4mph 21mins; 1.14mph on 4% incline53minsmins;1.5mph with 6% incline;1.15mph on8% incline41minsminscuing throughout for heel strike, utilizing sounds of the treadmill to increase this and even step length with good carry over. Better carry over of cuing with less toe drag at higher inclines                         PT Education - 12/10/19 1125    Education Details  therex form, gait training    Person(s) Educated  Patient    Methods  Explanation;Demonstration;Tactile cues;Verbal cues    Comprehension  Verbalized understanding;Returned demonstration;Verbal cues required;Tactile cues required       PT Short Term Goals - 11/11/19 1039      PT SHORT TERM GOAL #1   Title  Pt will be independent with HEP in order to improve strength and decrease back pain in order to improve pain-free function at home and work.    Baseline  11/11/19 HEP given    Time  4    Period  Weeks    Status  New        PT Long Term Goals - 11/11/19 1039      PT LONG TERM GOAL #1   Title  Pt will decrease 5TSTS by at least 3 seconds in order to demonstrate clinically significant improvement in LE strength    Baseline  11/11/19 17sec    Time  8    Period  Weeks    Status  New      PT LONG TERM GOAL #2   Title  Pt will increase 10MWT by at least 1.46m/s in order to demonstrate clinically normal community ambulation to decrease fall risk    Baseline  11/11/19 1.1m/s    Time  8    Period  Weeks    Status  New      PT LONG TERM GOAL #3   Title  Patient will demonstrate 10d of active L ankle DF in order to demonstrate mobility needed for foot clearance with ambulation to prevent fall risk    Baseline  11/11/19 -8 degrees    Time  8    Period  Weeks    Status  New      PT LONG TERM GOAL #4   Title  Patient will demonstrate FGA score of 22 or more in order to demonstrate  decreased fall risk with dynamic balance    Baseline  11/11/19 20/30    Time  8  Period  Weeks    Status  New      PT LONG TERM GOAL #5   Title  Patient will demonstrate SLS of 10sec or more in order to demonstrate age-matched norm for static balance    Baseline  11/11/19 unable bilat    Time  8    Period  Weeks    Status  New      Additional Long Term Goals   Additional Long Term Goals  Yes            Plan - 12/10/19 1130    Clinical Impression Statement  PT continued therex progression for increased motor control and DF activation with success. patient demonstrating increased fatigue with progression, but is able to complete all planned therex with planned rests. Patinet requires cuing for technique and gaurding for safety. PT will continue progression as able.    Personal Factors and Comorbidities  Age;Comorbidity 1;Comorbidity 2;Comorbidity 3+;Time since onset of injury/illness/exacerbation    Comorbidities  arthritis, arrthymia, hx of cancer    Examination-Activity Limitations  Squat;Stairs;Carry;Transfers;Bend    Examination-Participation Restrictions  Laundry;Cleaning;Community Activity    Stability/Clinical Decision Making  Evolving/Moderate complexity    Clinical Decision Making  Moderate    Rehab Potential  Fair    PT Frequency  2x / week    PT Duration  8 weeks    PT Treatment/Interventions  ADLs/Self Care Home Management;Electrical Stimulation;Therapeutic activities;Passive range of motion;Manual techniques;Patient/family education;Therapeutic exercise;DME Instruction;Iontophoresis 4mg /ml Dexamethasone;Gait training;Balance training;Dry needling;Neuromuscular re-education;Stair training;Functional mobility training;Ultrasound;Cryotherapy;Traction;Moist Heat;Aquatic Therapy;Spinal Manipulations    PT Next Visit Plan  hip ext strength, DF mobility, gait training    PT Home Exercise Plan  [id number]    Consulted and Agree with Plan of Care  Patient       Patient will  benefit from skilled therapeutic intervention in order to improve the following deficits and impairments:  Decreased activity tolerance, Decreased coordination, Decreased strength, Increased fascial restricitons, Impaired flexibility, Postural dysfunction, Pain, Improper body mechanics, Decreased range of motion, Abnormal gait, Decreased balance, Decreased endurance, Decreased mobility, Difficulty walking  Visit Diagnosis: Foot drop, left  Stiffness of left foot, not elsewhere classified  Difficulty in walking, not elsewhere classified     Problem List Patient Active Problem List   Diagnosis Date Noted  . Synovial cyst of lumbar facet joint 08/14/2019   Shelton Silvas PT, DPT Shelton Silvas 12/10/2019, 12:03 PM  [place] [facility] [facility] [address], [place], [address] Phone: [phone]   Fax:  [phone]  Name: Cache Jacoby MRN: [id number] Date of Birth: Nov 23, 1940

## 2019-12-14 ENCOUNTER — Other Ambulatory Visit: Payer: Self-pay

## 2019-12-14 ENCOUNTER — Ambulatory Visit: Payer: PPO | Admitting: Physical Therapy

## 2019-12-14 ENCOUNTER — Encounter: Payer: Self-pay | Admitting: Physical Therapy

## 2019-12-14 DIAGNOSIS — M21372 Foot drop, left foot: Secondary | ICD-10-CM

## 2019-12-14 DIAGNOSIS — M25675 Stiffness of left foot, not elsewhere classified: Secondary | ICD-10-CM

## 2019-12-14 DIAGNOSIS — R262 Difficulty in walking, not elsewhere classified: Secondary | ICD-10-CM

## 2019-12-14 NOTE — Therapy (Signed)
[facility] [address] [place], [address] Phone: [phone]   Fax:  [phone]  Physical Therapy Treatment  Patient Details  Name: Juan [name] MRN: [id number] Date of Birth: [birth year] No data recorded  Encounter Date: 12/14/2019  PT End of Session - 12/14/19 0946    Visit Number  9    Number of Visits  17    Date for PT Re-Evaluation  01/06/20    PT Start Time  0945    PT Stop Time  1025    PT Time Calculation (min)  40 min    Equipment Utilized During Treatment  Gait belt    Activity Tolerance  Patient tolerated treatment well    Behavior During Therapy  [facility] for tasks assessed/performed       Past Medical History:  Diagnosis Date  . Arthritis    back- low  . Atrial fibrillation ([place])   . Cancer (HCC)    skin ( basal) cell , face, head, back, inside L ear  . Coronary artery disease   . Dysrhythmia   . MI (myocardial infarction) ([place])   . Sleep apnea    +Cpap in use nightly , last study- 10 yrs. ago  . 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 APPENDECTOMY    . BACK SURGERY  09/2011   lumbar fusion   . CARDIAC CATHETERIZATION    . CARDIAC SURGERY    . CARDIAC VALVE REPLACEMENT    . CORONARY ARTERY BYPASS GRAFT    . EYE SURGERY Bilateral    catarascts removed-   . HERNIA REPAIR Bilateral 1946, 1990's   inguinal x3 procedures   . LUMBAR LAMINECTOMY/DECOMPRESSION MICRODISCECTOMY Left 08/14/2019   Procedure: Laminectomy for facet/synovial cyst - left - Lumbar three-Lumbar four;  Surgeon: Earnie Larsson, MD;  Location: [place];  Service: Neurosurgery;  Laterality: Left;  . TONSILLECTOMY      There were no vitals filed for this visit.  Subjective Assessment - 12/14/19 0944    Subjective  Patient reports no falls and very minimal issues with mall walking over the weekend. Reports no pain today.    Pertinent History  Pt is a 79 year old  male S/P L3-4 laminectomy 08/14/19. Prior to surgery had severe left LE pain and paresthsia that failed conservative intervention. Aim of surgery was to decrease symptoms through resection of synovial cyst on left of L3-4 with decompression. History of L4-5 decompression and fusion for similar symptoms in 2013. Patient reports since this surgery his pain is allieviated which he is happy with. Patient reports his numbess/tingling is gone as well, but that his foot is dropping when he walks and notices he cannot pull his toes up in sitting. He does report that he feels unsteady on his feet and has had some instances where he catches his balance and is falling forward. He thinks he has lost some LE strength as well. He walks in the mall 4-5days/week with his wife about 99mins, enjoys woodworking, and plays Production designer, theatre/television/film. Reports he has some difficulty with raising from a chair, and feels unsteady on his feet but is able to complete all ADLs ind.  Pt denies N/V, B&B changes, unexplained weight fluctuation, saddle paresthesia, fever, night sweats, or unrelenting night pain at this time.    How long can you sit comfortably?  unlimited    How long can you stand comfortably?  unlimited    How long can you walk comfortably?  35mins    Diagnostic tests  None since surgery    Patient Stated Goals  Walk normally with toe raise       Ther-Ex - DF walks 51ft x4 with CGA for safety, able to complete well for first 9ft, with drop after 29ft - Gastroc stretch 2x 47min on large slant board -Biodex limits of stability Trial 1: Hard level 14% in110min43sec; Trial2: Hard level17%in 79min 32sec; Trial3: Hard level16% in40sec; increased difficulty with L and posterior wt shifting, able to complete without UEs -Nustep L71.74mins; L6 18min; L5 1.57mins with cuing for "pulling" into DFSPM above 60, decreased intensity needed to maintain  Gait Training 1.43mph 80mins; 1.20mph on4% incline60minsmins;1.5mph with6%  incline;1.15mph on8% incline61minsminscuing throughout for heel strike, utilizing sounds of the treadmill to increase this and even step length, no instances of toe drag - Tandem walking 2x 30ft (66ft down 66ft back); and 2x 13ft (52ft FWD, 64ft BKWD) with CGA for BKWD, supervision for forward for safety, patient able to correct missteps to reestablish balance Ambulation 270ft with speed changes called out at random with supervision, some delay in gait with speed changes 175ft with horizontal head turns 159ft with vertical head turns with very minimal gait disturbance, supervision for safety.                         PT Education - 12/14/19 0945    Education Details  therex form; gait training    Person(s) Educated  Patient    Methods  Explanation;Demonstration;Tactile cues;Verbal cues    Comprehension  Verbalized understanding;Returned demonstration;Verbal cues required;Tactile cues required       PT Short Term Goals - 11/11/19 1039      PT SHORT TERM GOAL #1   Title  Pt will be independent with HEP in order to improve strength and decrease back pain in order to improve pain-free function at home and work.    Baseline  11/11/19 HEP given    Time  4    Period  Weeks    Status  New        PT Long Term Goals - 11/11/19 1039      PT LONG TERM GOAL #1   Title  Pt will decrease 5TSTS by at least 3 seconds in order to demonstrate clinically significant improvement in LE strength    Baseline  11/11/19 17sec    Time  8    Period  Weeks    Status  New      PT LONG TERM GOAL #2   Title  Pt will increase 10MWT by at least 1.51m/s in order to demonstrate clinically normal community ambulation to decrease fall risk    Baseline  11/11/19 1.75m/s    Time  8    Period  Weeks    Status  New      PT LONG TERM GOAL #3   Title  Patient will demonstrate 10d of active L ankle DF in order to demonstrate mobility needed for foot clearance with ambulation to prevent fall risk     Baseline  11/11/19 -8 degrees    Time  8    Period  Weeks    Status  New      PT LONG TERM GOAL #4   Title  Patient will demonstrate FGA score of 22 or more in order to demonstrate decreased fall risk with dynamic balance    Baseline  11/11/19 20/30    Time  8    Period  Weeks    Status  New      PT LONG TERM GOAL #5   Title  Patient will demonstrate SLS of 10sec or more in order to demonstrate age-matched norm for static balance    Baseline  11/11/19 unable bilat    Time  8    Period  Weeks    Status  New      Additional Long Term Goals   Additional Long Term Goals  Yes            Plan - 12/14/19 0948    Clinical Impression Statement  PT continued therex progression for increased DF activaiton and motor control, and balance/proprioception. Patient is motivated throughout session and is able to comoply with all cuing for technique, though he is unable to maintain DF activation for prolonged time d/t fatigue. PT will continue progression as able.    Personal Factors and Comorbidities  Age;Comorbidity 1;Comorbidity 2;Comorbidity 3+;Time since onset of injury/illness/exacerbation    Comorbidities  arthritis, arrthymia, hx of cancer    Examination-Activity Limitations  Squat;Stairs;Carry;Transfers;Bend    Examination-Participation Restrictions  Laundry;Cleaning;Community Activity    Stability/Clinical Decision Making  Evolving/Moderate complexity    Clinical Decision Making  Moderate    Rehab Potential  Fair    PT Frequency  2x / week    PT Duration  8 weeks    PT Treatment/Interventions  ADLs/Self Care Home Management;Electrical Stimulation;Therapeutic activities;Passive range of motion;Manual techniques;Patient/family education;Therapeutic exercise;DME Instruction;Iontophoresis 4mg /ml Dexamethasone;Gait training;Balance training;Dry needling;Neuromuscular re-education;Stair training;Functional mobility training;Ultrasound;Cryotherapy;Traction;Moist Heat;Aquatic Therapy;Spinal  Manipulations    PT Next Visit Plan  hip ext strength, DF mobility, gait training    PT Home Exercise Plan  [id number]    Consulted and Agree with Plan of Care  Patient       Patient will benefit from skilled therapeutic intervention in order to improve the following deficits and impairments:  Decreased activity tolerance, Decreased coordination, Decreased strength, Increased fascial restricitons, Impaired flexibility, Postural dysfunction, Pain, Improper body mechanics, Decreased range of motion, Abnormal gait, Decreased balance, Decreased endurance, Decreased mobility, Difficulty walking  Visit Diagnosis: Foot drop, left  Stiffness of left foot, not elsewhere classified  Difficulty in walking, not elsewhere classified     Problem List Patient Active Problem List   Diagnosis Date Noted  . Synovial cyst of lumbar facet joint 08/14/2019   Shelton Silvas PT, DPT Shelton Silvas 12/14/2019, 10:22 AM  [facility] [facility] [address] [place], [address] Phone: [phone]   Fax:  [phone]  Name: Juan [name] MRN: [id number] Date of Birth: 10-25-1940

## 2019-12-17 ENCOUNTER — Ambulatory Visit: Payer: PPO | Admitting: Physical Therapy

## 2019-12-17 ENCOUNTER — Encounter: Payer: Self-pay | Admitting: Physical Therapy

## 2019-12-17 ENCOUNTER — Other Ambulatory Visit: Payer: Self-pay

## 2019-12-17 DIAGNOSIS — M21372 Foot drop, left foot: Secondary | ICD-10-CM

## 2019-12-17 DIAGNOSIS — M25675 Stiffness of left foot, not elsewhere classified: Secondary | ICD-10-CM

## 2019-12-17 DIAGNOSIS — R262 Difficulty in walking, not elsewhere classified: Secondary | ICD-10-CM

## 2019-12-17 NOTE — Therapy (Signed)
[facility] [address] [place], [address] Phone: [phone]   Fax:  [phone]  Physical Therapy Treatment  Patient Details  Name: Juan [name] MRN: [id number] Date of Birth: [birth year] No data recorded  Encounter Date: 12/17/2019  PT End of Session - 12/17/19 1136    Visit Number  10    Number of Visits  17    Date for PT Re-Evaluation  01/06/20    PT Start Time  1116    PT Stop Time  1155    PT Time Calculation (min)  39 min    Equipment Utilized During Treatment  Gait belt    Activity Tolerance  Patient tolerated treatment well    Behavior During Therapy  [facility] for tasks assessed/performed       Past Medical History:  Diagnosis Date  . Arthritis    back- low  . Atrial fibrillation ([place])   . Cancer (HCC)    skin ( basal) cell , face, head, back, inside L ear  . Coronary artery disease   . Dysrhythmia   . MI (myocardial infarction) ([place])   . Sleep apnea    +Cpap in use nightly , last study- 10 yrs. ago  . 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 APPENDECTOMY    . BACK SURGERY  09/2011   lumbar fusion   . CARDIAC CATHETERIZATION    . CARDIAC SURGERY    . CARDIAC VALVE REPLACEMENT    . CORONARY ARTERY BYPASS GRAFT    . EYE SURGERY Bilateral    catarascts removed-   . HERNIA REPAIR Bilateral 1946, 1990's   inguinal x3 procedures   . LUMBAR LAMINECTOMY/DECOMPRESSION MICRODISCECTOMY Left 08/14/2019   Procedure: Laminectomy for facet/synovial cyst - left - Lumbar three-Lumbar four;  Surgeon: Earnie Larsson, MD;  Location: [place];  Service: Neurosurgery;  Laterality: Left;  . TONSILLECTOMY      There were no vitals filed for this visit.  Subjective Assessment - 12/17/19 1124    Subjective  Patient reports he feels like he is making good progress overall and he thinks the DF activation is "just going to take some time". His walks around  the mall are getting easier and he reports good compliance with HEP.    Pertinent History  Pt is a 79 year old male S/P L3-4 laminectomy 08/14/19. Prior to surgery had severe left LE pain and paresthsia that failed conservative intervention. Aim of surgery was to decrease symptoms through resection of synovial cyst on left of L3-4 with decompression. History of L4-5 decompression and fusion for similar symptoms in 2013. Patient reports since this surgery his pain is allieviated which he is happy with. Patient reports his numbess/tingling is gone as well, but that his foot is dropping when he walks and notices he cannot pull his toes up in sitting. He does report that he feels unsteady on his feet and has had some instances where he catches his balance and is falling forward. He thinks he has lost some LE strength as well. He walks in the mall 4-5days/week with his wife about 59mins, enjoys woodworking, and plays Production designer, theatre/television/film. Reports he has some difficulty with raising from a chair, and feels unsteady on his feet but is able to complete all ADLs ind.  Pt denies N/V, B&B changes, unexplained weight fluctuation, saddle paresthesia, fever, night sweats, or unrelenting night pain at this  time.    How long can you sit comfortably?  unlimited    How long can you stand comfortably?  unlimited    How long can you walk comfortably?  81mins    Diagnostic tests  None since surgery    Patient Stated Goals  Walk normally with toe raise       Ther-Ex -Reverse lunge to hip flex with DF 3x 12 bilat; some difficulty with DF at end of reps - SLS LLE with unilateral support 30sec; with 2 finger support 30sec; multiple attempts without UE support with difficulty - SLS LLE on foam with 37finger support 2x 30sec  RLE STS about 5 sec - DF walks 32ft x4 with CGA for safety, able to complete well for first 107ft, with drop after 87ft -Biodex limits of stability Trial 1: Hard level14% in47min41sec; Trial2: Hard level39%in  43min10sec; Trial3: Hard level44% in59sec; increased difficulty with L and posterior wt shifting, able to complete without UEs -Nustep L79mins with pulling into DF, above 50spm fairly consistently   Gait Training 1.28mph 14mins; 1.7mph on4% incline43minsmins;1.8mph with6% incline;1.11mph on8% incline69minsminscuing throughout for heel strike, utilizing sounds of the treadmill to increase this and even step length, no instances of toe drag                         PT Education - 12/17/19 1126    Education Details  therex form, gait training    Person(s) Educated  Patient    Methods  Explanation;Demonstration;Tactile cues;Verbal cues    Comprehension  Verbalized understanding;Returned demonstration;Verbal cues required;Tactile cues required       PT Short Term Goals - 11/11/19 1039      PT SHORT TERM GOAL #1   Title  Pt will be independent with HEP in order to improve strength and decrease back pain in order to improve pain-free function at home and work.    Baseline  11/11/19 HEP given    Time  4    Period  Weeks    Status  New        PT Long Term Goals - 11/11/19 1039      PT LONG TERM GOAL #1   Title  Pt will decrease 5TSTS by at least 3 seconds in order to demonstrate clinically significant improvement in LE strength    Baseline  11/11/19 17sec    Time  8    Period  Weeks    Status  New      PT LONG TERM GOAL #2   Title  Pt will increase 10MWT by at least 1.44m/s in order to demonstrate clinically normal community ambulation to decrease fall risk    Baseline  11/11/19 1.82m/s    Time  8    Period  Weeks    Status  New      PT LONG TERM GOAL #3   Title  Patient will demonstrate 10d of active L ankle DF in order to demonstrate mobility needed for foot clearance with ambulation to prevent fall risk    Baseline  11/11/19 -8 degrees    Time  8    Period  Weeks    Status  New      PT LONG TERM GOAL #4   Title  Patient will demonstrate FGA  score of 22 or more in order to demonstrate decreased fall risk with dynamic balance    Baseline  11/11/19 20/30    Time  8    Period  Weeks    Status  New      PT LONG TERM GOAL #5   Title  Patient will demonstrate SLS of 10sec or more in order to demonstrate age-matched norm for static balance    Baseline  11/11/19 unable bilat    Time  8    Period  Weeks    Status  New      Additional Long Term Goals   Additional Long Term Goals  Yes            Plan - 12/17/19 1151    Clinical Impression Statement  PT continued therex and balance progression with good success. Patient is able to complete all progressions with good technique following cuing and planned rest breaks. PT encouraged patient ot note ADLs that he is having trouble with this weekend for possible re-assessment next week; pt verbalizes understanding. PT will continue progression as able.    Personal Factors and Comorbidities  Age;Comorbidity 1;Comorbidity 2;Comorbidity 3+;Time since onset of injury/illness/exacerbation    Comorbidities  arthritis, arrthymia, hx of cancer    Examination-Activity Limitations  Squat;Stairs;Carry;Transfers;Bend    Examination-Participation Restrictions  Laundry;Cleaning;Community Activity    Stability/Clinical Decision Making  Evolving/Moderate complexity    Clinical Decision Making  Moderate    Rehab Potential  Fair    PT Frequency  2x / week    PT Duration  8 weeks    PT Treatment/Interventions  ADLs/Self Care Home Management;Electrical Stimulation;Therapeutic activities;Passive range of motion;Manual techniques;Patient/family education;Therapeutic exercise;DME Instruction;Iontophoresis 4mg /ml Dexamethasone;Gait training;Balance training;Dry needling;Neuromuscular re-education;Stair training;Functional mobility training;Ultrasound;Cryotherapy;Traction;Moist Heat;Aquatic Therapy;Spinal Manipulations    PT Next Visit Plan  hip ext strength, DF mobility, gait training    PT Home Exercise Plan   [id number]    Consulted and Agree with Plan of Care  Patient       Patient will benefit from skilled therapeutic intervention in order to improve the following deficits and impairments:  Decreased activity tolerance, Decreased coordination, Decreased strength, Increased fascial restricitons, Impaired flexibility, Postural dysfunction, Pain, Improper body mechanics, Decreased range of motion, Abnormal gait, Decreased balance, Decreased endurance, Decreased mobility, Difficulty walking  Visit Diagnosis: Foot drop, left  Stiffness of left foot, not elsewhere classified  Difficulty in walking, not elsewhere classified     Problem List Patient Active Problem List   Diagnosis Date Noted  . Synovial cyst of lumbar facet joint 08/14/2019   Shelton Silvas PT, DPT Shelton Silvas 12/17/2019, 11:54 AM  [place] [address], [place], [address] Phone: [phone]   Fax:  [phone]  Name: Juan [name] MRN: [id number] Date of Birth: [birth year]

## 2019-12-21 ENCOUNTER — Other Ambulatory Visit: Payer: Self-pay

## 2019-12-21 ENCOUNTER — Encounter: Payer: Self-pay | Admitting: Physical Therapy

## 2019-12-21 ENCOUNTER — Ambulatory Visit: Payer: PPO | Admitting: Physical Therapy

## 2019-12-21 DIAGNOSIS — M21372 Foot drop, left foot: Secondary | ICD-10-CM | POA: Diagnosis not present

## 2019-12-21 DIAGNOSIS — M25675 Stiffness of left foot, not elsewhere classified: Secondary | ICD-10-CM

## 2019-12-21 DIAGNOSIS — R262 Difficulty in walking, not elsewhere classified: Secondary | ICD-10-CM

## 2019-12-21 NOTE — Therapy (Signed)
[place] [address] [place], [address] Phone: [phone]   Fax:  [phone]  Physical Therapy Treatment  Patient Details  Name: Juan [name] MRN: [id number] Date of Birth: [birth year] No data recorded  Encounter Date: 12/21/2019  PT End of Session - 12/21/19 1437    Visit Number  11    Number of Visits  17    Date for PT Re-Evaluation  01/06/20    PT Start Time  0233    PT Stop Time  0311    PT Time Calculation (min)  38 min    Equipment Utilized During Treatment  Gait belt    Activity Tolerance  Patient tolerated treatment well    Behavior During Therapy  [facility] for tasks assessed/performed       Past Medical History:  Diagnosis Date  . Arthritis    back- low  . Atrial fibrillation ([place])   . Cancer (HCC)    skin ( basal) cell , face, head, back, inside L ear  . Coronary artery disease   . Dysrhythmia   . MI (myocardial infarction) ([place])   . Sleep apnea    +Cpap in use nightly , last study- 10 yrs. ago  . 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 APPENDECTOMY    . BACK SURGERY  09/2011   lumbar fusion   . CARDIAC CATHETERIZATION    . CARDIAC SURGERY    . CARDIAC VALVE REPLACEMENT    . CORONARY ARTERY BYPASS GRAFT    . EYE SURGERY Bilateral    catarascts removed-   . HERNIA REPAIR Bilateral 1946, 1990's   inguinal x3 procedures   . LUMBAR LAMINECTOMY/DECOMPRESSION MICRODISCECTOMY Left 08/14/2019   Procedure: Laminectomy for facet/synovial cyst - left - Lumbar three-Lumbar four;  Surgeon: Earnie Larsson, MD;  Location: [place];  Service: Neurosurgery;  Laterality: Left;  . TONSILLECTOMY      There were no vitals filed for this visit.  Subjective Assessment - 12/21/19 1435    Subjective  Patient is continuing to increase walking distance with wife, and reports compliance with his HEP. Reports he feels like he may be able to discharge  PT this week. Reports no more calf cramping which is an improvement    Pertinent History  Pt is a 79 year old male S/P L3-4 laminectomy 08/14/19. Prior to surgery had severe left LE pain and paresthsia that failed conservative intervention. Aim of surgery was to decrease symptoms through resection of synovial cyst on left of L3-4 with decompression. History of L4-5 decompression and fusion for similar symptoms in 2013. Patient reports since this surgery his pain is allieviated which he is happy with. Patient reports his numbess/tingling is gone as well, but that his foot is dropping when he walks and notices he cannot pull his toes up in sitting. He does report that he feels unsteady on his feet and has had some instances where he catches his balance and is falling forward. He thinks he has lost some LE strength as well. He walks in the mall 4-5days/week with his wife about 80mins, enjoys woodworking, and plays Production designer, theatre/television/film. Reports he has some difficulty with raising from a chair, and feels unsteady on his feet but is able to complete all ADLs ind.  Pt denies N/V, B&B changes, unexplained weight fluctuation, saddle paresthesia, fever, night sweats, or unrelenting night pain at this time.  How long can you sit comfortably?  unlimited    How long can you stand comfortably?  unlimited    How long can you walk comfortably?  84mins    Diagnostic tests  None since surgery    Patient Stated Goals  Walk normally with toe raise       Ther-Ex -Reverse lunge to hip flex with DF 3x 12 bilat; some difficulty with DF at end of reps - SLS LLE with 1x 2 finger support 2x 30sec; multiple attempts without UE support with difficulty - Tandem stance without UE support x30sec each foot forward - SLS LLE on foam with 1x 79finger support 2x 30sec - DF from small incline 3x 15 with difficulty with eccentric control  -Biodex limits of stability Trial 1:Hardlevel41% in72min3sec; Trial2:Hardlevel48%in 49sec;  Trial3:Hardlevel52% in46sec - Tandem and DF walking 2x 73ftFWD 56ft BKWD CGA for safety, but patient able to re-establish balance independently  -Nustep L58mins with pulling into DF, above 50spm fairly consistently   Gait Training 1.27mph 48mins; 1.84mph on4% incline64minsmins;1.8mph with6% incline;1.32mph on8% incline41minsminscuing throughout for heel strike, utilizing sounds of the treadmill to increase this and even step length, no instances of toe drag                        PT Education - 12/21/19 1436    Education Details  therex form, gait training    Person(s) Educated  Patient    Methods  Explanation;Demonstration;Verbal cues    Comprehension  Verbalized understanding;Returned demonstration;Verbal cues required       PT Short Term Goals - 11/11/19 1039      PT SHORT TERM GOAL #1   Title  Pt will be independent with HEP in order to improve strength and decrease back pain in order to improve pain-free function at home and work.    Baseline  11/11/19 HEP given    Time  4    Period  Weeks    Status  New        PT Long Term Goals - 11/11/19 1039      PT LONG TERM GOAL #1   Title  Pt will decrease 5TSTS by at least 3 seconds in order to demonstrate clinically significant improvement in LE strength    Baseline  11/11/19 17sec    Time  8    Period  Weeks    Status  New      PT LONG TERM GOAL #2   Title  Pt will increase 10MWT by at least 1.72m/s in order to demonstrate clinically normal community ambulation to decrease fall risk    Baseline  11/11/19 1.53m/s    Time  8    Period  Weeks    Status  New      PT LONG TERM GOAL #3   Title  Patient will demonstrate 10d of active L ankle DF in order to demonstrate mobility needed for foot clearance with ambulation to prevent fall risk    Baseline  11/11/19 -8 degrees    Time  8    Period  Weeks    Status  New      PT LONG TERM GOAL #4   Title  Patient will demonstrate FGA score of 22 or more  in order to demonstrate decreased fall risk with dynamic balance    Baseline  11/11/19 20/30    Time  8    Period  Weeks    Status  New  PT LONG TERM GOAL #5   Title  Patient will demonstrate SLS of 10sec or more in order to demonstrate age-matched norm for static balance    Baseline  11/11/19 unable bilat    Time  8    Period  Weeks    Status  New      Additional Long Term Goals   Additional Long Term Goals  Yes            Plan - 12/21/19 1440    Clinical Impression Statement  PT continued therex progression for proprioception and DF concentric and eccentric strength with good success. Patient is able to complete all therex with proper technique following some cuing and encouragement. PT will reassess goals next visit to determine discharge readniss and continue progression as able.    Personal Factors and Comorbidities  Age;Comorbidity 1;Comorbidity 2;Comorbidity 3+;Time since onset of injury/illness/exacerbation    Comorbidities  arthritis, arrthymia, hx of cancer    Examination-Participation Restrictions  Laundry;Cleaning;Community Activity    Stability/Clinical Decision Making  Evolving/Moderate complexity    Clinical Decision Making  Moderate    Rehab Potential  Fair    PT Frequency  2x / week    PT Duration  8 weeks    PT Treatment/Interventions  ADLs/Self Care Home Management;Electrical Stimulation;Therapeutic activities;Passive range of motion;Manual techniques;Patient/family education;Therapeutic exercise;DME Instruction;Iontophoresis 4mg /ml Dexamethasone;Gait training;Balance training;Dry needling;Neuromuscular re-education;Stair training;Functional mobility training;Ultrasound;Cryotherapy;Traction;Moist Heat;Aquatic Therapy;Spinal Manipulations    PT Next Visit Plan  hip ext strength, DF mobility, gait training    PT Home Exercise Plan  [id number]    Consulted and Agree with Plan of Care  Patient       Patient will benefit from skilled therapeutic intervention in  order to improve the following deficits and impairments:  Decreased activity tolerance, Decreased coordination, Decreased strength, Increased fascial restricitons, Impaired flexibility, Postural dysfunction, Pain, Improper body mechanics, Decreased range of motion, Abnormal gait, Decreased balance, Decreased endurance, Decreased mobility, Difficulty walking  Visit Diagnosis: Foot drop, left  Stiffness of left foot, not elsewhere classified  Difficulty in walking, not elsewhere classified     Problem List Patient Active Problem List   Diagnosis Date Noted  . Synovial cyst of lumbar facet joint 08/14/2019   Juan [name] PT, DPT Juan [name] 12/21/2019, 3:08 PM  [facility] [address] [place], [address] Phone: [phone]   Fax:  [phone]  Name: Juan [name] MRN: [id number] Date of Birth: December 09, 1940

## 2019-12-23 ENCOUNTER — Other Ambulatory Visit: Payer: Self-pay

## 2019-12-23 ENCOUNTER — Encounter: Payer: Self-pay | Admitting: Physical Therapy

## 2019-12-23 ENCOUNTER — Ambulatory Visit: Payer: PPO | Admitting: Physical Therapy

## 2019-12-23 DIAGNOSIS — M21372 Foot drop, left foot: Secondary | ICD-10-CM

## 2019-12-23 DIAGNOSIS — M25675 Stiffness of left foot, not elsewhere classified: Secondary | ICD-10-CM

## 2019-12-23 DIAGNOSIS — R262 Difficulty in walking, not elsewhere classified: Secondary | ICD-10-CM

## 2019-12-23 NOTE — Therapy (Signed)
[facility] [address] [place], [address] Phone: [phone]   Fax:  [phone]  Physical Therapy Treatment/Discharge Summary Reporting Period 11/11/19 - 12/23/19   Patient Details  Name: Juan [name] MRN: [id number] Date of Birth: 1940/10/29 No data recorded  Encounter Date: 12/23/2019  PT End of Session - 12/23/19 1036    Visit Number  12    Number of Visits  17    Date for PT Re-Evaluation  01/06/20    PT Start Time  1030    PT Stop Time  1108    PT Time Calculation (min)  38 min    Equipment Utilized During Treatment  Gait belt    Activity Tolerance  Patient tolerated treatment well    Behavior During Therapy  [facility] for tasks assessed/performed       Past Medical History:  Diagnosis Date  . Arthritis    back- low  . Atrial fibrillation ([place])   . Cancer (HCC)    skin ( basal) cell , face, head, back, inside L ear  . Coronary artery disease   . Dysrhythmia   . MI (myocardial infarction) ([place])   . Sleep apnea    +Cpap in use nightly , last study- 10 yrs. ago  . 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 APPENDECTOMY    . BACK SURGERY  09/2011   lumbar fusion   . CARDIAC CATHETERIZATION    . CARDIAC SURGERY    . CARDIAC VALVE REPLACEMENT    . CORONARY ARTERY BYPASS GRAFT    . EYE SURGERY Bilateral    catarascts removed-   . HERNIA REPAIR Bilateral 1946, 1990's   inguinal x3 procedures   . LUMBAR LAMINECTOMY/DECOMPRESSION MICRODISCECTOMY Left 08/14/2019   Procedure: Laminectomy for facet/synovial cyst - left - Lumbar three-Lumbar four;  Surgeon: Earnie Larsson, MD;  Location: [facility];  Service: Neurosurgery;  Laterality: Left;  . TONSILLECTOMY      There were no vitals filed for this visit.  Subjective Assessment - 12/23/19 1033    Subjective  Pt reports he walked around the outlets with his wife yesterday and completed yard work without  issue. Did have some cramping at his calf that resolves with stretching. Does report some ant tib tension as well. He is ready to d/c PT to HEP.    Pertinent History  Pt is a 79 year old male S/P L3-4 laminectomy 08/14/19. Prior to surgery had severe left LE pain and paresthsia that failed conservative intervention. Aim of surgery was to decrease symptoms through resection of synovial cyst on left of L3-4 with decompression. History of L4-5 decompression and fusion for similar symptoms in 2013. Patient reports since this surgery his pain is allieviated which he is happy with. Patient reports his numbess/tingling is gone as well, but that his foot is dropping when he walks and notices he cannot pull his toes up in sitting. He does report that he feels unsteady on his feet and has had some instances where he catches his balance and is falling forward. He thinks he has lost some LE strength as well. 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  Pt denies N/V, B&B changes, unexplained weight  fluctuation, saddle paresthesia, fever, night sweats, or unrelenting night pain at this time.    How long can you sit comfortably?  unlimited    How long can you stand comfortably?  unlimited    How long can you walk comfortably?  16mns    Diagnostic tests  None since surgery    Patient Stated Goals  Walk normally with toe raise       Ther-Ex -5xSTS trial 1: 13.5sec trial 2: 12.1sec - Reverse lunge to hip flex with DF 3x 12 bilat; some difficulty with DF at end of reps - SLS 12sec bilat without UE support  Access Code: [email or web address]: Patient able to complete a set of the following therex with minimal cuing needed for technique, good corrections made. PT educated patient on frequency, rep/set range, how and when to increase/decrease  intensity and varied parameters for strengthening vs. Stretching regimen. Verbalized understanding and print out given  Standing Gastroc Stretch at Counter - 3 x daily - 7 x weekly - 1263m hold  Seated Anterior Tibialis Stretch - 3 x daily - 7 x weekly - 63m66mhold  Seated Toe Raise - 3 x daily - 7 x weekly - 15 reps  Single Leg Stance - 1 x daily - 7 x weekly - 30sec hold  Reverse Lunge - 1 x daily - 3 x weekly - 3 sets - 12 reps  Heel Walking with Counter Support - 1 x daily - 3 x weekly - 3 sets -Nustep L74m63mwith pulling into DF, above 60spm fairly consistently  Gait Training 1.5mph75mins36m.5mph o40m incline56minsmi66m1.8mph with6% incline;1.5mph on849mncline56minsmins26mng throughout for heel strike, utilizing sounds of the treadmill to increase this and even step length, no instances of toe drag FGA with supervision for safety                       PT Education - 12/23/19 1036    Education Details  HEP recommendations    Person(s) Educated  Patient    Methods  Explanation;Demonstration;Verbal cues    Comprehension  Verbalized understanding;Returned demonstration;Verbal cues required       PT Short Term Goals - 11/11/19 1039      PT SHORT TERM GOAL #1   Title  Pt will be independent with HEP in order to improve strength and decrease back pain in order to improve pain-free function at home and work.    Baseline  11/11/19 HEP given    Time  4    Period  Weeks    Status  New        PT Long Term Goals - 12/23/19 1038      PT LONG TERM GOAL #1   Title  Pt will decrease 5TSTS by at least 3 seconds in order to demonstrate clinically significant improvement in LE strength    Baseline  11/11/19 17sec 12/23/19 12.1sec    Time  8    Period  Weeks    Status  Achieved      PT LONG TERM GOAL #2   Title  Pt will increase 10MWT by at least 1.56m/s in or263m to demonstrate clinically normal community ambulation to decrease fall risk    Baseline  11/11/19  1.67m/s 3/31/48m.66m/s    Tim40m    Period  Weeks    Status  Achieved      PT LONG TERM GOAL #3   Title  Patient will demonstrate 10d of active L ankle DF in  order to demonstrate mobility needed for foot clearance with ambulation to prevent fall risk    Baseline  11/11/19 -8 degrees; 12/23/19 5degrees    Time  8    Period  Weeks    Status  Partially Met      PT LONG TERM GOAL #4   Title  Patient will demonstrate FGA score of 22 or more in order to demonstrate decreased fall risk with dynamic balance    Baseline  11/11/19 20/30 12/23/19 27/30    Time  8    Period  Weeks    Status  Achieved      PT LONG TERM GOAL #5   Title  Patient will demonstrate SLS of 10sec or more in order to demonstrate age-matched norm for static balance    Baseline  11/11/19 unable bilat; 12/23/19 30sec bilat    Time  8    Period  Weeks    Status  Achieved            Plan - 12/23/19 1115    Clinical Impression Statement  Patient goals reassessed this session where patient has met all goals other than active DF ROM. PT educated patient on HEP for maintainence of PT gains and continuing to increase active DF edurance. Patient is able to demonstrate proper technique of therex and verbalizes understanding of all recommendations. Pt given clinic contact info should further questions/concerns arise.    Personal Factors and Comorbidities  Age;Comorbidity 1;Comorbidity 2;Comorbidity 3+;Time since onset of injury/illness/exacerbation    Comorbidities  arthritis, arrthymia, hx of cancer    Examination-Activity Limitations  Squat;Stairs;Carry;Transfers;Bend    Examination-Participation Restrictions  Laundry;Cleaning;Community Activity    Stability/Clinical Decision Making  Evolving/Moderate complexity    Clinical Decision Making  Moderate    Rehab Potential  Fair    PT Frequency  2x / week    PT Duration  8 weeks    PT Treatment/Interventions  ADLs/Self Care Home Management;Electrical Stimulation;Therapeutic  activities;Passive range of motion;Manual techniques;Patient/family education;Therapeutic exercise;DME Instruction;Iontophoresis 73m/ml Dexamethasone;Gait training;Balance training;Dry needling;Neuromuscular re-education;Stair training;Functional mobility training;Ultrasound;Cryotherapy;Traction;Moist Heat;Aquatic Therapy;Spinal Manipulations    PT Next Visit Plan  d/c    PT Home Exercise Plan  See 12/23/19 treatment note    Consulted and Agree with Plan of Care  Patient       Patient will benefit from skilled therapeutic intervention in order to improve the following deficits and impairments:  Decreased activity tolerance, Decreased coordination, Decreased strength, Increased fascial restricitons, Impaired flexibility, Postural dysfunction, Pain, Improper body mechanics, Decreased range of motion, Abnormal gait, Decreased balance, Decreased endurance, Decreased mobility, Difficulty walking  Visit Diagnosis: Foot drop, left  Stiffness of left foot, not elsewhere classified  Difficulty in walking, not elsewhere classified     Problem List Patient Active Problem List   Diagnosis Date Noted  . Synovial cyst of lumbar facet joint 08/14/2019   CShelton SilvasPT, DPT CShelton Silvas3/31/2021, 11:22 AM  [facility] [address] [place] [address]Phone: [phone]  Fax:  [phone] Name: Juan ElsayedMRN: [id number] of Birth: 4Aug 28, 1942

## 2019-12-31 DIAGNOSIS — M7138 Other bursal cyst, other site: Secondary | ICD-10-CM | POA: Diagnosis not present

## 2020-01-04 DIAGNOSIS — Z9989 Dependence on other enabling machines and devices: Secondary | ICD-10-CM | POA: Diagnosis not present

## 2020-01-04 DIAGNOSIS — E782 Mixed hyperlipidemia: Secondary | ICD-10-CM | POA: Diagnosis not present

## 2020-01-04 DIAGNOSIS — Z1211 Encounter for screening for malignant neoplasm of colon: Secondary | ICD-10-CM | POA: Diagnosis not present

## 2020-01-04 DIAGNOSIS — Z79899 Other long term (current) drug therapy: Secondary | ICD-10-CM | POA: Diagnosis not present

## 2020-01-04 DIAGNOSIS — G4733 Obstructive sleep apnea (adult) (pediatric): Secondary | ICD-10-CM | POA: Diagnosis not present

## 2020-01-04 DIAGNOSIS — I2510 Atherosclerotic heart disease of native coronary artery without angina pectoris: Secondary | ICD-10-CM | POA: Diagnosis not present

## 2020-01-25 DIAGNOSIS — Z1211 Encounter for screening for malignant neoplasm of colon: Secondary | ICD-10-CM | POA: Diagnosis not present

## 2020-01-29 DIAGNOSIS — G4733 Obstructive sleep apnea (adult) (pediatric): Secondary | ICD-10-CM | POA: Diagnosis not present

## 2020-03-02 DIAGNOSIS — E782 Mixed hyperlipidemia: Secondary | ICD-10-CM | POA: Diagnosis not present

## 2020-03-02 DIAGNOSIS — I4891 Unspecified atrial fibrillation: Secondary | ICD-10-CM | POA: Diagnosis not present

## 2020-03-02 DIAGNOSIS — I2510 Atherosclerotic heart disease of native coronary artery without angina pectoris: Secondary | ICD-10-CM | POA: Diagnosis not present

## 2020-03-02 DIAGNOSIS — I059 Rheumatic mitral valve disease, unspecified: Secondary | ICD-10-CM | POA: Diagnosis not present

## 2020-03-02 DIAGNOSIS — Z87898 Personal history of other specified conditions: Secondary | ICD-10-CM | POA: Diagnosis not present

## 2020-03-02 DIAGNOSIS — Z951 Presence of aortocoronary bypass graft: Secondary | ICD-10-CM | POA: Diagnosis not present

## 2020-03-02 DIAGNOSIS — Z9889 Other specified postprocedural states: Secondary | ICD-10-CM | POA: Diagnosis not present

## 2020-04-05 DIAGNOSIS — E559 Vitamin D deficiency, unspecified: Secondary | ICD-10-CM | POA: Diagnosis not present

## 2020-04-05 DIAGNOSIS — E782 Mixed hyperlipidemia: Secondary | ICD-10-CM | POA: Diagnosis not present

## 2020-04-05 DIAGNOSIS — G4733 Obstructive sleep apnea (adult) (pediatric): Secondary | ICD-10-CM | POA: Diagnosis not present

## 2020-04-05 DIAGNOSIS — Z79899 Other long term (current) drug therapy: Secondary | ICD-10-CM | POA: Diagnosis not present

## 2020-04-05 DIAGNOSIS — G720 Drug-induced myopathy: Secondary | ICD-10-CM | POA: Diagnosis not present

## 2020-04-05 DIAGNOSIS — I2510 Atherosclerotic heart disease of native coronary artery without angina pectoris: Secondary | ICD-10-CM | POA: Diagnosis not present

## 2020-04-05 DIAGNOSIS — T466X5A Adverse effect of antihyperlipidemic and antiarteriosclerotic drugs, initial encounter: Secondary | ICD-10-CM | POA: Diagnosis not present

## 2020-04-05 DIAGNOSIS — Z9989 Dependence on other enabling machines and devices: Secondary | ICD-10-CM | POA: Diagnosis not present

## 2020-04-13 DIAGNOSIS — I2510 Atherosclerotic heart disease of native coronary artery without angina pectoris: Secondary | ICD-10-CM | POA: Diagnosis not present

## 2020-04-13 DIAGNOSIS — I059 Rheumatic mitral valve disease, unspecified: Secondary | ICD-10-CM | POA: Diagnosis not present

## 2020-05-11 DIAGNOSIS — L820 Inflamed seborrheic keratosis: Secondary | ICD-10-CM | POA: Diagnosis not present

## 2020-05-11 DIAGNOSIS — L02222 Furuncle of back [any part, except buttock]: Secondary | ICD-10-CM | POA: Diagnosis not present

## 2020-05-11 DIAGNOSIS — L570 Actinic keratosis: Secondary | ICD-10-CM | POA: Diagnosis not present

## 2020-05-11 DIAGNOSIS — D2272 Melanocytic nevi of left lower limb, including hip: Secondary | ICD-10-CM | POA: Diagnosis not present

## 2020-05-11 DIAGNOSIS — D225 Melanocytic nevi of trunk: Secondary | ICD-10-CM | POA: Diagnosis not present

## 2020-05-11 DIAGNOSIS — L538 Other specified erythematous conditions: Secondary | ICD-10-CM | POA: Diagnosis not present

## 2020-05-11 DIAGNOSIS — D2262 Melanocytic nevi of left upper limb, including shoulder: Secondary | ICD-10-CM | POA: Diagnosis not present

## 2020-05-11 DIAGNOSIS — D2261 Melanocytic nevi of right upper limb, including shoulder: Secondary | ICD-10-CM | POA: Diagnosis not present

## 2020-05-11 DIAGNOSIS — D2271 Melanocytic nevi of right lower limb, including hip: Secondary | ICD-10-CM | POA: Diagnosis not present

## 2020-05-11 DIAGNOSIS — X32XXXA Exposure to sunlight, initial encounter: Secondary | ICD-10-CM | POA: Diagnosis not present

## 2020-05-11 DIAGNOSIS — Z85828 Personal history of other malignant neoplasm of skin: Secondary | ICD-10-CM | POA: Diagnosis not present

## 2020-06-22 DIAGNOSIS — H6123 Impacted cerumen, bilateral: Secondary | ICD-10-CM | POA: Diagnosis not present

## 2020-06-22 DIAGNOSIS — H903 Sensorineural hearing loss, bilateral: Secondary | ICD-10-CM | POA: Diagnosis not present

## 2020-07-07 DIAGNOSIS — Z0000 Encounter for general adult medical examination without abnormal findings: Secondary | ICD-10-CM | POA: Diagnosis not present

## 2020-07-07 DIAGNOSIS — T466X5A Adverse effect of antihyperlipidemic and antiarteriosclerotic drugs, initial encounter: Secondary | ICD-10-CM | POA: Diagnosis not present

## 2020-07-07 DIAGNOSIS — Z79899 Other long term (current) drug therapy: Secondary | ICD-10-CM | POA: Diagnosis not present

## 2020-07-07 DIAGNOSIS — I4891 Unspecified atrial fibrillation: Secondary | ICD-10-CM | POA: Diagnosis not present

## 2020-07-07 DIAGNOSIS — Z23 Encounter for immunization: Secondary | ICD-10-CM | POA: Diagnosis not present

## 2020-07-07 DIAGNOSIS — E782 Mixed hyperlipidemia: Secondary | ICD-10-CM | POA: Diagnosis not present

## 2020-07-07 DIAGNOSIS — G720 Drug-induced myopathy: Secondary | ICD-10-CM | POA: Diagnosis not present

## 2020-07-07 DIAGNOSIS — E559 Vitamin D deficiency, unspecified: Secondary | ICD-10-CM | POA: Diagnosis not present

## 2020-09-02 DIAGNOSIS — Y92009 Unspecified place in unspecified non-institutional (private) residence as the place of occurrence of the external cause: Secondary | ICD-10-CM | POA: Diagnosis not present

## 2020-09-02 DIAGNOSIS — W11XXXA Fall on and from ladder, initial encounter: Secondary | ICD-10-CM | POA: Diagnosis not present

## 2020-09-02 DIAGNOSIS — I517 Cardiomegaly: Secondary | ICD-10-CM | POA: Diagnosis not present

## 2020-09-02 DIAGNOSIS — R0781 Pleurodynia: Secondary | ICD-10-CM | POA: Diagnosis not present

## 2020-09-08 DIAGNOSIS — Z9889 Other specified postprocedural states: Secondary | ICD-10-CM | POA: Diagnosis not present

## 2020-09-08 DIAGNOSIS — I2510 Atherosclerotic heart disease of native coronary artery without angina pectoris: Secondary | ICD-10-CM | POA: Diagnosis not present

## 2020-09-08 DIAGNOSIS — Z9989 Dependence on other enabling machines and devices: Secondary | ICD-10-CM | POA: Diagnosis not present

## 2020-09-08 DIAGNOSIS — I4891 Unspecified atrial fibrillation: Secondary | ICD-10-CM | POA: Diagnosis not present

## 2020-09-08 DIAGNOSIS — Z951 Presence of aortocoronary bypass graft: Secondary | ICD-10-CM | POA: Diagnosis not present

## 2020-09-08 DIAGNOSIS — E782 Mixed hyperlipidemia: Secondary | ICD-10-CM | POA: Diagnosis not present

## 2020-09-08 DIAGNOSIS — I059 Rheumatic mitral valve disease, unspecified: Secondary | ICD-10-CM | POA: Diagnosis not present

## 2020-09-08 DIAGNOSIS — G4733 Obstructive sleep apnea (adult) (pediatric): Secondary | ICD-10-CM | POA: Diagnosis not present

## 2020-10-20 DIAGNOSIS — Z125 Encounter for screening for malignant neoplasm of prostate: Secondary | ICD-10-CM | POA: Diagnosis not present

## 2020-10-20 DIAGNOSIS — Z79899 Other long term (current) drug therapy: Secondary | ICD-10-CM | POA: Diagnosis not present

## 2020-10-20 DIAGNOSIS — R9082 White matter disease, unspecified: Secondary | ICD-10-CM | POA: Diagnosis not present

## 2020-10-20 DIAGNOSIS — T466X5A Adverse effect of antihyperlipidemic and antiarteriosclerotic drugs, initial encounter: Secondary | ICD-10-CM | POA: Diagnosis not present

## 2020-10-20 DIAGNOSIS — R7309 Other abnormal glucose: Secondary | ICD-10-CM | POA: Diagnosis not present

## 2020-10-20 DIAGNOSIS — E559 Vitamin D deficiency, unspecified: Secondary | ICD-10-CM | POA: Diagnosis not present

## 2020-10-20 DIAGNOSIS — E782 Mixed hyperlipidemia: Secondary | ICD-10-CM | POA: Diagnosis not present

## 2020-10-20 DIAGNOSIS — I2510 Atherosclerotic heart disease of native coronary artery without angina pectoris: Secondary | ICD-10-CM | POA: Diagnosis not present

## 2020-10-20 DIAGNOSIS — G720 Drug-induced myopathy: Secondary | ICD-10-CM | POA: Diagnosis not present

## 2020-10-27 DIAGNOSIS — G3184 Mild cognitive impairment, so stated: Secondary | ICD-10-CM | POA: Diagnosis not present

## 2020-10-27 DIAGNOSIS — M21372 Foot drop, left foot: Secondary | ICD-10-CM | POA: Diagnosis not present

## 2020-10-27 DIAGNOSIS — I480 Paroxysmal atrial fibrillation: Secondary | ICD-10-CM | POA: Diagnosis not present

## 2020-10-27 DIAGNOSIS — Z8673 Personal history of transient ischemic attack (TIA), and cerebral infarction without residual deficits: Secondary | ICD-10-CM | POA: Diagnosis not present

## 2020-11-16 ENCOUNTER — Other Ambulatory Visit: Payer: Self-pay

## 2020-11-16 ENCOUNTER — Ambulatory Visit: Payer: PPO | Attending: Neurology

## 2020-11-16 DIAGNOSIS — G8929 Other chronic pain: Secondary | ICD-10-CM | POA: Insufficient documentation

## 2020-11-16 DIAGNOSIS — M25675 Stiffness of left foot, not elsewhere classified: Secondary | ICD-10-CM | POA: Diagnosis not present

## 2020-11-16 DIAGNOSIS — M21372 Foot drop, left foot: Secondary | ICD-10-CM | POA: Insufficient documentation

## 2020-11-16 DIAGNOSIS — M545 Low back pain, unspecified: Secondary | ICD-10-CM | POA: Diagnosis not present

## 2020-11-16 DIAGNOSIS — R262 Difficulty in walking, not elsewhere classified: Secondary | ICD-10-CM | POA: Insufficient documentation

## 2020-11-16 NOTE — Patient Instructions (Signed)
Access Code: [email or web address] URL: [email or web address] Date: 11/16/2020 Prepared by: Joneen Boers  Exercises Seated Hip Internal Rotation AROM - 1 x daily - 7 x weekly - 3 sets - 10 reps

## 2020-11-16 NOTE — Therapy (Signed)
[facility] [address], [place], [address] Phone: [phone]   Fax:  [phone]  Physical Therapy Evaluation  Patient Details  Name: Juan [name] MRN: [id number] Date of Birth: April 02, 1941 Referring Provider (PT): Donn Pierini, MD   Encounter Date: 11/16/2020   PT End of Session - 11/16/20 0948    Visit Number 1    Number of Visits 25    Date for PT Re-Evaluation 02/09/21    Authorization Type 1    Authorization Time Period of 10    PT Start Time 0948    PT Stop Time 1042    PT Time Calculation (min) 54 min    Activity Tolerance Patient tolerated treatment well    Behavior During Therapy [facility] for tasks assessed/performed           Past Medical History:  Diagnosis Date  . Arthritis    back- low  . Atrial fibrillation ([place])   . Cancer (HCC)    skin ( basal) cell , face, head, back, inside L ear  . Coronary artery disease   . Dysrhythmia   . MI (myocardial infarction) ([place])   . Sleep apnea    +Cpap in use nightly , last study- 10 yrs. ago  . Stroke [facility])    "light stroke"    Past Surgical History:  Procedure Laterality Date  . APPENDECTOMY    . BACK SURGERY  09/2011   lumbar fusion   . CARDIAC CATHETERIZATION    . CARDIAC SURGERY    . CARDIAC VALVE REPLACEMENT    . CORONARY ARTERY BYPASS GRAFT    . EYE SURGERY Bilateral    catarascts removed-   . HERNIA REPAIR Bilateral 1946, 1990's   inguinal x3 procedures   . LUMBAR LAMINECTOMY/DECOMPRESSION MICRODISCECTOMY Left 08/14/2019   Procedure: Laminectomy for facet/synovial cyst - left - Lumbar three-Lumbar four;  Surgeon: Earnie Larsson, MD;  Location: [facility];  Service: Neurosurgery;  Laterality: Left;  . TONSILLECTOMY      There were no vitals filed for this visit.    Subjective Assessment - 11/16/20 0951    Subjective Low back: 5/10 currently. 8/10 at most for the past month.    Pertinent History L foot drop. Pt had lumbar  fusion November 2020 which alleviated low back pain. Pt however had L foot drop afterwards. Had PT but still has his foot drop. Pt has trouble walking and wobbles and cannot seem to walk in a straight line. L leg feels weaker than his R. Denies loss of bowel or bladder control. Had numbness R great toe (L4 dermatome) since the surgery. No L LE numbness. Denies saddle anesthesia. Currently has L low back pain which developed recently about 2 months, gradual onset. Pt walks around the mall 4-5x a week.    Patient Stated Goals Walk more stable and straight.    Currently in Pain? Yes    Pain Score 5     Pain Location Back    Pain Orientation Lower    Pain Descriptors / Indicators Aching    Pain Type Acute pain    Aggravating Factors  standing up from sitting, bending over    Pain Relieving Factors Walking, laying on his back or R or L sides.  Darnelle Catalan is difficult for his L foot.              [facility] PT Assessment - 11/16/20 0959      Assessment   Medical  Diagnosis L foot drop    Referring Provider (PT) Donn Pierini, MD    Onset Date/Surgical Date 11/10/20    Prior Therapy Yes, 2021      Precautions   Precaution Comments No known precautions      Restrictions   Other Position/Activity Restrictions No known restrictions      Balance Screen   Has the patient fallen in the past 6 months No    Has the patient had a decrease in activity level because of a fear of falling?  No    Is the patient reluctant to leave their home because of a fear of falling?  No      Home Environment   Additional Comments Pt lives in a 1 story home with wife. 3 steps to enter back door L bar assist. 3-4 steps front door, no rails.      Prior Function   Vocation Retired      Observation/Other Assessments   Focus on Therapeutic Outcomes (FOTO)  Leg FOTO 69      Posture/Postural Control   Posture Comments forward neck, R lateral shift, R greater trochanter higher, R hip in ER      AROM   Left Ankle  Dorsiflexion -17   0 degrees AAROM, tight end feel (gastroc muscle)   Lumbar Flexion limited with L L5 dermatome pain to his L knee. Eases with rest.    Lumbar Extension limited    Lumbar - Right Side Bend limited    Lumbar - Left Side Bend limited with slight L low back pain    Lumbar - Right Rotation WFL    Lumbar - Left Rotation WFL      PROM   Right Hip Internal Rotation  34    Left Hip Internal Rotation  25      Strength   Right Hip Flexion 4-/5    Right Hip Extension 4-/5   seated manually resisted   Right Hip ABduction 4/5    Left Hip Flexion 4-/5    Left Hip Extension 4-/5   seated manually resisted; with L low back pain   Left Hip ABduction 4/5    Right Knee Flexion 5/5    Right Knee Extension 5/5    Left Knee Flexion 4/5    Left Knee Extension 5/5    Right Ankle Dorsiflexion 4/5    Left Ankle Dorsiflexion 3-/5      Special Tests   Other special tests (+) Long sit test suggesting anterior nutation or L innominate.      Ambulation/Gait   Gait Comments Gait: decreased L heel strike. L foot drop.                      Objective measurements completed on examination: See above findings.  Lumbar fusiont L4-L5 with interbody spacer. Has L3 posterior elements      No Blood pressure problems per pt.  No latex allergies  (-) L calf squeeze   L foot drop   Gait: decreased L heel strike. L foot drop.    Muscle tension B lumbar paraspinal muscles.      Medbridge Access Code [email or web address]   Therapeutic exercise  Supine hip piriformis stretch 1 min x 3   supine with L hip at 90/90, hip IR PROM 10x3  Seated L hip IR 10x3  Reviewed and given as part of his HEP. Pt demonstrated and verbalized understanding. Handout provided.   Improved exercise technique, movement at target joints,  use of target muscles after mod verbal, visual, tactile cues.    Response to treatment Decreased L low back pain to 0/10 afterwards  Clinical impression Pt is an 80  year old male who came to physical therapy secondary to L foot drop. He also presents with decreased heel strike during gait, bilateral hip weakness, limited L ankle DF ROM and strength, positive special test suggesting lumbopelvic involvement for low back pain, altered posture, limited L hip IR ROM, and difficulty ambulating, as well as bending over and standing up from sitting. Pt will benefit from skilled physical therapy services to address the aforementioned deficits.           PT Education - 11/16/20 1323    Education Details ther-ex, HEP    Person(s) Educated Patient    Methods Explanation;Demonstration;Tactile cues;Verbal cues;Handout    Comprehension Returned demonstration;Verbalized understanding            PT Short Term Goals - 11/16/20 1101      PT SHORT TERM GOAL #1   Title Pt will be independent with his initial HEP to improve L ankle strength and AROM as well as decrease low back pain.    Baseline Pt has started his HEP. (11/16/2020)    Time 3    Period Weeks    Status New    Target Date 12/08/20             PT Long Term Goals - 11/16/20 1102      PT LONG TERM GOAL #1   Title Patient will improve L ankle AROM to 5 degrees or more to promote foot clearance during gait.    Baseline -17 degrees L ankle DF AROM (11/16/2020)    Time 12    Period Weeks    Status New    Target Date 02/09/21      PT LONG TERM GOAL #2   Title Pt will improve bilateral hip extension and abduction strength by at least 1/2 MMT grade to promote ability to perform tasks with less back pain.    Baseline Hip extension 4-/5 R and L, hip abduction 4/5 R and L (11/16/2020)    Time 12    Period Weeks    Status New    Target Date 02/09/21      PT LONG TERM GOAL #3   Title Pt will improve his leg FOTO score by at least 10 points as a demonstration of improved function.    Baseline Leg FOTO 69 (11/16/2020)    Time 12    Period Weeks    Status New    Target Date 02/09/21      PT LONG  TERM GOAL #4   Title Pt will improve L ankle DF strength by at least 1/2 MMT grade to promote ability to ambulate with less difficulty.    Baseline L ankle DF 3-/5 (11/16/2020)    Time 12    Period Weeks    Status New    Target Date 02/09/21                  Plan - 11/16/20 1055    Clinical Impression Statement Pt is an 80 year old male who came to physical therapy secondary to L foot drop. He also presents with decreased heel strike during gait, bilateral hip weakness, limited L ankle DF ROM and strength, positive special test suggesting lumbopelvic involvement for low back pain, altered posture, limited L hip IR ROM, and difficulty ambulating, as well  as bending over and standing up from sitting. Pt will benefit from skilled physical therapy services to address the aforementioned deficits.    Personal Factors and Comorbidities Age;Comorbidity 3+;Past/Current Experience;Time since onset of injury/illness/exacerbation    Comorbidities CA, arthritis, CAD, MI, CVA, low back surgery    Examination-Activity Limitations Dressing;Transfers;Bend;Lift;Locomotion Level    Stability/Clinical Decision Making Stable/Uncomplicated    Clinical Decision Making Low    Rehab Potential Fair    PT Frequency 2x / week    PT Duration 12 weeks    PT Treatment/Interventions Therapeutic activities;Therapeutic exercise;Functional mobility training;Balance training;Neuromuscular re-education;Patient/family education;Manual techniques;Dry needling;Aquatic Therapy;Electrical Stimulation;Iontophoresis 4mg /ml Dexamethasone    PT Next Visit Plan Hip ROM, hip, trunk, and ankle strengthening, manual techniques, modalities PRN    PT Home Exercise Plan Medbridge Access Code [email or web address]    Consulted and Agree with Plan of Care Patient           Patient will benefit from skilled therapeutic intervention in order to improve the following deficits and impairments:  Pain,Postural dysfunction,Improper body  mechanics,Difficulty walking,Decreased strength,Decreased range of motion  Visit Diagnosis: Chronic left-sided low back pain, unspecified whether sciatica present - Plan: PT plan of care cert/re-cert  Foot drop, left - Plan: PT plan of care cert/re-cert  Difficulty in walking, not elsewhere classified - Plan: PT plan of care cert/re-cert     Problem List Patient Active Problem List   Diagnosis Date Noted  . Synovial cyst of lumbar facet joint 08/14/2019    Joneen Boers PT, DPT   11/16/2020, 1:52 PM  [facility] [address] [place], [address] Phone: [phone]   Fax:  [phone]  Name: Sanel Stemmer MRN: [id number] Date of Birth: 01/18/1941

## 2020-11-17 DIAGNOSIS — G4733 Obstructive sleep apnea (adult) (pediatric): Secondary | ICD-10-CM | POA: Diagnosis not present

## 2020-11-21 ENCOUNTER — Other Ambulatory Visit: Payer: Self-pay

## 2020-11-21 ENCOUNTER — Ambulatory Visit: Payer: PPO

## 2020-11-21 DIAGNOSIS — M25675 Stiffness of left foot, not elsewhere classified: Secondary | ICD-10-CM

## 2020-11-21 DIAGNOSIS — M545 Low back pain, unspecified: Secondary | ICD-10-CM | POA: Diagnosis not present

## 2020-11-21 DIAGNOSIS — R262 Difficulty in walking, not elsewhere classified: Secondary | ICD-10-CM

## 2020-11-21 DIAGNOSIS — M21372 Foot drop, left foot: Secondary | ICD-10-CM

## 2020-11-21 NOTE — Therapy (Signed)
[facility] [address], [place], [address] Phone: [phone]   Fax:  [phone]  Physical Therapy Treatment  Patient Details  Name: Juan [name] MRN: [id number] Date of Birth: 08/19/1941 Referring Provider (PT): Donn Pierini, MD   Encounter Date: 11/21/2020   PT End of Session - 11/21/20 0903    Visit Number 2    Number of Visits 25    Date for PT Re-Evaluation 02/09/21    Authorization Type 2    Authorization Time Period of 10    PT Start Time 0904    PT Stop Time 0945    PT Time Calculation (min) 41 min    Activity Tolerance Patient tolerated treatment well    Behavior During Therapy [facility] for tasks assessed/performed           Past Medical History:  Diagnosis Date  . Arthritis    back- low  . Atrial fibrillation ([place])   . Cancer (HCC)    skin ( basal) cell , face, head, back, inside L ear  . Coronary artery disease   . Dysrhythmia   . MI (myocardial infarction) ([place])   . Sleep apnea    +Cpap in use nightly , last study- 10 yrs. ago  . 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 APPENDECTOMY    . BACK SURGERY  09/2011   lumbar fusion   . CARDIAC CATHETERIZATION    . CARDIAC SURGERY    . CARDIAC VALVE REPLACEMENT    . CORONARY ARTERY BYPASS GRAFT    . EYE SURGERY Bilateral    catarascts removed-   . HERNIA REPAIR Bilateral 1946, 1990's   inguinal x3 procedures   . LUMBAR LAMINECTOMY/DECOMPRESSION MICRODISCECTOMY Left 08/14/2019   Procedure: Laminectomy for facet/synovial cyst - left - Lumbar three-Lumbar four;  Surgeon: Earnie Larsson, MD;  Location: [place];  Service: Neurosurgery;  Laterality: Left;  . TONSILLECTOMY      There were no vitals filed for this visit.   Subjective Assessment - 11/21/20 0905    Subjective L posterior hip is still sore. Has severe pain at times, other times it calmed down. 5/10 L hip currently.     Pertinent History L foot drop. Pt had lumbar fusion November 2020 which alleviated low back pain. Pt however had L foot drop afterwards. Had PT but still has his foot drop. Pt has trouble walking and wobbles and cannot seem to walk in a straight line. L leg feels weaker than his R. Denies loss of bowel or bladder control. Had numbness R great toe (L4 dermatome) since the surgery. No L LE numbness. Denies saddle anesthesia. Currently has L low back pain which developed recently about 2 months, gradual onset. [place] 4-5x a week.    Patient Stated Goals Walk more stable and straight.    Currently in Pain? Yes    Pain Score 5                                      PT Education - 11/21/20 0911    Education Details ther-ex, HEP    Person(s) Educated Patient    Methods Explanation;Demonstration;Tactile cues;Verbal cues;Handout    Comprehension Returned demonstration;Verbalized understanding             Objective  Lumbar fusiont L4-L5 with interbody spacer. Has L3 posterior elements  No Blood pressure problems per pt.  No latex allergies  (-) L calf squeeze   L foot drop   Gait: decreased L heel strike. L foot drop.    Muscle tension B lumbar paraspinal muscles.      Medbridge Access Code [email or web address]   Therapeutic exercise  Discontinued seated L hip IR. Pt demonstrated and verbalized understanding.   Seated L hip extension isometrics 10x5 seconds for 3 sets  No L hip pain afterwards.   Seated transversus abdominis contraction 10x3 with 5 seconds   Seated hip adductor glute max and pillow squeeze 10x3 with 5 second holds .    Seated assisted L ankle DF supine 10x3 with 5 second holds   Supine L hip extension isometrics with leg straight, R LE in hooklying position 10x5 seconds  Improved exercise technique, movement at target joints, use of target muscles after mod verbal, visual, tactile cues.     Manual therapy  Supine  STM L gastro/soleus muscles to decrease tension   Response to treatment No L posterior hip/back pain with treatment to promote L glute max muscle activation.   Clinical impression No pain after session after treatment to promote L glute max muscle activation. Worked on L ankle DF AAROM in supine to promote strength throughout range. Pt tolerated session well without aggravation of symptoms. Pt will benefit from continued skilled physical therapy services to decrease pain, improve strength and function.         PT Short Term Goals - 11/16/20 1101      PT SHORT TERM GOAL #1   Title Pt will be independent with his initial HEP to improve L ankle strength and AROM as well as decrease low back pain.    Baseline Pt has started his HEP. (11/16/2020)    Time 3    Period Weeks    Status New    Target Date 12/08/20             PT Long Term Goals - 11/16/20 1102      PT LONG TERM GOAL #1   Title Patient will improve L ankle AROM to 5 degrees or more to promote foot clearance during gait.    Baseline -17 degrees L ankle DF AROM (11/16/2020)    Time 12    Period Weeks    Status New    Target Date 02/09/21      PT LONG TERM GOAL #2   Title Pt will improve bilateral hip extension and abduction strength by at least 1/2 MMT grade to promote ability to perform tasks with less back pain.    Baseline Hip extension 4-/5 R and L, hip abduction 4/5 R and L (11/16/2020)    Time 12    Period Weeks    Status New    Target Date 02/09/21      PT LONG TERM GOAL #3   Title Pt will improve his leg FOTO score by at least 10 points as a demonstration of improved function.    Baseline Leg FOTO 69 (11/16/2020)    Time 12    Period Weeks    Status New    Target Date 02/09/21      PT LONG TERM GOAL #4   Title Pt will improve L ankle DF strength by at least 1/2 MMT grade to promote ability to ambulate with less difficulty.    Baseline L ankle DF 3-/5 (11/16/2020)  Time 12    Period Weeks    Status  New    Target Date 02/09/21                 Plan - 11/21/20 0912    Clinical Impression Statement No pain after session after treatment to promote L glute max muscle activation. Worked on L ankle DF AAROM in supine to promote strength throughout range. Pt tolerated session well without aggravation of symptoms. Pt will benefit from continued skilled physical therapy services to decrease pain, improve strength and function.    Personal Factors and Comorbidities Age;Comorbidity 3+;Past/Current Experience;Time since onset of injury/illness/exacerbation    Comorbidities CA, arthritis, CAD, MI, CVA, low back surgery    Examination-Activity Limitations Dressing;Transfers;Bend;Lift;Locomotion Level    Stability/Clinical Decision Making Stable/Uncomplicated    Clinical Decision Making Low    Rehab Potential Fair    PT Frequency 2x / week    PT Duration 12 weeks    PT Treatment/Interventions Therapeutic activities;Therapeutic exercise;Functional mobility training;Balance training;Neuromuscular re-education;Patient/family education;Manual techniques;Dry needling;Aquatic Therapy;Electrical Stimulation;Iontophoresis 4mg /ml Dexamethasone    PT Next Visit Plan Hip ROM, hip, trunk, and ankle strengthening, manual techniques, modalities PRN    PT Home Exercise Plan Medbridge Access Code [email or web address]    Consulted and Agree with Plan of Care Patient           Patient will benefit from skilled therapeutic intervention in order to improve the following deficits and impairments:  Pain,Postural dysfunction,Improper body mechanics,Difficulty walking,Decreased strength,Decreased range of motion  Visit Diagnosis: Chronic left-sided low back pain, unspecified whether sciatica present  Foot drop, left  Difficulty in walking, not elsewhere classified  Stiffness of left foot, not elsewhere classified     Problem List Patient Active Problem List   Diagnosis Date Noted  . Synovial cyst of lumbar facet  joint 08/14/2019   Joneen Boers PT, DPT   11/21/2020, 12:48 PM  [facility] [address] [place], [address] Phone: [phone]   Fax:  [phone]  Name: Juan [name] MRN: [id number] Date of Birth: [birth year]

## 2020-11-21 NOTE — Patient Instructions (Addendum)
Pt was recommended to hold off the seated L hip IR home exercise for now. Pt verbalized understanding.   Access Code: [email or web address] URL: [email or web address] Date: 11/21/2020 Prepared by: Joneen Boers  Exercises Seated Transversus Abdominis Bracing - 3 x daily - 7 x weekly - 3 sets - 10 reps - 5 seconds hold

## 2020-11-23 ENCOUNTER — Ambulatory Visit: Payer: PPO | Attending: Neurology

## 2020-11-23 ENCOUNTER — Other Ambulatory Visit: Payer: Self-pay

## 2020-11-23 DIAGNOSIS — M21372 Foot drop, left foot: Secondary | ICD-10-CM | POA: Diagnosis not present

## 2020-11-23 DIAGNOSIS — R262 Difficulty in walking, not elsewhere classified: Secondary | ICD-10-CM | POA: Diagnosis not present

## 2020-11-23 DIAGNOSIS — M25675 Stiffness of left foot, not elsewhere classified: Secondary | ICD-10-CM | POA: Insufficient documentation

## 2020-11-23 DIAGNOSIS — M545 Low back pain, unspecified: Secondary | ICD-10-CM | POA: Insufficient documentation

## 2020-11-23 DIAGNOSIS — G8929 Other chronic pain: Secondary | ICD-10-CM | POA: Diagnosis not present

## 2020-11-23 NOTE — Patient Instructions (Addendum)
  Sitting on a chair    Press your hands on your thighs to feel your abdominal muscles contract.   Hold for 5 seconds comfortably.   Repeat 10 times.   Perform 3 sets daily.      Access Code: [email or web address] URL: [email or web address] Date: 11/23/2020 Prepared by: Joneen Boers  Exercises Seated Transversus Abdominis Bracing - 3 x daily - 7 x weekly - 3 sets - 10 reps - 5 seconds hold Seated Hip Adduction Isometrics with Ball - 1 x daily - 7 x weekly - 3 sets - 10 reps - 5 seconds hold

## 2020-11-23 NOTE — Therapy (Signed)
[facility] [address], [place], [address] Phone: [phone]   Fax:  [phone]  Physical Therapy Treatment  Patient Details  Name: Juan [name] MRN: [id number] Date of Birth: Nov 19, 1940 Referring Provider (PT): Donn Pierini, MD   Encounter Date: 11/23/2020   PT End of Session - 11/23/20 0903    Visit Number 3    Number of Visits 25    Date for PT Re-Evaluation 02/09/21    Authorization Type 3    Authorization Time Period of 10    PT Start Time 0903    PT Stop Time 0948    PT Time Calculation (min) 45 min    Activity Tolerance Patient tolerated treatment well    Behavior During Therapy [facility] for tasks assessed/performed           Past Medical History:  Diagnosis Date  . Arthritis    back- low  . Atrial fibrillation ([place])   . Cancer (HCC)    skin ( basal) cell , face, head, back, inside L ear  . Coronary artery disease   . Dysrhythmia   . MI (myocardial infarction) ([place])   . Sleep apnea    +Cpap in use nightly , last study- 10 yrs. ago  . 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 APPENDECTOMY    . BACK SURGERY  09/2011   lumbar fusion   . CARDIAC CATHETERIZATION    . CARDIAC SURGERY    . CARDIAC VALVE REPLACEMENT    . CORONARY ARTERY BYPASS GRAFT    . EYE SURGERY Bilateral    catarascts removed-   . HERNIA REPAIR Bilateral 1946, 1990's   inguinal x3 procedures   . LUMBAR LAMINECTOMY/DECOMPRESSION MICRODISCECTOMY Left 08/14/2019   Procedure: Laminectomy for facet/synovial cyst - left - Lumbar three-Lumbar four;  Surgeon: Earnie Larsson, MD;  Location: [place];  Service: Neurosurgery;  Laterality: Left;  . TONSILLECTOMY      There were no vitals filed for this visit.   Subjective Assessment - 11/23/20 0904    Subjective Low back is pretty good. Had L lateral LE pain down his leg (L5 dermatome). 3/10 L hip pain tying his shoe and when  beding over, and getting up from the chair.    Pertinent History L foot drop. Pt had lumbar fusion November 2020 which alleviated low back pain. Pt however had L foot drop afterwards. Had PT but still has his foot drop. Pt has trouble walking and wobbles and cannot seem to walk in a straight line. L leg feels weaker than his R. Denies loss of bowel or bladder control. Had numbness R great toe (L4 dermatome) since the surgery. No L LE numbness. Denies saddle anesthesia. Currently has L low back pain which developed recently about 2 months, gradual onset. [place]5x [place].    Patient Stated Goals Walk more stable and straight.    Currently in Pain? Yes    Pain Score 3                                      PT Education - 11/23/20 0907    Education Details ther-ex, HEP    Person(s) Educated Patient    Methods Explanation;Demonstration;Tactile cues;Verbal cues;Handout    Comprehension Returned demonstration;Verbalized understanding  Objective   Lumbar fusiont L4-L5 with interbody spacer. Has L3 posterior elements  No Blood pressure problems per pt.  No latex allergies  (-) L calf squeeze   L foot drop  Gait: decreased L heel strike. L foot drop.   Muscle tension B lumbar paraspinal muscles.    MedbridgeAccess Code [email or web address]  Therapeutic exercise  Seated hip adduction ball squeeze with glute max squeeze 10x5 seconds for 3 sets  Decreased hip pain  Seated L hip IR isometrics in neutral, PT manual resistance 10x3 with 5 second holds   Seated transversus abdominis contraction 10x3 with 5 seconds   Decreased lumbar paraspinal muscle tension.   Seated hip adductor glute max and pillow squeeze 10x3 with 5 second holds .   Seated B shoulder extension 10x3 with 5 second holds. Decreased lumbar paraspinal muscle tension palpated   Seated assisted L ankle DF with strap 10x3 with 5 second holds   Seated manually  resisted trunk flexion isometrics in neutral, PT manual resistance 10x3 with 5 second holds     Improved exercise technique, movement at target joints, use of target muscles after mod verbal, visual, tactile cues.     Manual therapy  Seated STM lumbar paraspinal muscles to decrease tension      Response to treatment No L posterior hip/back pain after session.   Clinical impression No L hip pain after treatment to decrease L piriformis muscle tension. Continued working on improving transversus abdominis muscle strength to decrease paraspinal muscle tension to help decrease low back pain and hopefully improve L ankle DF. Continued working on assisted L ankle DF to end range to promote ability to strengthen tibialis anterior throughout range. Pt tolerated session well without aggravation of symptoms. Pt will benefit from continued skilled physical therapy services to decrease pain, improve strength and function.         PT Short Term Goals - 11/16/20 1101      PT SHORT TERM GOAL #1   Title Pt will be independent with his initial HEP to improve L ankle strength and AROM as well as decrease low back pain.    Baseline Pt has started his HEP. (11/16/2020)    Time 3    Period Weeks    Status New    Target Date 12/08/20             PT Long Term Goals - 11/16/20 1102      PT LONG TERM GOAL #1   Title Patient will improve L ankle AROM to 5 degrees or more to promote foot clearance during gait.    Baseline -17 degrees L ankle DF AROM (11/16/2020)    Time 12    Period Weeks    Status New    Target Date 02/09/21      PT LONG TERM GOAL #2   Title Pt will improve bilateral hip extension and abduction strength by at least 1/2 MMT grade to promote ability to perform tasks with less back pain.    Baseline Hip extension 4-/5 R and L, hip abduction 4/5 R and L (11/16/2020)    Time 12    Period Weeks    Status New    Target Date 02/09/21      PT LONG TERM GOAL #3   Title Pt  will improve his leg FOTO score by at least 10 points as a demonstration of improved function.    Baseline Leg FOTO 69 (11/16/2020)    Time 12    Period  Weeks    Status New    Target Date 02/09/21      PT LONG TERM GOAL #4   Title Pt will improve L ankle DF strength by at least 1/2 MMT grade to promote ability to ambulate with less difficulty.    Baseline L ankle DF 3-/5 (11/16/2020)    Time 12    Period Weeks    Status New    Target Date 02/09/21                 Plan - 11/23/20 0902    Clinical Impression Statement No L hip pain after treatment to decrease L piriformis muscle tension. Continued working on improving transversus abdominis muscle strength to decrease paraspinal muscle tension to help decrease low back pain and hopefully improve L ankle DF. Continued working on assisted L ankle DF to end range to promote ability to strengthen tibialis anterior throughout range. Pt tolerated session well without aggravation of symptoms. Pt will benefit from continued skilled physical therapy services to decrease pain, improve strength and function.    Personal Factors and Comorbidities Age;Comorbidity 3+;Past/Current Experience;Time since onset of injury/illness/exacerbation    Comorbidities CA, arthritis, CAD, MI, CVA, low back surgery    Examination-Activity Limitations Dressing;Transfers;Bend;Lift;Locomotion Level    Stability/Clinical Decision Making Stable/Uncomplicated    Clinical Decision Making Low    Rehab Potential Fair    PT Frequency 2x / week    PT Duration 12 weeks    PT Treatment/Interventions Therapeutic activities;Therapeutic exercise;Functional mobility training;Balance training;Neuromuscular re-education;Patient/family education;Manual techniques;Dry needling;Aquatic Therapy;Electrical Stimulation;Iontophoresis 4mg /ml Dexamethasone    PT Next Visit Plan Hip ROM, hip, trunk, and ankle strengthening, manual techniques, modalities PRN    PT Home Exercise Plan Medbridge  Access Code [email or web address]    Consulted and Agree with Plan of Care Patient           Patient will benefit from skilled therapeutic intervention in order to improve the following deficits and impairments:  Pain,Postural dysfunction,Improper body mechanics,Difficulty walking,Decreased strength,Decreased range of motion  Visit Diagnosis: Chronic left-sided low back pain, unspecified whether sciatica present  Foot drop, left  Difficulty in walking, not elsewhere classified     Problem List Patient Active Problem List   Diagnosis Date Noted  . Synovial cyst of lumbar facet joint 08/14/2019    Joneen Boers PT, DPT   11/23/2020, 9:54 AM  [facility] [address], [place], [address] Phone: [phone]   Fax:  [phone]  Name: Juan [name] MRN: [id number] Date of Birth: 06-05-1941

## 2020-11-29 DIAGNOSIS — M5442 Lumbago with sciatica, left side: Secondary | ICD-10-CM | POA: Diagnosis not present

## 2020-11-29 DIAGNOSIS — M545 Low back pain, unspecified: Secondary | ICD-10-CM | POA: Diagnosis not present

## 2020-11-30 ENCOUNTER — Ambulatory Visit: Payer: PPO

## 2020-11-30 ENCOUNTER — Other Ambulatory Visit: Payer: Self-pay

## 2020-11-30 DIAGNOSIS — G8929 Other chronic pain: Secondary | ICD-10-CM

## 2020-11-30 DIAGNOSIS — M545 Low back pain, unspecified: Secondary | ICD-10-CM | POA: Diagnosis not present

## 2020-11-30 DIAGNOSIS — R262 Difficulty in walking, not elsewhere classified: Secondary | ICD-10-CM

## 2020-11-30 DIAGNOSIS — M21372 Foot drop, left foot: Secondary | ICD-10-CM

## 2020-11-30 DIAGNOSIS — M25675 Stiffness of left foot, not elsewhere classified: Secondary | ICD-10-CM

## 2020-11-30 NOTE — Therapy (Signed)
[facility] [address] [place], [address] Phone: [phone]   Fax:  [phone]  Physical Therapy Treatment  Patient Details  Name: Juan [name] MRN: [id number] Date of Birth: May 13, 1941 Referring Provider (PT): Donn Pierini, MD   Encounter Date: 11/30/2020   PT End of Session - 11/30/20 1059    Visit Number 4    Number of Visits 25    Date for PT Re-Evaluation 02/09/21    Authorization Type 4    Authorization Time Period of 10    PT Start Time 1100    PT Stop Time 1131    PT Time Calculation (min) 31 min    Activity Tolerance Patient tolerated treatment well    Behavior During Therapy [facility] for tasks assessed/performed           Past Medical History:  Diagnosis Date  . Arthritis    back- low  . Atrial fibrillation ([place])   . Cancer (HCC)    skin ( basal) cell , face, head, back, inside L ear  . Coronary artery disease   . Dysrhythmia   . MI (myocardial infarction) ([place])   . Sleep apnea    +Cpap in use nightly , last study- 10 yrs. ago  . 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 APPENDECTOMY    . BACK SURGERY  09/2011   lumbar fusion   . CARDIAC CATHETERIZATION    . CARDIAC SURGERY    . CARDIAC VALVE REPLACEMENT    . CORONARY ARTERY BYPASS GRAFT    . EYE SURGERY Bilateral    catarascts removed-   . HERNIA REPAIR Bilateral 1946, 1990's   inguinal x3 procedures   . LUMBAR LAMINECTOMY/DECOMPRESSION MICRODISCECTOMY Left 08/14/2019   Procedure: Laminectomy for facet/synovial cyst - left - Lumbar three-Lumbar four;  Surgeon: Earnie Larsson, MD;  Location: [place];  Service: Neurosurgery;  Laterality: Left;  . TONSILLECTOMY      There were no vitals filed for this visit.   Subjective Assessment - 11/30/20 1101    Subjective L low bothered him this weekend and saw the doctor yesterday. Had an x-ray for his back which did not reveal  abnormality. 3/10 L low back currently. No L LE radiating symptoms. Currently taking tizanadine and tramadol as needed. Last tramadol was was yesterday. Took tizanadine yesterday which made him feel out of it. Does not plan on continuing tizanadine due to side effects.    Pertinent History L foot drop. Pt had lumbar fusion November 2020 which alleviated low back pain. Pt however had L foot drop afterwards. Had PT but still has his foot drop. Pt has trouble walking and wobbles and cannot seem to walk in a straight line. L leg feels weaker than his R. Denies loss of bowel or bladder control. Had numbness R great toe (L4 dermatome) since the surgery. No L LE numbness. Denies saddle anesthesia. Currently has L low back pain which developed recently about 2 months, gradual onset. [place] 4-5x a week.    Patient Stated Goals Walk more stable and straight.    Currently in Pain? Yes    Pain Score 3                                      PT Education -  11/30/20 1107    Education Details ther-ex    Person(s) Educated Patient    Methods Explanation;Demonstration;Tactile cues;Verbal cues    Comprehension Returned demonstration;Verbalized understanding           Objective   Lumbar fusiont L4-L5 with interbody spacer. Has L3 posterior elements  No Blood pressure problems per pt.  No latex allergies  (-) L calf squeeze   L foot drop  Gait: decreased L heel strike. L foot drop.   Muscle tension B lumbar paraspinal muscles.    MedbridgeAccess Code [email or web address]  Therapeutic exercise  Seated Gentle L hip IR isometrics in neutral, PT manual resistance 10x3 with 5 second holds. No posterior L hip pain afterwards   Seated hip adduction ball squeeze with glute max squeeze 10x5 seconds for 3 sets             Seated B scapular retraction to promote thoracic extension and decrease low back stress 10x5 seconds for 3 sets  Seated PT assisted L ankle  DF 10x3 with 5 second holds   Seated transversus abdominis contraction 10x3 with 5 seconds              Decreased lumbar paraspinal muscle tension palpated      Improved exercise technique, movement at target joints, use of target muscles after min to mod verbal, visual, tactile cues.      Response to treatment No L posterior hip/back pain after session.   Clinical impression Light session performed today so as to not aggravate L low back and L LE symptoms. Worked on decreasing L piriformis muscle activation as well as improving core muscle activation to decrease lumbar paraspinal muscle tension and improving gentle thoracic extension (scapular retraction exercise) to help decrease stress to low back. No L low back/posterior hip pain after session. Pt will benefit from continued skilled physical therapy services to decrease pain, improve strength and function.        PT Short Term Goals - 11/16/20 1101      PT SHORT TERM GOAL #1   Title Pt will be independent with his initial HEP to improve L ankle strength and AROM as well as decrease low back pain.    Baseline Pt has started his HEP. (11/16/2020)    Time 3    Period Weeks    Status New    Target Date 12/08/20             PT Long Term Goals - 11/16/20 1102      PT LONG TERM GOAL #1   Title Patient will improve L ankle AROM to 5 degrees or more to promote foot clearance during gait.    Baseline -17 degrees L ankle DF AROM (11/16/2020)    Time 12    Period Weeks    Status New    Target Date 02/09/21      PT LONG TERM GOAL #2   Title Pt will improve bilateral hip extension and abduction strength by at least 1/2 MMT grade to promote ability to perform tasks with less back pain.    Baseline Hip extension 4-/5 R and L, hip abduction 4/5 R and L (11/16/2020)    Time 12    Period Weeks    Status New    Target Date 02/09/21      PT LONG TERM GOAL #3   Title Pt will improve his leg FOTO score by at least 10  points as a demonstration of improved function.    Baseline  Leg FOTO 69 (11/16/2020)    Time 12    Period Weeks    Status New    Target Date 02/09/21      PT LONG TERM GOAL #4   Title Pt will improve L ankle DF strength by at least 1/2 MMT grade to promote ability to ambulate with less difficulty.    Baseline L ankle DF 3-/5 (11/16/2020)    Time 12    Period Weeks    Status New    Target Date 02/09/21                 Plan - 11/30/20 1050    Clinical Impression Statement Light session performed today so as to not aggravate L low back and L LE symptoms. Worked on decreasing L piriformis muscle activation as well as improving core muscle activation to decrease lumbar paraspinal muscle tension and improving gentle thoracic extension (scapular retraction exercise) to help decrease stress to low back. No L low back/posterior hip pain after session. Pt will benefit from continued skilled physical therapy services to decrease pain, improve strength and function.    Personal Factors and Comorbidities Age;Comorbidity 3+;Past/Current Experience;Time since onset of injury/illness/exacerbation    Comorbidities CA, arthritis, CAD, MI, CVA, low back surgery    Examination-Activity Limitations Dressing;Transfers;Bend;Lift;Locomotion Level    Stability/Clinical Decision Making Stable/Uncomplicated    Rehab Potential Fair    PT Frequency 2x / week    PT Duration 12 weeks    PT Treatment/Interventions Therapeutic activities;Therapeutic exercise;Functional mobility training;Balance training;Neuromuscular re-education;Patient/family education;Manual techniques;Dry needling;Aquatic Therapy;Electrical Stimulation;Iontophoresis 4mg /ml Dexamethasone    PT Next Visit Plan Hip ROM, hip, trunk, and ankle strengthening, manual techniques, modalities PRN    PT Home Exercise Plan Medbridge Access Code [email or web address]    Consulted and Agree with Plan of Care Patient           Patient will benefit from skilled  therapeutic intervention in order to improve the following deficits and impairments:  Pain,Postural dysfunction,Improper body mechanics,Difficulty walking,Decreased strength,Decreased range of motion  Visit Diagnosis: Chronic left-sided low back pain, unspecified whether sciatica present  Foot drop, left  Difficulty in walking, not elsewhere classified  Stiffness of left foot, not elsewhere classified     Problem List Patient Active Problem List   Diagnosis Date Noted  . Synovial cyst of lumbar facet joint 08/14/2019    Joneen Boers PT, DPT   11/30/2020, 11:40 AM  [facility] [address], [place], [address] Phone: [phone]   Fax:  [phone]  Name: Juan [name] MRN: [id number] Date of Birth: 1940-11-25

## 2020-11-30 NOTE — Patient Instructions (Signed)
  Pt was recommended to bend his L knee during the calf stretch/assisted ankle DF exercise at home. Pt verbalized understanding.

## 2020-12-05 ENCOUNTER — Other Ambulatory Visit: Payer: Self-pay

## 2020-12-05 ENCOUNTER — Ambulatory Visit: Payer: PPO

## 2020-12-05 DIAGNOSIS — M21372 Foot drop, left foot: Secondary | ICD-10-CM

## 2020-12-05 DIAGNOSIS — R262 Difficulty in walking, not elsewhere classified: Secondary | ICD-10-CM

## 2020-12-05 DIAGNOSIS — M545 Low back pain, unspecified: Secondary | ICD-10-CM | POA: Diagnosis not present

## 2020-12-05 DIAGNOSIS — G8929 Other chronic pain: Secondary | ICD-10-CM

## 2020-12-05 NOTE — Therapy (Signed)
[facility] [address] [place], [address] Phone: [phone]   Fax:  [phone]  Physical Therapy Treatment  Patient Details  Name: Juan [name] MRN: [id number] Date of Birth: Aug 10, 1941 Referring Provider (PT): Donn Pierini, MD   Encounter Date: 12/05/2020   PT End of Session - 12/05/20 0934    Visit Number 5    Number of Visits 25    Date for PT Re-Evaluation 02/09/21    Authorization Type 5    Authorization Time Period of 10    PT Start Time 0934    PT Stop Time 1015    PT Time Calculation (min) 41 min    Activity Tolerance Patient tolerated treatment well    Behavior During Therapy [facility] for tasks assessed/performed           Past Medical History:  Diagnosis Date  . Arthritis    back- low  . Atrial fibrillation ([place])   . Cancer (HCC)    skin ( basal) cell , face, head, back, inside L ear  . Coronary artery disease   . Dysrhythmia   . MI (myocardial infarction) ([place])   . Sleep apnea    +Cpap in use nightly , last study- 10 yrs. ago  . 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 APPENDECTOMY    . BACK SURGERY  09/2011   lumbar fusion   . CARDIAC CATHETERIZATION    . CARDIAC SURGERY    . CARDIAC VALVE REPLACEMENT    . CORONARY ARTERY BYPASS GRAFT    . EYE SURGERY Bilateral    catarascts removed-   . HERNIA REPAIR Bilateral 1946, 1990's   inguinal x3 procedures   . LUMBAR LAMINECTOMY/DECOMPRESSION MICRODISCECTOMY Left 08/14/2019   Procedure: Laminectomy for facet/synovial cyst - left - Lumbar three-Lumbar four;  Surgeon: Earnie Larsson, MD;  Location: [place];  Service: Neurosurgery;  Laterality: Left;  . TONSILLECTOMY      There were no vitals filed for this visit.   Subjective Assessment - 12/05/20 0935    Subjective Back still has some pain there, 3/10 currently. Also felt L LE symptoms (L5 dermatome), took a couple of tylenol  which helps. Was ok, did alright after last session. Doctor said that the foot strength should come back after a year from the surgery. The strength has come back some but not as much as he would like.    Pertinent History L foot drop. Pt had lumbar fusion November 2020 which alleviated low back pain. Pt however had L foot drop afterwards. Had PT but still has his foot drop. Pt has trouble walking and wobbles and cannot seem to walk in a straight line. L leg feels weaker than his R. Denies loss of bowel or bladder control. Had numbness R great toe (L4 dermatome) since the surgery. No L LE numbness. Denies saddle anesthesia. Currently has L low back pain which developed recently about 2 months, gradual onset. [place]5x [place].    Patient Stated Goals Walk more stable and straight.    Currently in Pain? Yes    Pain Score 3                                      PT Education - 12/05/20 0947    Education  Details ther-ex    Person(s) Educated Patient    Methods Explanation;Demonstration;Tactile cues;Verbal cues    Comprehension Returned demonstration;Verbalized understanding           Objective   Lumbar fusiont L4-L5 with interbody spacer. Has L3 posterior elements  No Blood pressure problems per pt.  No latex allergies  (-) L calf squeeze   L foot drop  Gait: decreased L heel strike. L foot drop.   Muscle tension B lumbar paraspinal muscles.    MedbridgeAccess Code [email or web address]  Therapeutic exercise   Seated PT assisted L ankle DF10x3 with 5 second holds   Standing with B UE assist   Hip abduction    L 10x5 seconds, then with yellow band 10x2 with 5 second holds   R 10x5 seconds, then with yellow band 10x2 with 5 second holds  Standing heel walk with one UE assist 10x 63ft   Pt was recommended to remove rugs and other tripping hazards from the floor. Pt verbalized understanding.   No TTP L posterior hip. Muscle  atrophy palpated  S/L clamshell   L 10x5 seconds for 3 sets  hooklying transversus abdominis contraction 10x5 seconds for 2 sets  Bridge with trunk and glute muscle activation 10x3  Weak trunk control observed  Supine L ankle DF AAROM with PT 10x     Improved exercise technique, movement at target joints, use of target muscles after min to mod verbal, visual, tactile cues.   Response to treatment No painafter session.  Clinical impression Weak trunk and glute muscles observed with bridge exercise. Continued working on trunk, glute, and tibialis muscle strengthening to help decrease stress to low back.  Tibialis muscle contraction palpated. Worked on strengthening of available muscles to help improve DF AROM. Pt tolerated session well without aggravation of symptoms. Pt will benefit from continued skilled physical therapy services to decrease pain, improve strength and function.       PT Short Term Goals - 11/16/20 1101      PT SHORT TERM GOAL #1   Title Pt will be independent with his initial HEP to improve L ankle strength and AROM as well as decrease low back pain.    Baseline Pt has started his HEP. (11/16/2020)    Time 3    Period Weeks    Status New    Target Date 12/08/20             PT Long Term Goals - 11/16/20 1102      PT LONG TERM GOAL #1   Title Patient will improve L ankle AROM to 5 degrees or more to promote foot clearance during gait.    Baseline -17 degrees L ankle DF AROM (11/16/2020)    Time 12    Period Weeks    Status New    Target Date 02/09/21      PT LONG TERM GOAL #2   Title Pt will improve bilateral hip extension and abduction strength by at least 1/2 MMT grade to promote ability to perform tasks with less back pain.    Baseline Hip extension 4-/5 R and L, hip abduction 4/5 R and L (11/16/2020)    Time 12    Period Weeks    Status New    Target Date 02/09/21      PT LONG TERM GOAL #3   Title Pt will improve his leg FOTO score  by at least 10 points as a demonstration of improved function.    Baseline Leg FOTO  69 (11/16/2020)    Time 12    Period Weeks    Status New    Target Date 02/09/21      PT LONG TERM GOAL #4   Title Pt will improve L ankle DF strength by at least 1/2 MMT grade to promote ability to ambulate with less difficulty.    Baseline L ankle DF 3-/5 (11/16/2020)    Time 12    Period Weeks    Status New    Target Date 02/09/21                 Plan - 12/05/20 0947    Clinical Impression Statement Weak trunk and glute muscles observed with bridge exercise. Continued working on trunk, glute, and tibialis muscle strengthening to help decrease stress to low back.  Tibialis muscle contraction palpated. Worked on strengthening of available muscles to help improve DF AROM. Pt tolerated session well without aggravation of symptoms. Pt will benefit from continued skilled physical therapy services to decrease pain, improve strength and function.    Personal Factors and Comorbidities Age;Comorbidity 3+;Past/Current Experience;Time since onset of injury/illness/exacerbation    Comorbidities CA, arthritis, CAD, MI, CVA, low back surgery    Examination-Activity Limitations Dressing;Transfers;Bend;Lift;Locomotion Level    Stability/Clinical Decision Making Stable/Uncomplicated    Clinical Decision Making Low    Rehab Potential Fair    PT Frequency 2x / week    PT Duration 12 weeks    PT Treatment/Interventions Therapeutic activities;Therapeutic exercise;Functional mobility training;Balance training;Neuromuscular re-education;Patient/family education;Manual techniques;Dry needling;Aquatic Therapy;Electrical Stimulation;Iontophoresis 4mg /ml Dexamethasone    PT Next Visit Plan Hip ROM, hip, trunk, and ankle strengthening, manual techniques, modalities PRN    PT Home Exercise Plan Medbridge Access Code [email or web address]    Consulted and Agree with Plan of Care Patient           Patient will benefit from skilled  therapeutic intervention in order to improve the following deficits and impairments:  Pain,Postural dysfunction,Improper body mechanics,Difficulty walking,Decreased strength,Decreased range of motion  Visit Diagnosis: Chronic left-sided low back pain, unspecified whether sciatica present  Foot drop, left  Difficulty in walking, not elsewhere classified     Problem List Patient Active Problem List   Diagnosis Date Noted  . Synovial cyst of lumbar facet joint 08/14/2019    Joneen Boers PT, DPT   12/05/2020, 3:19 PM  [facility] [address], [place], [address] Phone: [phone]   Fax:  [phone]  Name: Juan [name] MRN: [id number] Date of Birth: [birth year]

## 2020-12-08 ENCOUNTER — Ambulatory Visit: Payer: PPO

## 2020-12-08 ENCOUNTER — Other Ambulatory Visit: Payer: Self-pay

## 2020-12-08 DIAGNOSIS — M5416 Radiculopathy, lumbar region: Secondary | ICD-10-CM | POA: Diagnosis not present

## 2020-12-08 DIAGNOSIS — G8929 Other chronic pain: Secondary | ICD-10-CM

## 2020-12-08 DIAGNOSIS — M545 Low back pain, unspecified: Secondary | ICD-10-CM | POA: Diagnosis not present

## 2020-12-08 DIAGNOSIS — M21372 Foot drop, left foot: Secondary | ICD-10-CM

## 2020-12-08 DIAGNOSIS — R262 Difficulty in walking, not elsewhere classified: Secondary | ICD-10-CM

## 2020-12-08 DIAGNOSIS — Z981 Arthrodesis status: Secondary | ICD-10-CM | POA: Diagnosis not present

## 2020-12-08 NOTE — Therapy (Signed)
[facility] [address], [place], [address] Phone: [phone]   Fax:  [phone]  Physical Therapy Treatment  Patient Details  Name: Juan [name] MRN: [id number] Date of Birth: 06-30-1941 Referring Provider (PT): Donn Pierini, MD   Encounter Date: 12/08/2020   PT End of Session - 12/08/20 0849    Number of Visits 25    Date for PT Re-Evaluation 02/09/21    Authorization Type 5    Authorization Time Period of 10    PT Start Time 0850    PT Stop Time 0932    PT Time Calculation (min) 42 min    Activity Tolerance Patient tolerated treatment well    Behavior During Therapy [facility] for tasks assessed/performed           Past Medical History:  Diagnosis Date  . Arthritis    back- low  . Atrial fibrillation ([place])   . Cancer (HCC)    skin ( basal) cell , face, head, back, inside L ear  . Coronary artery disease   . Dysrhythmia   . MI (myocardial infarction) ([place])   . Sleep apnea    +Cpap in use nightly , last study- 10 yrs. ago  . 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 APPENDECTOMY    . BACK SURGERY  09/2011   lumbar fusion   . CARDIAC CATHETERIZATION    . CARDIAC SURGERY    . CARDIAC VALVE REPLACEMENT    . CORONARY ARTERY BYPASS GRAFT    . EYE SURGERY Bilateral    catarascts removed-   . HERNIA REPAIR Bilateral 1946, 1990's   inguinal x3 procedures   . LUMBAR LAMINECTOMY/DECOMPRESSION MICRODISCECTOMY Left 08/14/2019   Procedure: Laminectomy for facet/synovial cyst - left - Lumbar three-Lumbar four;  Surgeon: Earnie Larsson, MD;  Location: [facility];  Service: Neurosurgery;  Laterality: Left;  . TONSILLECTOMY      There were no vitals filed for this visit.   Subjective Assessment - 12/08/20 0851    Subjective L posterior hip is mild. If he stands for a little while such as showering and shaving and fixing a bowl or cerial (goes up to a 5/10),  sitting down eases the low back/hip pain off. 2/10 L hip/low back pain currently.    Pertinent History L foot drop. Pt had lumbar fusion November 2020 which alleviated low back pain. Pt however had L foot drop afterwards. Had PT but still has his foot drop. Pt has trouble walking and wobbles and cannot seem to walk in a straight line. L leg feels weaker than his R. Denies loss of bowel or bladder control. Had numbness R great toe (L4 dermatome) since the surgery. No L LE numbness. Denies saddle anesthesia. Currently has L low back pain which developed recently about 2 months, gradual onset. [place] mall 4-5x a week.    Patient Stated Goals Walk more stable and straight.    Currently in Pain? Yes    Pain Score 2                                      PT Education - 12/08/20 0853    Education Details ther-ex    Person(s) Educated Patient    Methods Explanation;Demonstration;Tactile cues;Verbal cues    Comprehension Returned demonstration;Verbalized understanding  Objective   Lumbar fusiont L4-L5 with interbody spacer. Has L3 posterior elements  No Blood pressure problems per pt.  No latex allergies  (-) L calf squeeze   L foot drop  Gait: decreased L heel strike. L foot drop.   Muscle tension B lumbar paraspinal muscles.    MedbridgeAccess Code [email or web address]  Therapeutic exercise   Seated glute max squeeze 10x5 seconds for 2 sets  Seated B shoulder extension isometrics, hands on thighs 10x5 seconds   Hooklying posterior pelvic tilt 10x5 seconds for 3   hooklying transversus abdominis contraction 10x5 seconds  Supine hip fallouts 5x3 each LE   Difficulty with lumbopelvic and hip control L > R  Cues to help address.   Supine L LE PNF D2 flexion with PT assist 10x3 to promote L ankle DF   Good muscle contraction for ankle DF palpated  Supine L ankle DF AAROM with PT 10x3 with 5 second holds   Supine L leg  extension in L hip flexion to 90 degrees position 10x2     Improved exercise technique, movement at target joints, use of target muscles aftermin tomod verbal, visual, tactile cues.   Response to treatment No L low back and posterior hip pain after session.   Clinical impression Pt demonstrates weak lumbopelvic control. Core strengthening and neuromuscular control exercises performed to help address. Good L ankle DF contraction palpated during L LE PNF D2 flexion assisted exercise. Tight end feel with assisted L ankle DF. Pt tolerated session well without aggravation of symptoms. Pt will benefit from continued skilled physical therapy services to decrease pain, improve strength, ankle DF, and function.       PT Short Term Goals - 11/16/20 1101      PT SHORT TERM GOAL #1   Title Pt will be independent with his initial HEP to improve L ankle strength and AROM as well as decrease low back pain.    Baseline Pt has started his HEP. (11/16/2020)    Time 3    Period Weeks    Status New    Target Date 12/08/20             PT Long Term Goals - 11/16/20 1102      PT LONG TERM GOAL #1   Title Patient will improve L ankle AROM to 5 degrees or more to promote foot clearance during gait.    Baseline -17 degrees L ankle DF AROM (11/16/2020)    Time 12    Period Weeks    Status New    Target Date 02/09/21      PT LONG TERM GOAL #2   Title Pt will improve bilateral hip extension and abduction strength by at least 1/2 MMT grade to promote ability to perform tasks with less back pain.    Baseline Hip extension 4-/5 R and L, hip abduction 4/5 R and L (11/16/2020)    Time 12    Period Weeks    Status New    Target Date 02/09/21      PT LONG TERM GOAL #3   Title Pt will improve his leg FOTO score by at least 10 points as a demonstration of improved function.    Baseline Leg FOTO 69 (11/16/2020)    Time 12    Period Weeks    Status New    Target Date 02/09/21      PT LONG TERM  GOAL #4   Title Pt will improve L ankle DF strength by at  least 1/2 MMT grade to promote ability to ambulate with less difficulty.    Baseline L ankle DF 3-/5 (11/16/2020)    Time 12    Period Weeks    Status New    Target Date 02/09/21                 Plan - 12/08/20 0855    Clinical Impression Statement Pt demonstrates weak lumbopelvic control. Core strengthening and neuromuscular control exercises performed to help address. Good L ankle DF contraction palpated during L LE PNF D2 flexion assisted exercise. Tight end feel with assisted L ankle DF. Pt tolerated session well without aggravation of symptoms. Pt will benefit from continued skilled physical therapy services to decrease pain, improve strength, ankle DF, and function.    Personal Factors and Comorbidities Age;Comorbidity 3+;Past/Current Experience;Time since onset of injury/illness/exacerbation    Comorbidities CA, arthritis, CAD, MI, CVA, low back surgery    Examination-Activity Limitations Dressing;Transfers;Bend;Lift;Locomotion Level    Stability/Clinical Decision Making Stable/Uncomplicated    Clinical Decision Making Low    Rehab Potential Fair    PT Frequency 2x / week    PT Duration 12 weeks    PT Treatment/Interventions Therapeutic activities;Therapeutic exercise;Functional mobility training;Balance training;Neuromuscular re-education;Patient/family education;Manual techniques;Dry needling;Aquatic Therapy;Electrical Stimulation;Iontophoresis 4mg /ml Dexamethasone    PT Next Visit Plan Hip ROM, hip, trunk, and ankle strengthening, manual techniques, modalities PRN    PT Home Exercise Plan Medbridge Access Code [email or web address]    Consulted and Agree with Plan of Care Patient           Patient will benefit from skilled therapeutic intervention in order to improve the following deficits and impairments:  Pain,Postural dysfunction,Improper body mechanics,Difficulty walking,Decreased strength,Decreased range of motion  Visit  Diagnosis: Chronic left-sided low back pain, unspecified whether sciatica present  Foot drop, left  Difficulty in walking, not elsewhere classified     Problem List Patient Active Problem List   Diagnosis Date Noted  . Synovial cyst of lumbar facet joint 08/14/2019    Joneen Boers PT, DPT   12/08/2020, 9:35 AM  [facility] [address] [place], [address] Phone: [phone]   Fax:  [phone]  Name: Glenn Gullickson MRN: [id number] Date of Birth: 1941-08-06

## 2020-12-13 ENCOUNTER — Ambulatory Visit: Payer: PPO

## 2020-12-13 ENCOUNTER — Other Ambulatory Visit: Payer: Self-pay

## 2020-12-13 DIAGNOSIS — M21372 Foot drop, left foot: Secondary | ICD-10-CM

## 2020-12-13 DIAGNOSIS — R262 Difficulty in walking, not elsewhere classified: Secondary | ICD-10-CM

## 2020-12-13 DIAGNOSIS — M545 Low back pain, unspecified: Secondary | ICD-10-CM | POA: Diagnosis not present

## 2020-12-13 DIAGNOSIS — G8929 Other chronic pain: Secondary | ICD-10-CM

## 2020-12-13 NOTE — Patient Instructions (Signed)
Access Code: [email or web address] URL: [email or web address] Date: 12/13/2020 Prepared by: Joneen Boers  Exercises Seated Transversus Abdominis Bracing - 3 x daily - 7 x weekly - 3 sets - 10 reps - 5 seconds hold Seated Hip Adduction Isometrics with Ball - 1 x daily - 7 x weekly - 3 sets - 10 reps - 5 seconds hold Seated Calf Stretch with Strap - 1 x daily - 7 x weekly - 3 sets - 10 reps - 5 secons hold Bent Knee Fallouts - 1 x daily - 7 x weekly - 1-3 sets - 5-10 reps Standing Bilateral Gastroc Stretch with Step - 3 x daily - 7 x weekly - 1 sets - 10 reps - 10 seconds hold

## 2020-12-13 NOTE — Therapy (Signed)
[facility] [address], [place], [address] Phone: [phone]   Fax:  [phone]  Physical Therapy Treatment  Patient Details  Name: Juan [name] MRN: [id number] Date of Birth: Jul 14, 1941 Referring Provider (PT): Donn Pierini, MD   Encounter Date: 12/13/2020   PT End of Session - 12/13/20 0934    Visit Number 7    Number of Visits 25    Date for PT Re-Evaluation 02/09/21    Authorization Type 7    Authorization Time Period of 10    PT Start Time 0935    PT Stop Time 1015    PT Time Calculation (min) 40 min    Activity Tolerance Patient tolerated treatment well    Behavior During Therapy [facility] for tasks assessed/performed           Past Medical History:  Diagnosis Date  . Arthritis    back- low  . Atrial fibrillation ([place])   . Cancer (HCC)    skin ( basal) cell , face, head, back, inside L ear  . Coronary artery disease   . Dysrhythmia   . MI (myocardial infarction) ([place])   . Sleep apnea    +Cpap in use nightly , last study- 10 yrs. ago  . 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 APPENDECTOMY    . BACK SURGERY  09/2011   lumbar fusion   . CARDIAC CATHETERIZATION    . CARDIAC SURGERY    . CARDIAC VALVE REPLACEMENT    . CORONARY ARTERY BYPASS GRAFT    . EYE SURGERY Bilateral    catarascts removed-   . HERNIA REPAIR Bilateral 1946, 1990's   inguinal x3 procedures   . LUMBAR LAMINECTOMY/DECOMPRESSION MICRODISCECTOMY Left 08/14/2019   Procedure: Laminectomy for facet/synovial cyst - left - Lumbar three-Lumbar four;  Surgeon: Earnie Larsson, MD;  Location: [facility];  Service: Neurosurgery;  Laterality: Left;  . TONSILLECTOMY      There were no vitals filed for this visit.   Subjective Assessment - 12/13/20 0936    Subjective L posterior hip/low back hurts at times. Turning from his L side to R side bothered his L LE (L5 dermatome). No pain  currently. L hip pain eventually goes away if he lays on his L side.    Pertinent History L foot drop. Pt had lumbar fusion November 2020 which alleviated low back pain. Pt however had L foot drop afterwards. Had PT but still has his foot drop. Pt has trouble walking and wobbles and cannot seem to walk in a straight line. L leg feels weaker than his R. Denies loss of bowel or bladder control. Had numbness R great toe (L4 dermatome) since the surgery. No L LE numbness. Denies saddle anesthesia. Currently has L low back pain which developed recently about 2 months, gradual onset. [place] 4-5x a week.    Patient Stated Goals Walk more stable and straight.    Currently in Pain? No/denies                                     PT Education - 12/13/20 0941    Education Details ther-ex, HEP    Person(s) Educated Patient    Methods Explanation;Demonstration;Tactile cues;Verbal cues;Handout    Comprehension Returned demonstration;Verbalized understanding  Objective   Lumbar fusiont L4-L5 with interbody spacer. Has L3 posterior elements  No Blood pressure problems per pt.  No latex allergies  (-) L calf squeeze   L foot drop  Gait: decreased L heel strike. L foot drop.   Muscle tension B lumbar paraspinal muscles.    MedbridgeAccess Code [email or web address]  Therapeutic exercise L S/L to R S/L  2x. No pain.   Supine posterior pelvic tilt with transversus abdominis contraction 10x5 seconds  L posterior hip discomfort which eases with rest  Supine transversus abdominis contraction with hip fallouts 10x each LE   Supine bridge 10x3  hooklying hip adduction ball and glute max squeeze 10x5 seconds    Supine L LE PNF D2 flexion with PT assist 10x3 to promote L ankle DF              Good muscle contraction for ankle DF palpated   Supine L ankle DF AAROM with PT 10x3 with 5 second holds   Tight gastroc muscle palapted  Supine  L leg extension in L hip flexion to 90 degrees position 10x3   Standing gastroc stretch at stair step 10sec x 10  Seated B ankle DF with PT assist for L ankle 10x  Stiff end feel L ankle   Improved exercise technique, movement at target joints, use of target muscles aftermin tomod verbal, visual, tactile cues.   Response to treatment No L low back and posterior hip pain after session.   Clinical impression Continued working on core strengthening and lumbopelvic control secondary to weakness. Stiff end feel with L ankle DF with addition to weakness. Able to perform DF part way. No pain reported at end of session. Pt will benefit from continued skilled physical therapy services to decrease pain, improve strength and function.         PT Short Term Goals - 11/16/20 1101      PT SHORT TERM GOAL #1   Title Pt will be independent with his initial HEP to improve L ankle strength and AROM as well as decrease low back pain.    Baseline Pt has started his HEP. (11/16/2020)    Time 3    Period Weeks    Status New    Target Date 12/08/20             PT Long Term Goals - 11/16/20 1102      PT LONG TERM GOAL #1   Title Patient will improve L ankle AROM to 5 degrees or more to promote foot clearance during gait.    Baseline -17 degrees L ankle DF AROM (11/16/2020)    Time 12    Period Weeks    Status New    Target Date 02/09/21      PT LONG TERM GOAL #2   Title Pt will improve bilateral hip extension and abduction strength by at least 1/2 MMT grade to promote ability to perform tasks with less back pain.    Baseline Hip extension 4-/5 R and L, hip abduction 4/5 R and L (11/16/2020)    Time 12    Period Weeks    Status New    Target Date 02/09/21      PT LONG TERM GOAL #3   Title Pt will improve his leg FOTO score by at least 10 points as a demonstration of improved function.    Baseline Leg FOTO 69 (11/16/2020)    Time 12    Period Weeks    Status New  Target Date  02/09/21      PT LONG TERM GOAL #4   Title Pt will improve L ankle DF strength by at least 1/2 MMT grade to promote ability to ambulate with less difficulty.    Baseline L ankle DF 3-/5 (11/16/2020)    Time 12    Period Weeks    Status New    Target Date 02/09/21                 Plan - 12/13/20 0941    Clinical Impression Statement Continued working on core strengthening and lumbopelvic control secondary to weakness. Stiff end feel with L ankle DF with addition to weakness. Able to perform DF part way. No pain reported at end of session. Pt will benefit from continued skilled physical therapy services to decrease pain, improve strength and function.    Personal Factors and Comorbidities Age;Comorbidity 3+;Past/Current Experience;Time since onset of injury/illness/exacerbation    Comorbidities CA, arthritis, CAD, MI, CVA, low back surgery    Examination-Activity Limitations Dressing;Transfers;Bend;Lift;Locomotion Level    Stability/Clinical Decision Making Stable/Uncomplicated    Clinical Decision Making Low    Rehab Potential Fair    PT Frequency 2x / week    PT Duration 12 weeks    PT Treatment/Interventions Therapeutic activities;Therapeutic exercise;Functional mobility training;Balance training;Neuromuscular re-education;Patient/family education;Manual techniques;Dry needling;Aquatic Therapy;Electrical Stimulation;Iontophoresis 4mg /ml Dexamethasone    PT Next Visit Plan Hip ROM, hip, trunk, and ankle strengthening, manual techniques, modalities PRN    PT Home Exercise Plan Medbridge Access Code [email or web address]    Consulted and Agree with Plan of Care Patient           Patient will benefit from skilled therapeutic intervention in order to improve the following deficits and impairments:  Pain,Postural dysfunction,Improper body mechanics,Difficulty walking,Decreased strength,Decreased range of motion  Visit Diagnosis: Chronic left-sided low back pain, unspecified whether sciatica  present  Foot drop, left  Difficulty in walking, not elsewhere classified     Problem List Patient Active Problem List   Diagnosis Date Noted  . Synovial cyst of lumbar facet joint 08/14/2019    Joneen Boers PT, DPT   12/13/2020, 12:38 PM  [facility] [address] [place], [address] Phone: [phone]   Fax:  [phone]  Name: Juan [name] MRN: [id number] Date of Birth: 1940/12/18

## 2020-12-15 ENCOUNTER — Other Ambulatory Visit: Payer: Self-pay

## 2020-12-15 ENCOUNTER — Ambulatory Visit: Payer: PPO

## 2020-12-15 DIAGNOSIS — M545 Low back pain, unspecified: Secondary | ICD-10-CM

## 2020-12-15 DIAGNOSIS — R262 Difficulty in walking, not elsewhere classified: Secondary | ICD-10-CM

## 2020-12-15 DIAGNOSIS — M21372 Foot drop, left foot: Secondary | ICD-10-CM

## 2020-12-15 DIAGNOSIS — G8929 Other chronic pain: Secondary | ICD-10-CM

## 2020-12-15 NOTE — Patient Instructions (Signed)
Seated hip extension isometrics   Sitting on a chair,    Squeeze your rear end muscles together and press your left foot onto the floor.     Hold for 5 seconds    Repeat 10 times   Perform 3 sets daily.      This is a corrective exercise. Once you no longer have symptoms, you can stop.   

## 2020-12-15 NOTE — Therapy (Signed)
[facility] [address], [place], [address] Phone: [phone]   Fax:  [phone]  Physical Therapy Treatment  Patient Details  Name: Juan [name] MRN: [id number] Date of Birth: 12/04/1940 Referring Provider (PT): Donn Pierini, MD   Encounter Date: 12/15/2020   PT End of Session - 12/15/20 0848    Visit Number 8    Number of Visits 25    Date for PT Re-Evaluation 02/09/21    Authorization Type 8    Authorization Time Period of 10    PT Start Time [phone]    PT Stop Time 0930    PT Time Calculation (min) 41 min    Activity Tolerance Patient tolerated treatment well    Behavior During Therapy [facility] for tasks assessed/performed           Past Medical History:  Diagnosis Date  . Arthritis    back- low  . Atrial fibrillation ([place])   . Cancer (HCC)    skin ( basal) cell , face, head, back, inside L ear  . Coronary artery disease   . Dysrhythmia   . MI (myocardial infarction) ([place])   . Sleep apnea    +Cpap in use nightly , last study- 10 yrs. ago  . 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 APPENDECTOMY    . BACK SURGERY  09/2011   lumbar fusion   . CARDIAC CATHETERIZATION    . CARDIAC SURGERY    . CARDIAC VALVE REPLACEMENT    . CORONARY ARTERY BYPASS GRAFT    . EYE SURGERY Bilateral    catarascts removed-   . HERNIA REPAIR Bilateral 1946, 1990's   inguinal x3 procedures   . LUMBAR LAMINECTOMY/DECOMPRESSION MICRODISCECTOMY Left 08/14/2019   Procedure: Laminectomy for facet/synovial cyst - left - Lumbar three-Lumbar four;  Surgeon: Earnie Larsson, MD;  Location: [facility];  Service: Neurosurgery;  Laterality: Left;  . TONSILLECTOMY      There were no vitals filed for this visit.   Subjective Assessment - 12/15/20 0850    Subjective L posterior hip is about a 2/10 currently. Felt pain when he bent over to put his socks on. Took Tylenol which  helped. L S/L to R S/L turning in bed did not bother him these past couple nights.    Pertinent History L foot drop. Pt had lumbar fusion November 2020 which alleviated low back pain. Pt however had L foot drop afterwards. Had PT but still has his foot drop. Pt has trouble walking and wobbles and cannot seem to walk in a straight line. L leg feels weaker than his R. Denies loss of bowel or bladder control. Had numbness R great toe (L4 dermatome) since the surgery. No L LE numbness. Denies saddle anesthesia. Currently has L low back pain which developed recently about 2 months, gradual onset. [place] 4-5x a week.    Patient Stated Goals Walk more stable and straight.    Currently in Pain? Yes    Pain Score 2                                      PT Education - 12/15/20 0853    Education Details ther-ex, HEP    Person(s) Educated Patient    Methods Explanation;Demonstration;Tactile cues;Verbal cues;Handout  Comprehension Returned demonstration;Verbalized understanding          Objective   Lumbar fusiont L4-L5 with interbody spacer. Has L3 posterior elements  No Blood pressure problems per pt.  No latex allergies  (-) L calf squeeze   L foot drop  Gait: decreased L heel strike. L foot drop.   Muscle tension B lumbar paraspinal muscles.    MedbridgeAccess Code [email or web address]  Therapeutic exercise  Seated hip extension isometrics  L 10x5 seconds for 3 sets  No L posterior hip pain afterwards. Just fatigue  Seated hip adduction ball and glute max squeeze 10x5 seconds for 3 sets  Supine posterior pelvic tilt with transversus abdominis contraction 10x5 seconds          no L posterior hip symptoms with exercise today.   Supine transversus abdominis contraction with hip fallouts 10x each LE   About 20 to 35 degrees secondary to difficulty with lumbopelvic control.   Difficulty with L > R.   Low back and pelvis shaking  observed. Eases with rest  Supine bridge 10x3  hooklying hip adduction ball and glute max squeeze 10x5 seconds for 3 sets   Supine L LE PNF D2 flexion with PT assist 10x3 to promote L ankle DF  Good muscle contraction for ankle DF palpated   Supine L leg extension in L hip flexion to 90 degrees position 10x3  Supine L ankle DF AAROM with PT 10x3 with 5 second holds             Tight gastroc muscle observed   Improved exercise technique, movement at target joints, use of target muscles aftermin tomod verbal, visual, tactile cues.   Response to treatment NoL low back and posterior hip pain after session.  Clinical impression Decreased L hip pain with treatment to improve L glute max activation. Continued working on core strength and control secondary to difficulty with lumbopelvic control. Tight L gastroc muscle observed which may play a factor in limited L ankle DF AROM. No L hip pain after session. Pt will benefit from continued skilled physical therapy services to decrease pain, improve strength and function.        PT Short Term Goals - 11/16/20 1101      PT SHORT TERM GOAL #1   Title Pt will be independent with his initial HEP to improve L ankle strength and AROM as well as decrease low back pain.    Baseline Pt has started his HEP. (11/16/2020)    Time 3    Period Weeks    Status New    Target Date 12/08/20             PT Long Term Goals - 11/16/20 1102      PT LONG TERM GOAL #1   Title Patient will improve L ankle AROM to 5 degrees or more to promote foot clearance during gait.    Baseline -17 degrees L ankle DF AROM (11/16/2020)    Time 12    Period Weeks    Status New    Target Date 02/09/21      PT LONG TERM GOAL #2   Title Pt will improve bilateral hip extension and abduction strength by at least 1/2 MMT grade to promote ability to perform tasks with less back pain.    Baseline Hip extension 4-/5 R and L, hip abduction 4/5 R  and L (11/16/2020)    Time 12    Period Weeks    Status New  Target Date 02/09/21      PT LONG TERM GOAL #3   Title Pt will improve his leg FOTO score by at least 10 points as a demonstration of improved function.    Baseline Leg FOTO 69 (11/16/2020)    Time 12    Period Weeks    Status New    Target Date 02/09/21      PT LONG TERM GOAL #4   Title Pt will improve L ankle DF strength by at least 1/2 MMT grade to promote ability to ambulate with less difficulty.    Baseline L ankle DF 3-/5 (11/16/2020)    Time 12    Period Weeks    Status New    Target Date 02/09/21                 Plan - 12/15/20 0853    Clinical Impression Statement Decreased L hip pain with treatment to improve L glute max activation. Continued working on core strength and control secondary to difficulty with lumbopelvic control. Tight L gastroc muscle observed which may play a factor in limited L ankle DF AROM. No L hip pain after session. Pt will benefit from continued skilled physical therapy services to decrease pain, improve strength and function.    Personal Factors and Comorbidities Age;Comorbidity 3+;Past/Current Experience;Time since onset of injury/illness/exacerbation    Comorbidities CA, arthritis, CAD, MI, CVA, low back surgery    Examination-Activity Limitations Dressing;Transfers;Bend;Lift;Locomotion Level    Stability/Clinical Decision Making Stable/Uncomplicated    Clinical Decision Making Low    Rehab Potential Fair    PT Frequency 2x / week    PT Duration 12 weeks    PT Treatment/Interventions Therapeutic activities;Therapeutic exercise;Functional mobility training;Balance training;Neuromuscular re-education;Patient/family education;Manual techniques;Dry needling;Aquatic Therapy;Electrical Stimulation;Iontophoresis 4mg /ml Dexamethasone    PT Next Visit Plan Hip ROM, hip, trunk, and ankle strengthening, manual techniques, modalities PRN    PT Home Exercise Plan Medbridge Access Code  [email or web address]    Consulted and Agree with Plan of Care Patient           Patient will benefit from skilled therapeutic intervention in order to improve the following deficits and impairments:  Pain,Postural dysfunction,Improper body mechanics,Difficulty walking,Decreased strength,Decreased range of motion  Visit Diagnosis: Chronic left-sided low back pain, unspecified whether sciatica present  Foot drop, left  Difficulty in walking, not elsewhere classified     Problem List Patient Active Problem List   Diagnosis Date Noted  . Synovial cyst of lumbar facet joint 08/14/2019    Joneen Boers PT, DPT   12/15/2020, 1:10 PM  [facility] [address], [place], [address] Phone: [phone]   Fax:  [phone]  Name: Juan [name] MRN: [id number] Date of Birth: 18-Feb-1941

## 2020-12-19 ENCOUNTER — Ambulatory Visit: Payer: PPO

## 2020-12-19 ENCOUNTER — Other Ambulatory Visit: Payer: Self-pay

## 2020-12-19 DIAGNOSIS — M21372 Foot drop, left foot: Secondary | ICD-10-CM

## 2020-12-19 DIAGNOSIS — M545 Low back pain, unspecified: Secondary | ICD-10-CM | POA: Diagnosis not present

## 2020-12-19 DIAGNOSIS — R262 Difficulty in walking, not elsewhere classified: Secondary | ICD-10-CM

## 2020-12-19 DIAGNOSIS — G8929 Other chronic pain: Secondary | ICD-10-CM

## 2020-12-19 NOTE — Patient Instructions (Addendum)
Pt was recommended to sleep on R side to help decrease R lateral shift posture and decrease L low back pain. Pt verbalized understanding     Access Code: [email or web address] URL: [email or web address] Date: 12/19/2020 Prepared by: Joneen Boers  Exercises Seated Transversus Abdominis Bracing - 3 x daily - 7 x weekly - 3 sets - 10 reps - 5 seconds hold Seated Hip Adduction Isometrics with Ball - 1 x daily - 7 x weekly - 3 sets - 10 reps - 5 seconds hold Seated Calf Stretch with Strap - 1 x daily - 7 x weekly - 3 sets - 10 reps - 5 secons hold Bent Knee Fallouts - 1 x daily - 7 x weekly - 1-3 sets - 5-10 reps Standing Bilateral Gastroc Stretch with Step - 3 x daily - 7 x weekly - 1 sets - 10 reps - 10 seconds hold Clamshell - 1 x daily - 7 x weekly - 3 sets - 10 reps

## 2020-12-19 NOTE — Therapy (Signed)
[facility] [address], [place], [address] Phone: [phone]   Fax:  [phone]  Physical Therapy Treatment  Patient Details  Name: Juan [name] MRN: [id number] Date of Birth: [birth year] Referring Provider (PT): Donn Pierini, MD   Encounter Date: 12/19/2020   PT End of Session - 12/19/20 0933    Visit Number 9    Number of Visits 25    Date for PT Re-Evaluation 02/09/21    Authorization Type 9    Authorization Time Period of 10    PT Start Time 0933    PT Stop Time 1015    PT Time Calculation (min) 42 min    Activity Tolerance Patient tolerated treatment well    Behavior During Therapy [facility] for tasks assessed/performed           Past Medical History:  Diagnosis Date  . Arthritis    back- low  . Atrial fibrillation ([place])   . Cancer (HCC)    skin ( basal) cell , face, head, back, inside L ear  . Coronary artery disease   . Dysrhythmia   . MI (myocardial infarction) ([place])   . Sleep apnea    +Cpap in use nightly , last study- 10 yrs. ago  . 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 APPENDECTOMY    . BACK SURGERY  09/2011   lumbar fusion   . CARDIAC CATHETERIZATION    . CARDIAC SURGERY    . CARDIAC VALVE REPLACEMENT    . CORONARY ARTERY BYPASS GRAFT    . EYE SURGERY Bilateral    catarascts removed-   . HERNIA REPAIR Bilateral 1946, 1990's   inguinal x3 procedures   . LUMBAR LAMINECTOMY/DECOMPRESSION MICRODISCECTOMY Left 08/14/2019   Procedure: Laminectomy for facet/synovial cyst - left - Lumbar three-Lumbar four;  Surgeon: Earnie Larsson, MD;  Location: [facility];  Service: Neurosurgery;  Laterality: Left;  . TONSILLECTOMY      There were no vitals filed for this visit.   Subjective Assessment - 12/19/20 0934    Subjective Friday night, slept on the couch for a few hours due to L posterior hip and low back pain (pt started laying on his  L side in bed, layed down on his R side on the couch which helped). Took a couple of Tylenols this morning. L LE seemed weak this morning earlier. Better now. 2/10 L posterior hip/low back pain currently. Was ok Thursday after last session.    Pertinent History L foot drop. Pt had lumbar fusion November 2020 which alleviated low back pain. Pt however had L foot drop afterwards. Had PT but still has his foot drop. Pt has trouble walking and wobbles and cannot seem to walk in a straight line. L leg feels weaker than his R. Denies loss of bowel or bladder control. Had numbness R great toe (L4 dermatome) since the surgery. No L LE numbness. Denies saddle anesthesia. Currently has L low back pain which developed recently about 2 months, gradual onset. [place]-5x a week.    Patient Stated Goals Walk more stable and straight.    Currently in Pain? Yes    Pain Score 2  PT Education - 12/19/20 0946    Education Details ther-ex, HEP    Person(s) Educated Patient    Methods Explanation;Demonstration;Tactile cues;Verbal cues;Handout    Comprehension Returned demonstration;Verbalized understanding           Objective   Lumbar fusiont L4-L5 with interbody spacer. Has L3 posterior elements  No Blood pressure problems per pt.  No latex allergies  (-) L calf squeeze   L foot drop  Gait: decreased L heel strike. L foot drop.   Muscle tension B lumbar paraspinal muscles.    MedbridgeAccess Code [email or web address]  Therapeutic exercise  Standing ankle DF/PF on rocker board with B UE assist for 2 minutes  Standing small R lateral shift correction 10x5 for 3 sets  Slight decrease in L low back symptoms     R S/L L hip abduction 10x. L thigh bone feeling sensation, eases with rest.   R S/L clamshell   L 10x3   No L low back pain after R S/L exercises to decrease R lateral shift posture.     Supine open  books 10x5 seconds for 3 sets to promote thoracic extension and decrease low back extension stress.   Supine L LE PNF D2 flexion with PT assist 10x3 to promote L ankle DF  Good muscle contraction for ankle DF palpated  L ankle DF AROM: -22 degrees in supine  Supine L ankle DF AAROM with PT 10x3 with 5 second holds  L ankle DF AROM -19 degrees afterwards.    Improved exercise technique, movement at target joints, use of target muscles aftermin tomod verbal, visual, tactile cues.   Response to treatment Decreased L low back pain  Clinical impression Decreased L low back pain with treatment to gently decrease R lateral shift posture. Decreased starting L ankle DF AROM compared to initial evaluation but improved after Tibialis anterior strengthening. No L low back pain after session. Pt will benefit from continued skilled physical therapy services to decrease pain, improve strength, function, and ability to ambulate with less difficulty.         PT Short Term Goals - 11/16/20 1101      PT SHORT TERM GOAL #1   Title Pt will be independent with his initial HEP to improve L ankle strength and AROM as well as decrease low back pain.    Baseline Pt has started his HEP. (11/16/2020)    Time 3    Period Weeks    Status New    Target Date 12/08/20             PT Long Term Goals - 11/16/20 1102      PT LONG TERM GOAL #1   Title Patient will improve L ankle AROM to 5 degrees or more to promote foot clearance during gait.    Baseline -17 degrees L ankle DF AROM (11/16/2020)    Time 12    Period Weeks    Status New    Target Date 02/09/21      PT LONG TERM GOAL #2   Title Pt will improve bilateral hip extension and abduction strength by at least 1/2 MMT grade to promote ability to perform tasks with less back pain.    Baseline Hip extension 4-/5 R and L, hip abduction 4/5 R and L (11/16/2020)    Time 12    Period Weeks    Status New    Target Date  02/09/21      PT LONG TERM GOAL #3  Title Pt will improve his leg FOTO score by at least 10 points as a demonstration of improved function.    Baseline Leg FOTO 69 (11/16/2020)    Time 12    Period Weeks    Status New    Target Date 02/09/21      PT LONG TERM GOAL #4   Title Pt will improve L ankle DF strength by at least 1/2 MMT grade to promote ability to ambulate with less difficulty.    Baseline L ankle DF 3-/5 (11/16/2020)    Time 12    Period Weeks    Status New    Target Date 02/09/21                 Plan - 12/19/20 0958    Clinical Impression Statement Decreased L low back pain with treatment to gently decrease R lateral shift posture. Decreased starting L ankle DF AROM compared to initial evaluation but improved after Tibialis anterior strengthening. No L low back pain after session. Pt will benefit from continued skilled physical therapy services to decrease pain, improve strength, function, and ability to ambulate with less difficulty.    Personal Factors and Comorbidities Age;Comorbidity 3+;Past/Current Experience;Time since onset of injury/illness/exacerbation    Comorbidities CA, arthritis, CAD, MI, CVA, low back surgery    Examination-Activity Limitations Dressing;Transfers;Bend;Lift;Locomotion Level    Stability/Clinical Decision Making Stable/Uncomplicated    Rehab Potential Fair    PT Frequency 2x / week    PT Duration 2 weeks    PT Treatment/Interventions Therapeutic activities;Therapeutic exercise;Functional mobility training;Balance training;Neuromuscular re-education;Patient/family education;Manual techniques;Dry needling;Aquatic Therapy;Electrical Stimulation;Iontophoresis 4mg /ml Dexamethasone    PT Next Visit Plan Hip ROM, hip, trunk, and ankle strengthening, manual techniques, modalities PRN    PT Home Exercise Plan Medbridge Access Code [email or web address]    Consulted and Agree with Plan of Care Patient           Patient will benefit from skilled  therapeutic intervention in order to improve the following deficits and impairments:  Pain,Postural dysfunction,Improper body mechanics,Difficulty walking,Decreased strength,Decreased range of motion  Visit Diagnosis: Chronic left-sided low back pain, unspecified whether sciatica present  Foot drop, left  Difficulty in walking, not elsewhere classified     Problem List Patient Active Problem List   Diagnosis Date Noted  . Synovial cyst of lumbar facet joint 08/14/2019    Joneen Boers PT, DPT  12/19/2020, 10:24 AM  [facility] [address], [place], [address] Phone: [phone]   Fax:  [phone]  Name: Juan [name] MRN: [id number] Date of Birth: 01/12/1941

## 2020-12-21 DIAGNOSIS — H6123 Impacted cerumen, bilateral: Secondary | ICD-10-CM | POA: Diagnosis not present

## 2020-12-21 DIAGNOSIS — H903 Sensorineural hearing loss, bilateral: Secondary | ICD-10-CM | POA: Diagnosis not present

## 2020-12-22 ENCOUNTER — Ambulatory Visit: Payer: PPO

## 2020-12-22 ENCOUNTER — Other Ambulatory Visit: Payer: Self-pay

## 2020-12-22 DIAGNOSIS — R262 Difficulty in walking, not elsewhere classified: Secondary | ICD-10-CM

## 2020-12-22 DIAGNOSIS — M21372 Foot drop, left foot: Secondary | ICD-10-CM

## 2020-12-22 DIAGNOSIS — M545 Low back pain, unspecified: Secondary | ICD-10-CM | POA: Diagnosis not present

## 2020-12-22 DIAGNOSIS — G8929 Other chronic pain: Secondary | ICD-10-CM

## 2020-12-22 NOTE — Therapy (Signed)
[facility] [address], [place], [address] Phone: [phone]   Fax:  [phone]  Physical Therapy Treatment And Progress Report (11/16/2020 - 12/22/2020)  Patient Details  Name: Juan [name] MRN: [id number] Date of Birth: 08-15-1941 Referring Provider (PT): Jennings Books, MD   Encounter Date: 12/22/2020   PT End of Session - 12/22/20 0934    Visit Number 10    Number of Visits 25    Date for PT Re-Evaluation 02/09/21    Authorization Type 10    Authorization Time Period of 10    PT Start Time 0935    PT Stop Time 1018    PT Time Calculation (min) 43 min    Activity Tolerance Patient tolerated treatment well    Behavior During Therapy [facility] for tasks assessed/performed           Past Medical History:  Diagnosis Date  . Arthritis    back- low  . Atrial fibrillation ([place])   . Cancer (HCC)    skin ( basal) cell , face, head, back, inside L ear  . Coronary artery disease   . Dysrhythmia   . MI (myocardial infarction) ([place])   . Sleep apnea    +Cpap in use nightly , last study- 10 yrs. ago  . 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 APPENDECTOMY    . BACK SURGERY  09/2011   lumbar fusion   . CARDIAC CATHETERIZATION    . CARDIAC SURGERY    . CARDIAC VALVE REPLACEMENT    . CORONARY ARTERY BYPASS GRAFT    . EYE SURGERY Bilateral    catarascts removed-   . HERNIA REPAIR Bilateral 1946, 1990's   inguinal x3 procedures   . LUMBAR LAMINECTOMY/DECOMPRESSION MICRODISCECTOMY Left 08/14/2019   Procedure: Laminectomy for facet/synovial cyst - left - Lumbar three-Lumbar four;  Surgeon: Earnie Larsson, MD;  Location: [facility];  Service: Neurosurgery;  Laterality: Left;  . TONSILLECTOMY      There were no vitals filed for this visit.   Subjective Assessment - 12/22/20 0936    Subjective Back is doing good. Mowed the grass yesterday using the riding  mower, used a back belt and did well.  5/10 L low back pain at most for the past 7 days.  Not a lot of movement in L foot.    Pertinent History L foot drop. Pt had lumbar fusion November 2020 which alleviated low back pain. Pt however had L foot drop afterwards. Had PT but still has his foot drop. Pt has trouble walking and wobbles and cannot seem to walk in a straight line. L leg feels weaker than his R. Denies loss of bowel or bladder control. Had numbness R great toe (L4 dermatome) since the surgery. No L LE numbness. Denies saddle anesthesia. Currently has L low back pain which developed recently about 2 months, gradual onset. [place]5x a week.    Patient Stated Goals Walk more stable and straight.    Currently in Pain? No/denies    Pain Score 0-No pain    Pain Location Back              [facility] PT Assessment - 12/22/20 0938      Assessment   Referring Provider (PT) Jennings Books, MD      Strength   Right Hip Extension 4+/5   seated manually resisted  Right Hip ABduction 4/5    Left Hip Extension 4+/5   seated manually resisted; with L low back pain   Left Hip ABduction 4/5                                 PT Education - 12/22/20 0947    Education Details ther-ex    Person(s) Educated Patient    Methods Explanation;Demonstration;Tactile cues;Verbal cues;Handout    Comprehension Returned demonstration;Verbalized understanding           Objective   Lumbar fusiont L4-L5 with interbody spacer. Has L3 posterior elements  No Blood pressure problems per pt.  No latex allergies  (-) L calf squeeze   L foot drop  Gait: decreased L heel strike. L foot drop.   Muscle tension B lumbar paraspinal muscles.    MedbridgeAccess Code [email or web address]  Therapeutic exercise  Seated manually resisted hip extension, S/L hip abduction   Reviewed progress with hip strength   S/L hip abduction   L 10x3 (difficulty on third set)  R  10x3  Supine open books 10x5 seconds for 3 sets to promote thoracic extension and decrease low back extension stress.    Supine L LE PNF D2 flexion with PT assist 10x3 to promote L ankle DF  Good muscle contraction for ankle DF palpated   Supine L ankle DF AAROM with PT 10x3 with 5 second holds  L ankle DF AROM -15 degrees afterwards.   Standing small R lateral shift correction 10x5 seconds. Slight increase in symptoms, returns to baseline with rest.    Seated transversus abdominis contraction 10x2 with 5 second holds    Improved exercise technique, movement at target joints, use of target muscles aftermin tomod verbal, visual, tactile cues.   Response to treatment Pt tolerated session well without aggravation of symptoms.   Clinical impression Pt demonstrates improved glute max strength, overall decreased low back pain to 5/10 compared to 8/10 at worst, slight improvement in ankle DF, and improved function (improved FOTO score) since initial evaluation. Pt still demonstrates hip weakness, back pain, decreased ankle ROM, and difficulty with functional tasks and would benefit from continued skilled physical therapy services to address the aforementioned deficits.       PT Short Term Goals - 12/22/20 0953      PT SHORT TERM GOAL #1   Title Pt will be independent with his initial HEP to improve L ankle strength and AROM as well as decrease low back pain.    Baseline Pt has started his HEP. (11/16/2020); No questions with HEP, able to perform them regularly (12/22/2020)    Time 3    Period Weeks    Status Achieved    Target Date 12/08/20             PT Long Term Goals - 12/22/20 0954      PT LONG TERM GOAL #1   Title Patient will improve L ankle AROM to 5 degrees or more to promote foot clearance during gait.    Baseline -17 degrees L ankle DF AROM (11/16/2020); -15 degrees L ankle DF AROM (12/22/2020)    Time 12    Period Weeks    Status  Partially Met    Target Date 02/09/21      PT LONG TERM GOAL #2   Title Pt will improve bilateral hip extension and abduction strength by at least 1/2 MMT grade to promote ability to  perform tasks with less back pain.    Baseline Hip extension 4-/5 R and L, hip abduction 4/5 R and L (11/16/2020); Hip extension 4+/5 R and L, hip abduction 4/5 R and L (12/22/2020)    Time 12    Period Weeks    Status Partially Met    Target Date 02/09/21      PT LONG TERM GOAL #3   Title Pt will improve his leg FOTO score by at least 10 points as a demonstration of improved function.    Baseline Leg FOTO 69 (11/16/2020); 73 (12/22/2020)    Time 12    Period Weeks    Status Partially Met    Target Date 02/09/21      PT LONG TERM GOAL #4   Title Pt will improve L ankle DF strength by at least 1/2 MMT grade to promote ability to ambulate with less difficulty.    Baseline L ankle DF 3-/5 (11/16/2020), (12/22/2020)    Time 12    Period Weeks    Status On-going    Target Date 02/09/21      PT LONG TERM GOAL #5   Title Pt will have a decrease in low back pain to 3/10 or less at worst to promote ability to perform functional tasks with less difficutly.    Baseline 8/10 at most for the past month (11/16/2020), 5/10 at most for the past 7 days ( 12/22/2020)    Time 7    Status New    Target Date 02/09/21                 Plan - 12/22/20 0950    Clinical Impression Statement Pt demonstrates improved glute max strength, overall decreased low back pain to 5/10 compared to 8/10 at worst, slight improvement in ankle DF, and improved function (improved FOTO score) since initial evaluation. Pt still demonstrates hip weakness, back pain, decreased ankle ROM, and difficulty with functional tasks and would benefit from continued skilled physical therapy services to address the aforementioned deficits.    Personal Factors and Comorbidities Age;Comorbidity 3+;Past/Current Experience;Time since onset of  injury/illness/exacerbation    Comorbidities CA, arthritis, CAD, MI, CVA, low back surgery    Examination-Activity Limitations Dressing;Transfers;Bend;Lift;Locomotion Level    Stability/Clinical Decision Making Stable/Uncomplicated    Clinical Decision Making Low    Rehab Potential Fair    PT Frequency 2x / week    PT Duration Other (comment)   7 weeks   PT Treatment/Interventions Therapeutic activities;Therapeutic exercise;Functional mobility training;Balance training;Neuromuscular re-education;Patient/family education;Manual techniques;Dry needling;Aquatic Therapy;Electrical Stimulation;Iontophoresis 55m/ml Dexamethasone    PT Next Visit Plan Hip ROM, hip, trunk, and ankle strengthening, manual techniques, modalities PRN    PT Home Exercise Plan Medbridge Access Code [email or web address]    Consulted and Agree with Plan of Care Patient           Patient will benefit from skilled therapeutic intervention in order to improve the following deficits and impairments:  Pain,Postural dysfunction,Improper body mechanics,Difficulty walking,Decreased strength,Decreased range of motion  Visit Diagnosis: Chronic left-sided low back pain, unspecified whether sciatica present - Plan: PT plan of care cert/re-cert  Foot drop, left - Plan: PT plan of care cert/re-cert  Difficulty in walking, not elsewhere classified - Plan: PT plan of care cert/re-cert  Chronic bilateral low back pain, unspecified whether sciatica present - Plan: PT plan of care cert/re-cert     Problem List Patient Active Problem List   Diagnosis Date Noted  . Synovial cyst of lumbar facet joint  08/14/2019    Joneen Boers PT, DPT   12/22/2020, 12:44 PM  [facility] [address] [place], [address] Phone: [phone]   Fax:  [phone]  Name: Juan [name] MRN: [id number] Date of Birth: 1941/04/30

## 2020-12-26 ENCOUNTER — Ambulatory Visit: Payer: PPO | Attending: Neurology

## 2020-12-26 DIAGNOSIS — G8929 Other chronic pain: Secondary | ICD-10-CM | POA: Diagnosis not present

## 2020-12-26 DIAGNOSIS — M21372 Foot drop, left foot: Secondary | ICD-10-CM | POA: Diagnosis not present

## 2020-12-26 DIAGNOSIS — M545 Low back pain, unspecified: Secondary | ICD-10-CM | POA: Diagnosis not present

## 2020-12-26 DIAGNOSIS — R262 Difficulty in walking, not elsewhere classified: Secondary | ICD-10-CM | POA: Insufficient documentation

## 2020-12-26 NOTE — Therapy (Signed)
[facility] [address], [place], [address] Phone: [phone]   Fax:  [phone]  Physical Therapy Treatment  Patient Details  Name: Juan [name] MRN: [id number] Date of Birth: [birth year] Referring Provider (PT): Jennings Books, MD   Encounter Date: 12/26/2020   PT End of Session - 12/26/20 1105    Visit Number 11    Number of Visits 25    Date for PT Re-Evaluation 02/09/21    Authorization Type 1    Authorization Time Period of 10    PT Start Time 1105    PT Stop Time 1144    PT Time Calculation (min) 39 min    Activity Tolerance Patient tolerated treatment well    Behavior During Therapy [facility] for tasks assessed/performed           Past Medical History:  Diagnosis Date  . Arthritis    back- low  . Atrial fibrillation ([place])   . Cancer (HCC)    skin ( basal) cell , face, head, back, inside L ear  . Coronary artery disease   . Dysrhythmia   . MI (myocardial infarction) ([place])   . Sleep apnea    +Cpap in use nightly , last study- 10 yrs. ago  . 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 APPENDECTOMY    . BACK SURGERY  09/2011   lumbar fusion   . CARDIAC CATHETERIZATION    . CARDIAC SURGERY    . CARDIAC VALVE REPLACEMENT    . CORONARY ARTERY BYPASS GRAFT    . EYE SURGERY Bilateral    catarascts removed-   . HERNIA REPAIR Bilateral 1946, 1990's   inguinal x3 procedures   . LUMBAR LAMINECTOMY/DECOMPRESSION MICRODISCECTOMY Left 08/14/2019   Procedure: Laminectomy for facet/synovial cyst - left - Lumbar three-Lumbar four;  Surgeon: Earnie Larsson, MD;  Location: [facility];  Service: Neurosurgery;  Laterality: Left;  . TONSILLECTOMY      There were no vitals filed for this visit.   Subjective Assessment - 12/26/20 1105    Subjective Back is ok. No pain currently. Walked 4 laps at [employer] earlier. Had pain on and off during the weekend down the L  leg (L5 dermatome, not past the knee). Did ok after last session.    Pertinent History L foot drop. Pt had lumbar fusion November 2020 which alleviated low back pain. Pt however had L foot drop afterwards. Had PT but still has his foot drop. Pt has trouble walking and wobbles and cannot seem to walk in a straight line. L leg feels weaker than his R. Denies loss of bowel or bladder control. Had numbness R great toe (L4 dermatome) since the surgery. No L LE numbness. Denies saddle anesthesia. Currently has L low back pain which developed recently about 2 months, gradual onset. [place] 4-5x a week.    Patient Stated Goals Walk more stable and straight.    Currently in Pain? No/denies    Pain Score 0-No pain                                     PT Education - 12/26/20 1120    Education Details ther-ex    Person(s) Educated Patient    Methods Explanation;Demonstration;Tactile cues;Verbal cues    Comprehension Returned demonstration;Verbalized understanding  Objective   Lumbar fusiont L4-L5 with interbody spacer. Has L3 posterior elements  No Blood pressure problems per pt.  No latex allergies  (-) L calf squeeze   L foot drop  Gait: decreased L heel strike. L foot drop.   Muscle tension B lumbar paraspinal muscles.    MedbridgeAccess Code [email or web address]  Therapeutic exercise  Standing small R lateral shift correction 10x5 seconds.. Slight increase in symptoms, returns to baseline with rest.   Seated transversus abdominis and glute max contraction 10x2 with 5 second holds   S/L hip abduction              L 10x3             R 10x3  Supine open books 10x5 seconds for 3 sets to promote thoracic extension and decrease low back extension stress.  Supine L ankle DF AAROM with PT 10x3 with 5 second holds    Supine L LE PNF D2 flexion with PT assist 10x3 to promote L ankle DF   Mini bridge  10x2  Improved exercise technique, movement at target joints, use of target muscles aftermin tomod verbal, visual, tactile cues.    Manual therapy  (-) calf squeeze L Supine STM L lateral gastroc muscle to decrease tension and fascial restrictions  Response to treatment Pt tolerated session well without aggravation of symptoms.   Clinical impression Continued working on decreasing calf muscle tightness as well as promoting tibialis anterior muscle activation to promote L ankle DF.  Continued working on improving thoracic extension as well as trunk and glute strength to help decrease stress to low back. Pt tolerated session well without aggravation of symptoms. Pt will benefit from continued skilled physical therapy services to decrease pain, improve strength and function.        PT Short Term Goals - 12/22/20 0953      PT SHORT TERM GOAL #1   Title Pt will be independent with his initial HEP to improve L ankle strength and AROM as well as decrease low back pain.    Baseline Pt has started his HEP. (11/16/2020); No questions with HEP, able to perform them regularly (12/22/2020)    Time 3    Period Weeks    Status Achieved    Target Date 12/08/20             PT Long Term Goals - 12/22/20 0954      PT LONG TERM GOAL #1   Title Patient will improve L ankle AROM to 5 degrees or more to promote foot clearance during gait.    Baseline -17 degrees L ankle DF AROM (11/16/2020); -15 degrees L ankle DF AROM (12/22/2020)    Time 12    Period Weeks    Status Partially Met    Target Date 02/09/21      PT LONG TERM GOAL #2   Title Pt will improve bilateral hip extension and abduction strength by at least 1/2 MMT grade to promote ability to perform tasks with less back pain.    Baseline Hip extension 4-/5 R and L, hip abduction 4/5 R and L (11/16/2020); Hip extension 4+/5 R and L, hip abduction 4/5 R and L (12/22/2020)    Time 12    Period Weeks    Status Partially Met     Target Date 02/09/21      PT LONG TERM GOAL #3   Title Pt will improve his leg FOTO score by at least 10 points as a  demonstration of improved function.    Baseline Leg FOTO 69 (11/16/2020); 73 (12/22/2020)    Time 12    Period Weeks    Status Partially Met    Target Date 02/09/21      PT LONG TERM GOAL #4   Title Pt will improve L ankle DF strength by at least 1/2 MMT grade to promote ability to ambulate with less difficulty.    Baseline L ankle DF 3-/5 (11/16/2020), (12/22/2020)    Time 12    Period Weeks    Status On-going    Target Date 02/09/21      PT LONG TERM GOAL #5   Title Pt will have a decrease in low back pain to 3/10 or less at worst to promote ability to perform functional tasks with less difficutly.    Baseline 8/10 at most for the past month (11/16/2020), 5/10 at most for the past 7 days ( 12/22/2020)    Time 7    Status New    Target Date 02/09/21                 Plan - 12/26/20 1121    Clinical Impression Statement Continued working on decreasing calf muscle tightness as well as promoting tibialis anterior muscle activation to promote L ankle DF.  Continued working on improving thoracic extension as well as trunk and glute strength to help decrease stress to low back. Pt tolerated session well without aggravation of symptoms. Pt will benefit from continued skilled physical therapy services to decrease pain, improve strength and function.    Personal Factors and Comorbidities Age;Comorbidity 3+;Past/Current Experience;Time since onset of injury/illness/exacerbation    Comorbidities CA, arthritis, CAD, MI, CVA, low back surgery    Examination-Activity Limitations Dressing;Transfers;Bend;Lift;Locomotion Level    Stability/Clinical Decision Making Stable/Uncomplicated    Clinical Decision Making Low    Rehab Potential Fair    PT Frequency 2x / week    PT Duration Other (comment)   7 weeks   PT Treatment/Interventions Therapeutic activities;Therapeutic  exercise;Functional mobility training;Balance training;Neuromuscular re-education;Patient/family education;Manual techniques;Dry needling;Aquatic Therapy;Electrical Stimulation;Iontophoresis 80m/ml Dexamethasone    PT Next Visit Plan Hip ROM, hip, trunk, and ankle strengthening, manual techniques, modalities PRN    PT Home Exercise Plan Medbridge Access Code [email or web address]    Consulted and Agree with Plan of Care Patient           Patient will benefit from skilled therapeutic intervention in order to improve the following deficits and impairments:  Pain,Postural dysfunction,Improper body mechanics,Difficulty walking,Decreased strength,Decreased range of motion  Visit Diagnosis: Chronic left-sided low back pain, unspecified whether sciatica present  Foot drop, left  Difficulty in walking, not elsewhere classified     Problem List Patient Active Problem List   Diagnosis Date Noted  . Synovial cyst of lumbar facet joint 08/14/2019    MJoneen BoersPT, DPT   12/26/2020, 1:34 PM  [facility] [address] [place] [address]Phone: [phone]  Fax:  [phone] Name: ROwen PratteMRN: [id number] of Birth: 4May 31, 1942

## 2020-12-28 ENCOUNTER — Other Ambulatory Visit: Payer: Self-pay

## 2020-12-28 ENCOUNTER — Ambulatory Visit: Payer: PPO

## 2020-12-28 DIAGNOSIS — M545 Low back pain, unspecified: Secondary | ICD-10-CM | POA: Diagnosis not present

## 2020-12-28 DIAGNOSIS — R262 Difficulty in walking, not elsewhere classified: Secondary | ICD-10-CM

## 2020-12-28 DIAGNOSIS — M21372 Foot drop, left foot: Secondary | ICD-10-CM

## 2020-12-28 DIAGNOSIS — G8929 Other chronic pain: Secondary | ICD-10-CM

## 2020-12-28 NOTE — Therapy (Signed)
[place] [facility] [address] [place], [address] Phone: [phone]   Fax:  [phone]  Physical Therapy Treatment  Patient Details  Name: Juan [name] MRN: [id number] Date of Birth: Jan 12, 1941 Referring Provider (PT): Jennings Books, MD   Encounter Date: 12/28/2020   PT End of Session - 12/28/20 1104    Visit Number 12    Number of Visits 25    Date for PT Re-Evaluation 02/09/21    Authorization Type 2    Authorization Time Period of 10    PT Start Time 1104    PT Stop Time 1146    PT Time Calculation (min) 42 min    Activity Tolerance Patient tolerated treatment well    Behavior During Therapy [facility] for tasks assessed/performed           Past Medical History:  Diagnosis Date  . Arthritis    back- low  . Atrial fibrillation ([place])   . Cancer (HCC)    skin ( basal) cell , face, head, back, inside L ear  . Coronary artery disease   . Dysrhythmia   . MI (myocardial infarction) ([place])   . Sleep apnea    +Cpap in use nightly , last study- 10 yrs. ago  . 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 APPENDECTOMY    . BACK SURGERY  09/2011   lumbar fusion   . CARDIAC CATHETERIZATION    . CARDIAC SURGERY    . CARDIAC VALVE REPLACEMENT    . CORONARY ARTERY BYPASS GRAFT    . EYE SURGERY Bilateral    catarascts removed-   . HERNIA REPAIR Bilateral 1946, 1990's   inguinal x3 procedures   . LUMBAR LAMINECTOMY/DECOMPRESSION MICRODISCECTOMY Left 08/14/2019   Procedure: Laminectomy for facet/synovial cyst - left - Lumbar three-Lumbar four;  Surgeon: Earnie Larsson, MD;  Location: [place];  Service: Neurosurgery;  Laterality: Left;  . TONSILLECTOMY      There were no vitals filed for this visit.   Subjective Assessment - 12/28/20 1105    Subjective Back is good, no pain. Was good after last session.    Pertinent History L foot drop. Pt had lumbar fusion November  2020 which alleviated low back pain. Pt however had L foot drop afterwards. Had PT but still has his foot drop. Pt has trouble walking and wobbles and cannot seem to walk in a straight line. L leg feels weaker than his R. Denies loss of bowel or bladder control. Had numbness R great toe (L4 dermatome) since the surgery. No L LE numbness. Denies saddle anesthesia. Currently has L low back pain which developed recently about 2 months, gradual onset. [place] 4-5x a week.    Patient Stated Goals Walk more stable and straight.    Currently in Pain? No/denies    Pain Score 0-No pain                                     PT Education - 12/28/20 1107    Education Details ther-ex    Person(s) Educated Patient    Methods Explanation;Demonstration;Tactile cues;Verbal cues    Comprehension Returned demonstration;Verbalized understanding             Objective   Lumbar fusiont L4-L5 with interbody spacer. Has L3 posterior elements  No Blood pressure problems per pt.  No latex allergies  (-) L calf squeeze   L foot drop  Gait: decreased L heel strike. L foot drop.   Muscle tension B lumbar paraspinal muscles.    MedbridgeAccess Code [email or web address]  Therapeutic exercise  hooklying posterior pelvic tilt 10x5 seconds for 3 sets  Mini bridge 10x3  Supine open books 10x5 seconds for 3 sets to promote thoracic extension and decrease low back extension stress.  hooklying mini crunch 10x3  Supine L LE PNF D2 flexion with PT assist 10x3 to promote L ankle DF     Supine L ankle DF AAROM with PT 10x3 with 5 second holds  L ankle DF AROM: -10  S/L hip abduction  L 10x3   Improved exercise technique, movement at target joints, use of target muscles aftermin tomod verbal, visual, tactile cues.    Manual therapy (-) calf squeeze L Supine STM L lateral gastroc muscle to decrease  tension and fascial restrictions  Response to treatment Pt tolerated session well without aggravation of symptoms.  Clinical impression Continued working on improving trunk and glute strength as well as thoracic extension to decrease stress to low back during closed chain tasks. Continued working on decreasing L gastroc muscle tension and improving tibialis muscle activation. Improved L ankle DF to -10 degrees today. Pt tolerated session well without aggravation of symptoms. Pt will benefit from continued skilled physical therapy services to decrease pain, improve strength and function.      PT Short Term Goals - 12/22/20 0953      PT SHORT TERM GOAL #1   Title Pt will be independent with his initial HEP to improve L ankle strength and AROM as well as decrease low back pain.    Baseline Pt has started his HEP. (11/16/2020); No questions with HEP, able to perform them regularly (12/22/2020)    Time 3    Period Weeks    Status Achieved    Target Date 12/08/20             PT Long Term Goals - 12/22/20 0954      PT LONG TERM GOAL #1   Title Patient will improve L ankle AROM to 5 degrees or more to promote foot clearance during gait.    Baseline -17 degrees L ankle DF AROM (11/16/2020); -15 degrees L ankle DF AROM (12/22/2020)    Time 12    Period Weeks    Status Partially Met    Target Date 02/09/21      PT LONG TERM GOAL #2   Title Pt will improve bilateral hip extension and abduction strength by at least 1/2 MMT grade to promote ability to perform tasks with less back pain.    Baseline Hip extension 4-/5 R and L, hip abduction 4/5 R and L (11/16/2020); Hip extension 4+/5 R and L, hip abduction 4/5 R and L (12/22/2020)    Time 12    Period Weeks    Status Partially Met    Target Date 02/09/21      PT LONG TERM GOAL #3   Title Pt will improve his leg FOTO score by at least 10 points as a demonstration of improved function.    Baseline Leg FOTO 69 (11/16/2020); 73 (12/22/2020)     Time 12    Period Weeks    Status Partially Met    Target Date 02/09/21      PT LONG TERM GOAL #4   Title Pt will improve  L ankle DF strength by at least 1/2 MMT grade to promote ability to ambulate with less difficulty.    Baseline L ankle DF 3-/5 (11/16/2020), (12/22/2020)    Time 12    Period Weeks    Status On-going    Target Date 02/09/21      PT LONG TERM GOAL #5   Title Pt will have a decrease in low back pain to 3/10 or less at worst to promote ability to perform functional tasks with less difficutly.    Baseline 8/10 at most for the past month (11/16/2020), 5/10 at most for the past 7 days ( 12/22/2020)    Time 7    Status New    Target Date 02/09/21                 Plan - 12/28/20 1108    Clinical Impression Statement Continued working on improving trunk and glute strength as well as thoracic extension to decrease stress to low back during closed chain tasks. Continued working on decreasing L gastroc muscle tension and improving tibialis muscle activation. Improved L ankle DF to -10 degrees today. Pt tolerated session well without aggravation of symptoms. Pt will benefit from continued skilled physical therapy services to decrease pain, improve strength and function.    Personal Factors and Comorbidities Age;Comorbidity 3+;Past/Current Experience;Time since onset of injury/illness/exacerbation    Comorbidities CA, arthritis, CAD, MI, CVA, low back surgery    Examination-Activity Limitations Dressing;Transfers;Bend;Lift;Locomotion Level    Stability/Clinical Decision Making Stable/Uncomplicated    Clinical Decision Making Low    Rehab Potential Fair    PT Frequency 2x / week    PT Duration Other (comment)   7 weeks   PT Treatment/Interventions Therapeutic activities;Therapeutic exercise;Functional mobility training;Balance training;Neuromuscular re-education;Patient/family education;Manual techniques;Dry needling;Aquatic Therapy;Electrical Stimulation;Iontophoresis 7m/ml  Dexamethasone    PT Next Visit Plan Hip ROM, hip, trunk, and ankle strengthening, manual techniques, modalities PRN    PT Home Exercise Plan Medbridge Access Code [email or web address]    Consulted and Agree with Plan of Care Patient           Patient will benefit from skilled therapeutic intervention in order to improve the following deficits and impairments:  Pain,Postural dysfunction,Improper body mechanics,Difficulty walking,Decreased strength,Decreased range of motion  Visit Diagnosis: Chronic left-sided low back pain, unspecified whether sciatica present  Foot drop, left  Difficulty in walking, not elsewhere classified  Chronic bilateral low back pain, unspecified whether sciatica present     Problem List Patient Active Problem List   Diagnosis Date Noted  . Synovial cyst of lumbar facet joint 08/14/2019    MJoneen BoersPT, DPT   12/28/2020, 12:59 PM  [facility] [address] [place] [address]Phone: [phone]  Fax:  [phone] Name: Juan KirkseyMRN: [id number] of Birth: 4November 21, 1942

## 2021-01-03 ENCOUNTER — Ambulatory Visit: Payer: PPO

## 2021-01-03 ENCOUNTER — Other Ambulatory Visit: Payer: Self-pay

## 2021-01-03 DIAGNOSIS — G8929 Other chronic pain: Secondary | ICD-10-CM

## 2021-01-03 DIAGNOSIS — R262 Difficulty in walking, not elsewhere classified: Secondary | ICD-10-CM

## 2021-01-03 DIAGNOSIS — M545 Low back pain, unspecified: Secondary | ICD-10-CM

## 2021-01-03 DIAGNOSIS — M21372 Foot drop, left foot: Secondary | ICD-10-CM

## 2021-01-03 NOTE — Therapy (Signed)
[place] [facility] [address], [place], [address] Phone: [phone]   Fax:  [phone]  Physical Therapy Treatment  Patient Details  Name: Juan [name] MRN: [id number] Date of Birth: 06/10/1941 Referring Provider (PT): Jennings Books, MD   Encounter Date: 01/03/2021   PT End of Session - 01/03/21 1106    Visit Number 13    Number of Visits 25    Date for PT Re-Evaluation 02/09/21    Authorization Type 3    Authorization Time Period of 10    PT Start Time 1106    PT Stop Time 1146    PT Time Calculation (min) 40 min    Activity Tolerance Patient tolerated treatment well    Behavior During Therapy [facility] for tasks assessed/performed           Past Medical History:  Diagnosis Date  . Arthritis    back- low  . Atrial fibrillation ([place])   . Cancer (HCC)    skin ( basal) cell , face, head, back, inside L ear  . Coronary artery disease   . Dysrhythmia   . MI (myocardial infarction) ([place])   . Sleep apnea    +Cpap in use nightly , last study- 10 yrs. ago  . 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 APPENDECTOMY    . BACK SURGERY  09/2011   lumbar fusion   . CARDIAC CATHETERIZATION    . CARDIAC SURGERY    . CARDIAC VALVE REPLACEMENT    . CORONARY ARTERY BYPASS GRAFT    . EYE SURGERY Bilateral    catarascts removed-   . HERNIA REPAIR Bilateral 1946, 1990's   inguinal x3 procedures   . LUMBAR LAMINECTOMY/DECOMPRESSION MICRODISCECTOMY Left 08/14/2019   Procedure: Laminectomy for facet/synovial cyst - left - Lumbar three-Lumbar four;  Surgeon: Earnie Larsson, MD;  Location: [place];  Service: Neurosurgery;  Laterality: Left;  . TONSILLECTOMY      There were no vitals filed for this visit.   Subjective Assessment - 01/03/21 1107    Subjective Back is ok, no pain currently. Back was good this weekend. Just off and on, depends on what he is doing. Walked the  mall again arealier which caused L L5 symptoms which eased off the more he walked. Pt can pull his L foot up better to tie his shoe.    Pertinent History L foot drop. Pt had lumbar fusion November 2020 which alleviated low back pain. Pt however had L foot drop afterwards. Had PT but still has his foot drop. Pt has trouble walking and wobbles and cannot seem to walk in a straight line. L leg feels weaker than his R. Denies loss of bowel or bladder control. Had numbness R great toe (L4 dermatome) since the surgery. No L LE numbness. Denies saddle anesthesia. Currently has L low back pain which developed recently about 2 months, gradual onset. [place] mall 4-5x a week.    Patient Stated Goals Walk more stable and straight.    Currently in Pain? No/denies                                     PT Education - 01/03/21 1109    Education Details ther-ex    Person(s) Educated Patient    Methods Explanation;Demonstration;Tactile cues;Verbal cues  Comprehension Returned demonstration;Verbalized understanding           Objective   Lumbar fusiont L4-L5 with interbody spacer. Has L3 posterior elements  No Blood pressure problems per pt.  No latex allergies  (-) L calf squeeze   L foot drop  Gait: decreased L heel strike. L foot drop.   Muscle tension B lumbar paraspinal muscles.    MedbridgeAccess Code [email or web address]  Therapeutic exercise  hooklying posterior pelvic tilt 10x5 seconds for 3 sets  Mini bridge 10x3  hooklying mini crunch 10x3  Supine open books 10x5 seconds for 3 sets to promote thoracic extension and decrease low back extension stress.   Supine L ankle DF AAROM with PT 10x3 with 5 second holds    Supine L LE PNF D2 flexion with PT assist 10x3 to promote L ankle DF               S/L hip abduction  L 10x3    Standing ankle DF/PF on rocker board with B UE assist 2  minutes     Improved exercise technique, movement at target joints, use of target muscles aftermin tomod verbal, visual, tactile cues.    Manual therapy  Supine STM L medial and lateral gastroc muscle to decrease tension and fascial restrictions     Response to treatment Pt tolerated session well without aggravation of symptoms.  Clinical impression Pt continues to presents to therapy with no starting low back pain based on last few sessions suggesting overall decreased low back pain. Pt also reports better ability to put his L shoe on and tie it. Continued working on improving core and glute strength as well as thoracic extension to decrease stress to low back. Also continued working on improving L gastroc muscle flexibility and improving tibialis anterior muscle strength throughout range to promote ability to DF L foot. Pt tolerated session well without aggravation of symptoms. Pt will benefit from continued skilled physical therapy services to decrease pain, improve strength and function.        PT Short Term Goals - 12/22/20 0953      PT SHORT TERM GOAL #1   Title Pt will be independent with his initial HEP to improve L ankle strength and AROM as well as decrease low back pain.    Baseline Pt has started his HEP. (11/16/2020); No questions with HEP, able to perform them regularly (12/22/2020)    Time 3    Period Weeks    Status Achieved    Target Date 12/08/20             PT Long Term Goals - 12/22/20 0954      PT LONG TERM GOAL #1   Title Patient will improve L ankle AROM to 5 degrees or more to promote foot clearance during gait.    Baseline -17 degrees L ankle DF AROM (11/16/2020); -15 degrees L ankle DF AROM (12/22/2020)    Time 12    Period Weeks    Status Partially Met    Target Date 02/09/21      PT LONG TERM GOAL #2   Title Pt will improve bilateral hip extension and abduction strength by at least 1/2 MMT grade to promote ability to perform tasks  with less back pain.    Baseline Hip extension 4-/5 R and L, hip abduction 4/5 R and L (11/16/2020); Hip extension 4+/5 R and L, hip abduction 4/5 R and L (12/22/2020)    Time 12    Period  Weeks    Status Partially Met    Target Date 02/09/21      PT LONG TERM GOAL #3   Title Pt will improve his leg FOTO score by at least 10 points as a demonstration of improved function.    Baseline Leg FOTO 69 (11/16/2020); 73 (12/22/2020)    Time 12    Period Weeks    Status Partially Met    Target Date 02/09/21      PT LONG TERM GOAL #4   Title Pt will improve L ankle DF strength by at least 1/2 MMT grade to promote ability to ambulate with less difficulty.    Baseline L ankle DF 3-/5 (11/16/2020), (12/22/2020)    Time 12    Period Weeks    Status On-going    Target Date 02/09/21      PT LONG TERM GOAL #5   Title Pt will have a decrease in low back pain to 3/10 or less at worst to promote ability to perform functional tasks with less difficutly.    Baseline 8/10 at most for the past month (11/16/2020), 5/10 at most for the past 7 days ( 12/22/2020)    Time 7    Status New    Target Date 02/09/21                 Plan - 01/03/21 1109    Clinical Impression Statement Pt continues to presents to therapy with no starting low back pain based on last few sessions suggesting overall decreased low back pain. Pt also reports better ability to put his L shoe on and tie it. Continued working on improving core and glute strength as well as thoracic extension to decrease stress to low back. Also continued working on improving L gastroc muscle flexibility and improving tibialis anterior muscle strength throughout range to promote ability to DF L foot. Pt tolerated session well without aggravation of symptoms. Pt will benefit from continued skilled physical therapy services to decrease pain, improve strength and function.    Personal Factors and Comorbidities Age;Comorbidity 3+;Past/Current Experience;Time since  onset of injury/illness/exacerbation    Comorbidities CA, arthritis, CAD, MI, CVA, low back surgery    Examination-Activity Limitations Dressing;Transfers;Bend;Lift;Locomotion Level    Stability/Clinical Decision Making Stable/Uncomplicated    Rehab Potential Fair    PT Frequency 2x / week    PT Duration Other (comment)   7 weeks   PT Treatment/Interventions Therapeutic activities;Therapeutic exercise;Functional mobility training;Balance training;Neuromuscular re-education;Patient/family education;Manual techniques;Dry needling;Aquatic Therapy;Electrical Stimulation;Iontophoresis 36m/ml Dexamethasone    PT Next Visit Plan Hip ROM, hip, trunk, and ankle strengthening, manual techniques, modalities PRN    PT Home Exercise Plan Medbridge Access Code [email or web address]    Consulted and Agree with Plan of Care Patient           Patient will benefit from skilled therapeutic intervention in order to improve the following deficits and impairments:  Pain,Postural dysfunction,Improper body mechanics,Difficulty walking,Decreased strength,Decreased range of motion  Visit Diagnosis: Chronic left-sided low back pain, unspecified whether sciatica present  Foot drop, left  Difficulty in walking, not elsewhere classified  Chronic bilateral low back pain, unspecified whether sciatica present     Problem List Patient Active Problem List   Diagnosis Date Noted  . Synovial cyst of lumbar facet joint 08/14/2019    MJoneen BoersPT, DPT  01/03/2021, 11:51 AM  [facility] [address] [place] [id number]: [phone]  Fax:  [phone]  Name: Juan [name] MRN: [id number] Date of Birth: [birth year]

## 2021-01-05 ENCOUNTER — Other Ambulatory Visit: Payer: Self-pay

## 2021-01-05 ENCOUNTER — Ambulatory Visit: Payer: PPO

## 2021-01-05 DIAGNOSIS — M21372 Foot drop, left foot: Secondary | ICD-10-CM

## 2021-01-05 DIAGNOSIS — M545 Low back pain, unspecified: Secondary | ICD-10-CM | POA: Diagnosis not present

## 2021-01-05 DIAGNOSIS — G8929 Other chronic pain: Secondary | ICD-10-CM

## 2021-01-05 DIAGNOSIS — R262 Difficulty in walking, not elsewhere classified: Secondary | ICD-10-CM

## 2021-01-05 NOTE — Therapy (Signed)
[facility] [address], [place], [address] Phone: [phone]   Fax:  [phone]  Physical Therapy Treatment  Patient Details  Name: Juan [name] MRN: [id number] Date of Birth: 21-Mar-1941 Referring Provider (PT): Jennings Books, MD   Encounter Date: 01/05/2021   PT End of Session - 01/05/21 1018    Visit Number 14    Number of Visits 25    Date for PT Re-Evaluation 02/09/21    Authorization Type 4    Authorization Time Period of 10    PT Start Time 1019    PT Stop Time 1059    PT Time Calculation (min) 40 min    Activity Tolerance Patient tolerated treatment well    Behavior During Therapy [facility] for tasks assessed/performed           Past Medical History:  Diagnosis Date  . Arthritis    back- low  . Atrial fibrillation ([place])   . Cancer (HCC)    skin ( basal) cell , face, head, back, inside L ear  . Coronary artery disease   . Dysrhythmia   . MI (myocardial infarction) ([place])   . Sleep apnea    +Cpap in use nightly , last study- 10 yrs. ago  . 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 APPENDECTOMY    . BACK SURGERY  09/2011   lumbar fusion   . CARDIAC CATHETERIZATION    . CARDIAC SURGERY    . CARDIAC VALVE REPLACEMENT    . CORONARY ARTERY BYPASS GRAFT    . EYE SURGERY Bilateral    catarascts removed-   . HERNIA REPAIR Bilateral 1946, 1990's   inguinal x3 procedures   . LUMBAR LAMINECTOMY/DECOMPRESSION MICRODISCECTOMY Left 08/14/2019   Procedure: Laminectomy for facet/synovial cyst - left - Lumbar three-Lumbar four;  Surgeon: Earnie Larsson, MD;  Location: [facility];  Service: Neurosurgery;  Laterality: Left;  . TONSILLECTOMY      There were no vitals filed for this visit.   Subjective Assessment - 01/05/21 1020    Subjective No pain currently. Used the lawnmower and weed eater yesterday which bothered his L LE (L5 dermatome to foot). Pain  lasted for about 4 hours.    Pertinent History L foot drop. Pt had lumbar fusion November 2020 which alleviated low back pain. Pt however had L foot drop afterwards. Had PT but still has his foot drop. Pt has trouble walking and wobbles and cannot seem to walk in a straight line. L leg feels weaker than his R. Denies loss of bowel or bladder control. Had numbness R great toe (L4 dermatome) since the surgery. No L LE numbness. Denies saddle anesthesia. Currently has L low back pain which developed recently about 2 months, gradual onset. [place] 4-5x a week.    Patient Stated Goals Walk more stable and straight.    Currently in Pain? No/denies    Pain Score 0-No pain                                     PT Education - 01/05/21 1024    Education Details ther-ex    Person(s) Educated Patient    Methods Explanation;Demonstration;Tactile cues;Verbal cues    Comprehension Returned demonstration;Verbalized understanding          Objective  Lumbar fusiont L4-L5 with interbody spacer. Has L3 posterior elements  No Blood pressure problems per pt.  No latex allergies  (-) L calf squeeze   L foot drop  Gait: decreased L heel strike. L foot drop.   Muscle tension B lumbar paraspinal muscles.    MedbridgeAccess Code [email or web address]  Therapeutic exercise  Seated manually resisted trunk flexion isometrics 10x3 with 5 second holds   Seated manually resisted trunk extension isometrics in neutral, 10x3 with 5 second holds   hooklying posterior pelvic tilt 10x5 seconds for 3 sets  Hooklying manually resisted R and L lower trunk rotation isometrics in neutral 10x5 seconds each way for 3 sets  Demonstrates B glute med muscle weakness      Improved exercise technique, movement at target joints, use of target muscles aftermin tomod verbal, visual, tactile cues.    Manual therapy  Supine STM L medial and lateral gastroc muscle  to decrease tension and fascial restrictions     Response to treatment Pt tolerated session well without aggravation of symptoms.  Clinical impression Continued working on trunk strengthening to help decrease stress to low back. Continued working on improving gastroc muscle flexibility and increasing tibialis anterior muscle activation to promote ankle DF during gait. Pt tolerated session well without aggravation of symptoms. Pt will benefit from continued skilled physical therapy services to decrease pain, improve strength and function.       PT Short Term Goals - 12/22/20 0953      PT SHORT TERM GOAL #1   Title Pt will be independent with his initial HEP to improve L ankle strength and AROM as well as decrease low back pain.    Baseline Pt has started his HEP. (11/16/2020); No questions with HEP, able to perform them regularly (12/22/2020)    Time 3    Period Weeks    Status Achieved    Target Date 12/08/20             PT Long Term Goals - 12/22/20 0954      PT LONG TERM GOAL #1   Title Patient will improve L ankle AROM to 5 degrees or more to promote foot clearance during gait.    Baseline -17 degrees L ankle DF AROM (11/16/2020); -15 degrees L ankle DF AROM (12/22/2020)    Time 12    Period Weeks    Status Partially Met    Target Date 02/09/21      PT LONG TERM GOAL #2   Title Pt will improve bilateral hip extension and abduction strength by at least 1/2 MMT grade to promote ability to perform tasks with less back pain.    Baseline Hip extension 4-/5 R and L, hip abduction 4/5 R and L (11/16/2020); Hip extension 4+/5 R and L, hip abduction 4/5 R and L (12/22/2020)    Time 12    Period Weeks    Status Partially Met    Target Date 02/09/21      PT LONG TERM GOAL #3   Title Pt will improve his leg FOTO score by at least 10 points as a demonstration of improved function.    Baseline Leg FOTO 69 (11/16/2020); 73 (12/22/2020)    Time 12    Period Weeks    Status  Partially Met    Target Date 02/09/21      PT LONG TERM GOAL #4   Title Pt will improve L ankle DF strength by at least 1/2 MMT grade to promote ability to  ambulate with less difficulty.    Baseline L ankle DF 3-/5 (11/16/2020), (12/22/2020)    Time 12    Period Weeks    Status On-going    Target Date 02/09/21      PT LONG TERM GOAL #5   Title Pt will have a decrease in low back pain to 3/10 or less at worst to promote ability to perform functional tasks with less difficutly.    Baseline 8/10 at most for the past month (11/16/2020), 5/10 at most for the past 7 days ( 12/22/2020)    Time 7    Status New    Target Date 02/09/21                 Plan - 01/05/21 1026    Clinical Impression Statement Continued working on trunk strengthening to help decrease stress to low back. Continued working on improving gastroc muscle flexibility and increasing tibialis anterior muscle activation to promote ankle DF during gait. Pt tolerated session well without aggravation of symptoms. Pt will benefit from continued skilled physical therapy services to decrease pain, improve strength and function.    Personal Factors and Comorbidities Age;Comorbidity 3+;Past/Current Experience;Time since onset of injury/illness/exacerbation    Comorbidities CA, arthritis, CAD, MI, CVA, low back surgery    Examination-Activity Limitations Dressing;Transfers;Bend;Lift;Locomotion Level    Stability/Clinical Decision Making Stable/Uncomplicated    Clinical Decision Making Low    Rehab Potential Fair    PT Frequency 2x / week    PT Duration Other (comment)   7 weeks   PT Treatment/Interventions Therapeutic activities;Therapeutic exercise;Functional mobility training;Balance training;Neuromuscular re-education;Patient/family education;Manual techniques;Dry needling;Aquatic Therapy;Electrical Stimulation;Iontophoresis 67m/ml Dexamethasone    PT Next Visit Plan Hip ROM, hip, trunk, and ankle strengthening, manual techniques,  modalities PRN    PT Home Exercise Plan Medbridge Access Code [email or web address]    Consulted and Agree with Plan of Care Patient           Patient will benefit from skilled therapeutic intervention in order to improve the following deficits and impairments:  Pain,Postural dysfunction,Improper body mechanics,Difficulty walking,Decreased strength,Decreased range of motion  Visit Diagnosis: Chronic left-sided low back pain, unspecified whether sciatica present  Foot drop, left  Difficulty in walking, not elsewhere classified  Chronic bilateral low back pain, unspecified whether sciatica present     Problem List Patient Active Problem List   Diagnosis Date Noted  . Synovial cyst of lumbar facet joint 08/14/2019    MJoneen BoersPT, DPT   01/05/2021, 11:38 AM  [facility] [address] [place] [address]Phone: [phone]  Fax:  [phone] Name: Juan HenegarMRN: [id number] of Birth: [birth year]

## 2021-01-09 DIAGNOSIS — L538 Other specified erythematous conditions: Secondary | ICD-10-CM | POA: Diagnosis not present

## 2021-01-09 DIAGNOSIS — L820 Inflamed seborrheic keratosis: Secondary | ICD-10-CM | POA: Diagnosis not present

## 2021-01-09 DIAGNOSIS — L570 Actinic keratosis: Secondary | ICD-10-CM | POA: Diagnosis not present

## 2021-01-09 DIAGNOSIS — D485 Neoplasm of uncertain behavior of skin: Secondary | ICD-10-CM | POA: Diagnosis not present

## 2021-01-09 DIAGNOSIS — X32XXXA Exposure to sunlight, initial encounter: Secondary | ICD-10-CM | POA: Diagnosis not present

## 2021-01-10 ENCOUNTER — Other Ambulatory Visit: Payer: Self-pay

## 2021-01-10 ENCOUNTER — Ambulatory Visit: Payer: PPO

## 2021-01-10 DIAGNOSIS — R262 Difficulty in walking, not elsewhere classified: Secondary | ICD-10-CM

## 2021-01-10 DIAGNOSIS — G8929 Other chronic pain: Secondary | ICD-10-CM

## 2021-01-10 DIAGNOSIS — M545 Low back pain, unspecified: Secondary | ICD-10-CM | POA: Diagnosis not present

## 2021-01-10 DIAGNOSIS — M21372 Foot drop, left foot: Secondary | ICD-10-CM

## 2021-01-10 NOTE — Patient Instructions (Signed)
Access Code: [email or web address] URL: [email or web address] Date: 01/10/2021 Prepared by: Joneen Boers  Exercises Seated Transversus Abdominis Bracing - 3 x daily - 7 x weekly - 3 sets - 10 reps - 5 seconds hold Seated Hip Adduction Isometrics with Ball - 1 x daily - 7 x weekly - 3 sets - 10 reps - 5 seconds hold Seated Calf Stretch with Strap - 1 x daily - 7 x weekly - 3 sets - 10 reps - 5 secons hold Bent Knee Fallouts - 1 x daily - 7 x weekly - 1-3 sets - 5-10 reps Standing Bilateral Gastroc Stretch with Step - 3 x daily - 7 x weekly - 1 sets - 10 reps - 10 seconds hold Clamshell - 1 x daily - 7 x weekly - 3 sets - 10 reps Sidelying Hip Abduction - 1 x daily - 7 x weekly - 2 sets - 10 reps Hooklying Clamshell with Resistance - 1 x daily - 7 x weekly - 3 sets - 10 reps

## 2021-01-10 NOTE — Therapy (Signed)
[facility] [address] [place], [address] Phone: [phone]   Fax:  [phone]  Physical Therapy Treatment  Patient Details  Name: Juan [name] MRN: [id number] Date of Birth: 12/23/1940 Referring Provider (PT): Jennings Books, MD   Encounter Date: 01/10/2021   PT End of Session - 01/10/21 1544    Visit Number 15    Number of Visits 25    Date for PT Re-Evaluation 02/09/21    Authorization Type 5    Authorization Time Period of 10    PT Start Time 3748    PT Stop Time 1627    PT Time Calculation (min) 43 min    Activity Tolerance Patient tolerated treatment well    Behavior During Therapy [facility] for tasks assessed/performed           Past Medical History:  Diagnosis Date  . Arthritis    back- low  . Atrial fibrillation ([place])   . Cancer (HCC)    skin ( basal) cell , face, head, back, inside L ear  . Coronary artery disease   . Dysrhythmia   . MI (myocardial infarction) ([place])   . Sleep apnea    +Cpap in use nightly , last study- 10 yrs. ago  . 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 APPENDECTOMY    . BACK SURGERY  09/2011   lumbar fusion   . CARDIAC CATHETERIZATION    . CARDIAC SURGERY    . CARDIAC VALVE REPLACEMENT    . CORONARY ARTERY BYPASS GRAFT    . EYE SURGERY Bilateral    catarascts removed-   . HERNIA REPAIR Bilateral 1946, 1990's   inguinal x3 procedures   . LUMBAR LAMINECTOMY/DECOMPRESSION MICRODISCECTOMY Left 08/14/2019   Procedure: Laminectomy for facet/synovial cyst - left - Lumbar three-Lumbar four;  Surgeon: Earnie Larsson, MD;  Location: [facility];  Service: Neurosurgery;  Laterality: Left;  . TONSILLECTOMY      There were no vitals filed for this visit.   Subjective Assessment - 01/10/21 1545    Subjective Just a little hip pain, about a 1-2/10 on L. Did not hurt yesterday, usually has symptoms every day. 4/10 L low  back pain at most for the past 7 days.    Pertinent History L foot drop. Pt had lumbar fusion November 2020 which alleviated low back pain. Pt however had L foot drop afterwards. Had PT but still has his foot drop. Pt has trouble walking and wobbles and cannot seem to walk in a straight line. L leg feels weaker than his R. Denies loss of bowel or bladder control. Had numbness R great toe (L4 dermatome) since the surgery. No L LE numbness. Denies saddle anesthesia. Currently has L low back pain which developed recently about 2 months, gradual onset. [place]-5x a week.    Patient Stated Goals Walk more stable and straight.    Currently in Pain? Yes    Pain Score 2                                      PT Education - 01/10/21 1549    Education Details ther-ex, HEP    Person(s) Educated Patient    Methods Explanation;Demonstration;Tactile cues;Verbal cues;Handout    Comprehension Returned demonstration;Verbalized understanding  Objective   Lumbar fusiont L4-L5 with interbody spacer. Has L3 posterior elements  No Blood pressure problems per pt.  No latex allergies  (-) L calf squeeze   L foot drop  Gait: decreased L heel strike. L foot drop.   Muscle tension B lumbar paraspinal muscles.    MedbridgeAccess Code [email or web address]  Therapeutic exercise  hooklying posterior pelvic tilt 10x10 seconds for 2 sets  Hooklying manually resisted R and L lower trunk rotation isometrics in neutral 10x5 seconds each way for 3 sets             Demonstrates B glute med muscle weakness   hooklying clamshell red band 10x3  Reviewed and given as part of his HEP. Pt demonstrated and verbalized understanding. Handout provided  hookying hip adduction ball squeeze isometrics 15x5 seconds for 2 sets  Supine L ankle DF AAROM with PT 10x3 with 5 second holds     Improved exercise technique, movement at target  joints, use of target muscles aftermin tomod verbal, visual, tactile cues.    Manual therapy  Supine STM Lmedial andlateral gastroc muscle to decrease tension and fascial restrictions     Response to treatment Pt tolerated session well without aggravation of symptoms.  Clinical impression No back pain after session. Continued working on improving L tibialis anterior strength and decreasing L gastroc muscle tension to promote DF. Continued working trunk and glute strengthening to decrease stress to low back. Pt will benefit from continued skilled physical therapy services to decrease pain, improve strength and function.       PT Short Term Goals - 12/22/20 0953      PT SHORT TERM GOAL #1   Title Pt will be independent with his initial HEP to improve L ankle strength and AROM as well as decrease low back pain.    Baseline Pt has started his HEP. (11/16/2020); No questions with HEP, able to perform them regularly (12/22/2020)    Time 3    Period Weeks    Status Achieved    Target Date 12/08/20             PT Long Term Goals - 12/22/20 0954      PT LONG TERM GOAL #1   Title Patient will improve L ankle AROM to 5 degrees or more to promote foot clearance during gait.    Baseline -17 degrees L ankle DF AROM (11/16/2020); -15 degrees L ankle DF AROM (12/22/2020)    Time 12    Period Weeks    Status Partially Met    Target Date 02/09/21      PT LONG TERM GOAL #2   Title Pt will improve bilateral hip extension and abduction strength by at least 1/2 MMT grade to promote ability to perform tasks with less back pain.    Baseline Hip extension 4-/5 R and L, hip abduction 4/5 R and L (11/16/2020); Hip extension 4+/5 R and L, hip abduction 4/5 R and L (12/22/2020)    Time 12    Period Weeks    Status Partially Met    Target Date 02/09/21      PT LONG TERM GOAL #3   Title Pt will improve his leg FOTO score by at least 10 points as a demonstration of improved function.     Baseline Leg FOTO 69 (11/16/2020); 73 (12/22/2020)    Time 12    Period Weeks    Status Partially Met    Target Date 02/09/21  PT LONG TERM GOAL #4   Title Pt will improve L ankle DF strength by at least 1/2 MMT grade to promote ability to ambulate with less difficulty.    Baseline L ankle DF 3-/5 (11/16/2020), (12/22/2020)    Time 12    Period Weeks    Status On-going    Target Date 02/09/21      PT LONG TERM GOAL #5   Title Pt will have a decrease in low back pain to 3/10 or less at worst to promote ability to perform functional tasks with less difficutly.    Baseline 8/10 at most for the past month (11/16/2020), 5/10 at most for the past 7 days ( 12/22/2020)    Time 7    Status New    Target Date 02/09/21                 Plan - 01/10/21 1550    Clinical Impression Statement No back pain after session. Continued working on improving L tibialis anterior strength and decreasing L gastroc muscle tension to promote DF. Continued working trunk and glute strengthening to decrease stress to low back. Pt will benefit from continued skilled physical therapy services to decrease pain, improve strength and function.    Personal Factors and Comorbidities Age;Comorbidity 3+;Past/Current Experience;Time since onset of injury/illness/exacerbation    Comorbidities CA, arthritis, CAD, MI, CVA, low back surgery    Examination-Activity Limitations Dressing;Transfers;Bend;Lift;Locomotion Level    Stability/Clinical Decision Making Stable/Uncomplicated    Rehab Potential Fair    PT Frequency 2x / week    PT Duration Other (comment)   7 weeks   PT Treatment/Interventions Therapeutic activities;Therapeutic exercise;Functional mobility training;Balance training;Neuromuscular re-education;Patient/family education;Manual techniques;Dry needling;Aquatic Therapy;Electrical Stimulation;Iontophoresis 20m/ml Dexamethasone    PT Next Visit Plan Hip ROM, hip, trunk, and ankle strengthening, manual  techniques, modalities PRN    PT Home Exercise Plan Medbridge Access Code [email or web address]    Consulted and Agree with Plan of Care Patient           Patient will benefit from skilled therapeutic intervention in order to improve the following deficits and impairments:  Pain,Postural dysfunction,Improper body mechanics,Difficulty walking,Decreased strength,Decreased range of motion  Visit Diagnosis: Chronic left-sided low back pain, unspecified whether sciatica present  Foot drop, left  Difficulty in walking, not elsewhere classified  Chronic bilateral low back pain, unspecified whether sciatica present     Problem List Patient Active Problem List   Diagnosis Date Noted  . Synovial cyst of lumbar facet joint 08/14/2019   MJoneen BoersPT, DPT   01/10/2021, 5:18 PM  [facility] [address] [place] [address]Phone: [phone]  Fax:  [phone] Name: RXzayvion VaethMRN: [id number] of Birth: [birth year]

## 2021-01-12 ENCOUNTER — Other Ambulatory Visit: Payer: Self-pay

## 2021-01-12 ENCOUNTER — Ambulatory Visit: Payer: PPO

## 2021-01-12 DIAGNOSIS — G8929 Other chronic pain: Secondary | ICD-10-CM

## 2021-01-12 DIAGNOSIS — R262 Difficulty in walking, not elsewhere classified: Secondary | ICD-10-CM

## 2021-01-12 DIAGNOSIS — M545 Low back pain, unspecified: Secondary | ICD-10-CM

## 2021-01-12 DIAGNOSIS — M21372 Foot drop, left foot: Secondary | ICD-10-CM

## 2021-01-12 NOTE — Patient Instructions (Signed)
Decreased supine clamshell resistance to yellow secondary to L hip discomfort with red band reported. Discomfort eases quickly with rest.

## 2021-01-12 NOTE — Therapy (Signed)
[place] [facility] [address], [place], [address] Phone: [phone]   Fax:  [phone]  Physical Therapy Treatment  Patient Details  Name: Juan [name] MRN: [id number] Date of Birth: 06-06-1941 Referring Provider (PT): Jennings Books, MD   Encounter Date: 01/12/2021   PT End of Session - 01/12/21 1022    Visit Number 16    Number of Visits 25    Date for PT Re-Evaluation 02/09/21    Authorization Type 6    Authorization Time Period of 10    PT Start Time 1022    PT Stop Time 1101    PT Time Calculation (min) 39 min    Activity Tolerance Patient tolerated treatment well    Behavior During Therapy [facility] for tasks assessed/performed           Past Medical History:  Diagnosis Date  . Arthritis    back- low  . Atrial fibrillation ([place])   . Cancer (HCC)    skin ( basal) cell , face, head, back, inside L ear  . Coronary artery disease   . Dysrhythmia   . MI (myocardial infarction) ([place])   . Sleep apnea    +Cpap in use nightly , last study- 10 yrs. ago  . 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 APPENDECTOMY    . BACK SURGERY  09/2011   lumbar fusion   . CARDIAC CATHETERIZATION    . CARDIAC SURGERY    . CARDIAC VALVE REPLACEMENT    . CORONARY ARTERY BYPASS GRAFT    . EYE SURGERY Bilateral    catarascts removed-   . HERNIA REPAIR Bilateral 1946, 1990's   inguinal x3 procedures   . LUMBAR LAMINECTOMY/DECOMPRESSION MICRODISCECTOMY Left 08/14/2019   Procedure: Laminectomy for facet/synovial cyst - left - Lumbar three-Lumbar four;  Surgeon: Earnie Larsson, MD;  Location: [place];  Service: Neurosurgery;  Laterality: Left;  . TONSILLECTOMY      There were no vitals filed for this visit.   Subjective Assessment - 01/12/21 1022    Subjective Back is good, no pain currently. Better able to put his L sock and shoe on.    Pertinent History L foot drop. Pt  had lumbar fusion November 2020 which alleviated low back pain. Pt however had L foot drop afterwards. Had PT but still has his foot drop. Pt has trouble walking and wobbles and cannot seem to walk in a straight line. L leg feels weaker than his R. Denies loss of bowel or bladder control. Had numbness R great toe (L4 dermatome) since the surgery. No L LE numbness. Denies saddle anesthesia. Currently has L low back pain which developed recently about 2 months, gradual onset. [place] mall 4-5x a week.    Patient Stated Goals Walk more stable and straight.    Currently in Pain? No/denies                                     PT Education - 01/12/21 1053    Education Details ther-ex    Person(s) Educated Patient    Methods Explanation;Demonstration;Tactile cues;Verbal cues    Comprehension Returned demonstration;Verbalized understanding          Objective   Lumbar fusiont L4-L5 with interbody spacer. Has L3 posterior elements  No Blood pressure problems  per pt.  No latex allergies  (-) L calf squeeze   L foot drop  Gait: decreased L heel strike. L foot drop.   Muscle tension B lumbar paraspinal muscles.    MedbridgeAccess Code [email or web address]  Therapeutic exercise Supine L ankle DF 16 degrees at start of session  Supine L ankle DF AAROM with PT 10x3 with 5 second holds   Supine L LE PNF D2 flexion with PT assist 10x3 to promote L ankle DF   Supine L ankle DF with knee bent: -14 degrees.   Seated L LE neural flossing 10x3  Seated B scapular retraction 10x5 seconds   Seated B shoulder extension isometrics 10x 5 seconds    Improved exercise technique, movement at target joints, use of target muscles aftermin tomod verbal, visual, tactile cues.    Manual therapy  Supine STM Lmedial andlateral gastroc muscle to decrease tension and fascial restrictions  Good posterior L ankle joint mobility palpated.      Response to treatment Pt tolerated session well without aggravation of symptoms.  Clinical impression Pt overall improving with decreasing low back pain. Still demonstrates difficulty with L ankle DF AROM but reports improved ability to don and doff L sock and shoe. Continued working on improving gastroc flexibility as well as tibialis anterior muscle activation to promote ankle DF. Pt tolerated session well without aggravation of symptoms. Pt will benefit from continued skilled physical therapy services to decrease pain, improve strength and function.       PT Short Term Goals - 12/22/20 0953      PT SHORT TERM GOAL #1   Title Pt will be independent with his initial HEP to improve L ankle strength and AROM as well as decrease low back pain.    Baseline Pt has started his HEP. (11/16/2020); No questions with HEP, able to perform them regularly (12/22/2020)    Time 3    Period Weeks    Status Achieved    Target Date 12/08/20             PT Long Term Goals - 12/22/20 0954      PT LONG TERM GOAL #1   Title Patient will improve L ankle AROM to 5 degrees or more to promote foot clearance during gait.    Baseline -17 degrees L ankle DF AROM (11/16/2020); -15 degrees L ankle DF AROM (12/22/2020)    Time 12    Period Weeks    Status Partially Met    Target Date 02/09/21      PT LONG TERM GOAL #2   Title Pt will improve bilateral hip extension and abduction strength by at least 1/2 MMT grade to promote ability to perform tasks with less back pain.    Baseline Hip extension 4-/5 R and L, hip abduction 4/5 R and L (11/16/2020); Hip extension 4+/5 R and L, hip abduction 4/5 R and L (12/22/2020)    Time 12    Period Weeks    Status Partially Met    Target Date 02/09/21      PT LONG TERM GOAL #3   Title Pt will improve his leg FOTO score by at least 10 points as a demonstration of improved function.    Baseline Leg FOTO 69 (11/16/2020); 73 (12/22/2020)    Time 12    Period Weeks     Status Partially Met    Target Date 02/09/21      PT LONG TERM GOAL #4   Title Pt will improve  L ankle DF strength by at least 1/2 MMT grade to promote ability to ambulate with less difficulty.    Baseline L ankle DF 3-/5 (11/16/2020), (12/22/2020)    Time 12    Period Weeks    Status On-going    Target Date 02/09/21      PT LONG TERM GOAL #5   Title Pt will have a decrease in low back pain to 3/10 or less at worst to promote ability to perform functional tasks with less difficutly.    Baseline 8/10 at most for the past month (11/16/2020), 5/10 at most for the past 7 days ( 12/22/2020)    Time 7    Status New    Target Date 02/09/21                 Plan - 01/12/21 1053    Clinical Impression Statement Pt overall improving with decreasing low back pain. Still demonstrates difficulty with L ankle DF AROM but reports improved ability to don and doff L sock and shoe. Continued working on improving gastroc flexibility as well as tibialis anterior muscle activation to promote ankle DF. Pt tolerated session well without aggravation of symptoms. Pt will benefit from continued skilled physical therapy services to decrease pain, improve strength and function.    Personal Factors and Comorbidities Age;Comorbidity 3+;Past/Current Experience;Time since onset of injury/illness/exacerbation    Comorbidities CA, arthritis, CAD, MI, CVA, low back surgery    Examination-Activity Limitations Dressing;Transfers;Bend;Lift;Locomotion Level    Stability/Clinical Decision Making Stable/Uncomplicated    Clinical Decision Making Low    Rehab Potential Fair    PT Frequency 2x / week    PT Duration Other (comment)   7 weeks   PT Treatment/Interventions Therapeutic activities;Therapeutic exercise;Functional mobility training;Balance training;Neuromuscular re-education;Patient/family education;Manual techniques;Dry needling;Aquatic Therapy;Electrical Stimulation;Iontophoresis 53m/ml Dexamethasone    PT Next  Visit Plan Hip ROM, hip, trunk, and ankle strengthening, manual techniques, modalities PRN    PT Home Exercise Plan Medbridge Access Code [email or web address]    Consulted and Agree with Plan of Care Patient           Patient will benefit from skilled therapeutic intervention in order to improve the following deficits and impairments:  Pain,Postural dysfunction,Improper body mechanics,Difficulty walking,Decreased strength,Decreased range of motion  Visit Diagnosis: Chronic left-sided low back pain, unspecified whether sciatica present  Foot drop, left  Difficulty in walking, not elsewhere classified  Chronic bilateral low back pain, unspecified whether sciatica present     Problem List Patient Active Problem List   Diagnosis Date Noted  . Synovial cyst of lumbar facet joint 08/14/2019    MJoneen BoersPT, DPT   01/12/2021, 11:11 AM  [facility] [address] [place] [address]Phone: [phone]  Fax:  [phone] Name: RBryston ColochoMRN: [id number] of Birth: 4May 16, 1942

## 2021-01-16 ENCOUNTER — Ambulatory Visit: Payer: PPO

## 2021-01-16 DIAGNOSIS — M21372 Foot drop, left foot: Secondary | ICD-10-CM

## 2021-01-16 DIAGNOSIS — M545 Low back pain, unspecified: Secondary | ICD-10-CM | POA: Diagnosis not present

## 2021-01-16 DIAGNOSIS — R262 Difficulty in walking, not elsewhere classified: Secondary | ICD-10-CM

## 2021-01-16 DIAGNOSIS — G8929 Other chronic pain: Secondary | ICD-10-CM

## 2021-01-16 NOTE — Therapy (Signed)
[place] [facility] [address], [place], [address] Phone: [phone]   Fax:  [phone]  Physical Therapy Treatment  Patient Details  Name: Juan [name] MRN: [id number] Date of Birth: 03/12/1941 Referring Provider (PT): Jennings Books, MD   Encounter Date: 01/16/2021   PT End of Session - 01/16/21 0933    Visit Number 17    Number of Visits 25    Date for PT Re-Evaluation 02/09/21    Authorization Type 7    Authorization Time Period of 10    PT Start Time 0933    PT Stop Time 1017    PT Time Calculation (min) 44 min    Activity Tolerance Patient tolerated treatment well    Behavior During Therapy [facility] for tasks assessed/performed           Past Medical History:  Diagnosis Date  . Arthritis    back- low  . Atrial fibrillation ([place])   . Cancer (HCC)    skin ( basal) cell , face, head, back, inside L ear  . Coronary artery disease   . Dysrhythmia   . MI (myocardial infarction) ([place])   . Sleep apnea    +Cpap in use nightly , last study- 10 yrs. ago  . 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 APPENDECTOMY    . BACK SURGERY  09/2011   lumbar fusion   . CARDIAC CATHETERIZATION    . CARDIAC SURGERY    . CARDIAC VALVE REPLACEMENT    . CORONARY ARTERY BYPASS GRAFT    . EYE SURGERY Bilateral    catarascts removed-   . HERNIA REPAIR Bilateral 1946, 1990's   inguinal x3 procedures   . LUMBAR LAMINECTOMY/DECOMPRESSION MICRODISCECTOMY Left 08/14/2019   Procedure: Laminectomy for facet/synovial cyst - left - Lumbar three-Lumbar four;  Surgeon: Earnie Larsson, MD;  Location: [place];  Service: Neurosurgery;  Laterality: Left;  . TONSILLECTOMY      There were no vitals filed for this visit.   Subjective Assessment - 01/16/21 0934    Subjective No back pain currently, was good this past weekend. Does not have as much pain in back anymore. Just has the L LE  symptoms that come and go.    Pertinent History L foot drop. Pt had lumbar fusion November 2020 which alleviated low back pain. Pt however had L foot drop afterwards. Had PT but still has his foot drop. Pt has trouble walking and wobbles and cannot seem to walk in a straight line. L leg feels weaker than his R. Denies loss of bowel or bladder control. Had numbness R great toe (L4 dermatome) since the surgery. No L LE numbness. Denies saddle anesthesia. Currently has L low back pain which developed recently about 2 months, gradual onset. [place]-5x a week.    Patient Stated Goals Walk more stable and straight.    Currently in Pain? No/denies    Pain Score 0-No pain                                     PT Education - 01/16/21 0938    Education Details ther-ex    Person(s) Educated Patient    Methods Explanation;Demonstration;Tactile cues;Verbal cues    Comprehension Returned demonstration;Verbalized understanding  Objective   Lumbar fusiont L4-L5 with interbody spacer. Has L3 posterior elements  No Blood pressure problems per pt.  No latex allergies  (-) L calf squeeze   L foot drop  Gait: decreased L heel strike. L foot drop.   Muscle tension B lumbar paraspinal muscles.    MedbridgeAccess Code [email or web address]  Therapeutic exercise  hooklying posterior pelvic tilt 10x5 seconds for 3 sets  Supine open books 10x5 seconds for 3 sets to promote thoracic extension and decrease low back extension stress.  Mini bridge 10x3  hooklying mini crunch 10x3  Supine L LE PNF D2 flexion with PT assist 10x3 to promote L ankle DF               Supine L ankle DF AAROM with PT 10x3 with 5 second holds    S/L hip abduction  L 10x3  Seated manually resisted trunk flexion isometrics, PT manual resistance 10x2 with 5 second holds  Improved exercise technique, movement at target joints, use of  target muscles aftermin tomod verbal, visual, tactile cues.     Response to treatment Pt tolerated session well without aggravation of symptoms.  Clinical impression Continued working on improving trunk and glute strength to decrease stress to low back. Continued working on improving tibialis anterior muscle strength to promote DF during gait. Pt making very good progress with decreasing back pain based on subjective reports. Pt tolerated session well without aggravation of symptoms. Pt will benefit from continued skilled physical therapy services to decrease pain, improve strength and function.        PT Short Term Goals - 12/22/20 0953      PT SHORT TERM GOAL #1   Title Pt will be independent with his initial HEP to improve L ankle strength and AROM as well as decrease low back pain.    Baseline Pt has started his HEP. (11/16/2020); No questions with HEP, able to perform them regularly (12/22/2020)    Time 3    Period Weeks    Status Achieved    Target Date 12/08/20             PT Long Term Goals - 12/22/20 0954      PT LONG TERM GOAL #1   Title Patient will improve L ankle AROM to 5 degrees or more to promote foot clearance during gait.    Baseline -17 degrees L ankle DF AROM (11/16/2020); -15 degrees L ankle DF AROM (12/22/2020)    Time 12    Period Weeks    Status Partially Met    Target Date 02/09/21      PT LONG TERM GOAL #2   Title Pt will improve bilateral hip extension and abduction strength by at least 1/2 MMT grade to promote ability to perform tasks with less back pain.    Baseline Hip extension 4-/5 R and L, hip abduction 4/5 R and L (11/16/2020); Hip extension 4+/5 R and L, hip abduction 4/5 R and L (12/22/2020)    Time 12    Period Weeks    Status Partially Met    Target Date 02/09/21      PT LONG TERM GOAL #3   Title Pt will improve his leg FOTO score by at least 10 points as a demonstration of improved function.    Baseline Leg FOTO 69  (11/16/2020); 73 (12/22/2020)    Time 12    Period Weeks    Status Partially Met    Target Date 02/09/21  PT LONG TERM GOAL #4   Title Pt will improve L ankle DF strength by at least 1/2 MMT grade to promote ability to ambulate with less difficulty.    Baseline L ankle DF 3-/5 (11/16/2020), (12/22/2020)    Time 12    Period Weeks    Status On-going    Target Date 02/09/21      PT LONG TERM GOAL #5   Title Pt will have a decrease in low back pain to 3/10 or less at worst to promote ability to perform functional tasks with less difficutly.    Baseline 8/10 at most for the past month (11/16/2020), 5/10 at most for the past 7 days ( 12/22/2020)    Time 7    Status New    Target Date 02/09/21                 Plan - 01/16/21 0932    Clinical Impression Statement Continued working on improving trunk and glute strength to decrease stress to low back. Continued working on improving tibialis anterior muscle strength to promote DF during gait. Pt making very good progress with decreasing back pain based on subjective reports. Pt tolerated session well without aggravation of symptoms. Pt will benefit from continued skilled physical therapy services to decrease pain, improve strength and function.    Personal Factors and Comorbidities Age;Comorbidity 3+;Past/Current Experience;Time since onset of injury/illness/exacerbation    Comorbidities CA, arthritis, CAD, MI, CVA, low back surgery    Examination-Activity Limitations Dressing;Transfers;Bend;Lift;Locomotion Level    Stability/Clinical Decision Making Stable/Uncomplicated    Rehab Potential Fair    PT Frequency 2x / week    PT Duration Other (comment)   7 weeks   PT Treatment/Interventions Therapeutic activities;Therapeutic exercise;Functional mobility training;Balance training;Neuromuscular re-education;Patient/family education;Manual techniques;Dry needling;Aquatic Therapy;Electrical Stimulation;Iontophoresis 11m/ml Dexamethasone    PT  Next Visit Plan Hip ROM, hip, trunk, and ankle strengthening, manual techniques, modalities PRN    PT Home Exercise Plan Medbridge Access Code [email or web address]    Consulted and Agree with Plan of Care Patient           Patient will benefit from skilled therapeutic intervention in order to improve the following deficits and impairments:  Pain,Postural dysfunction,Improper body mechanics,Difficulty walking,Decreased strength,Decreased range of motion  Visit Diagnosis: Chronic left-sided low back pain, unspecified whether sciatica present  Foot drop, left  Difficulty in walking, not elsewhere classified  Chronic bilateral low back pain, unspecified whether sciatica present     Problem List Patient Active Problem List   Diagnosis Date Noted  . Synovial cyst of lumbar facet joint 08/14/2019    MJoneen BoersPT, DPT   01/16/2021, 1:03 PM  [facility] [address] [place] [address]Phone: [phone]  Fax:  [phone] Name: RArya BoxleyMRN: [id number] of Birth: 403-Mar-1942

## 2021-01-19 ENCOUNTER — Other Ambulatory Visit: Payer: Self-pay

## 2021-01-19 ENCOUNTER — Ambulatory Visit: Payer: PPO

## 2021-01-19 DIAGNOSIS — R262 Difficulty in walking, not elsewhere classified: Secondary | ICD-10-CM

## 2021-01-19 DIAGNOSIS — M545 Low back pain, unspecified: Secondary | ICD-10-CM

## 2021-01-19 DIAGNOSIS — G8929 Other chronic pain: Secondary | ICD-10-CM

## 2021-01-19 DIAGNOSIS — M21372 Foot drop, left foot: Secondary | ICD-10-CM

## 2021-01-19 NOTE — Patient Instructions (Signed)
Access Code: [email or web address] URL: [email or web address] Date: 01/19/2021 Prepared by: Joneen Boers  Exercises Seated Transversus Abdominis Bracing - 3 x daily - 7 x weekly - 3 sets - 10 reps - 5 seconds hold Seated Hip Adduction Isometrics with Ball - 1 x daily - 7 x weekly - 3 sets - 10 reps - 5 seconds hold Seated Calf Stretch with Strap - 1 x daily - 7 x weekly - 3 sets - 10 reps - 5 secons hold Bent Knee Fallouts - 1 x daily - 7 x weekly - 1-3 sets - 5-10 reps Standing Bilateral Gastroc Stretch with Step - 3 x daily - 7 x weekly - 1 sets - 10 reps - 10 seconds hold Clamshell - 1 x daily - 7 x weekly - 3 sets - 10 reps Sidelying Hip Abduction - 1 x daily - 7 x weekly - 2 sets - 10 reps Hooklying Clamshell with Resistance - 1 x daily - 7 x weekly - 3 sets - 10 reps Supine Calf Stretch with Strap - 3 x daily - 7 x weekly - 1 sets - 5 reps - 30 seconds hold

## 2021-01-19 NOTE — Therapy (Signed)
[facility] [address] [place], [address] Phone: [phone]   Fax:  [phone]  Physical Therapy Treatment  Patient Details  Name: Juan [name] MRN: [id number] Date of Birth: [birth year] Referring Provider (PT): Jennings Books, MD   Encounter Date: 01/19/2021   PT End of Session - 01/19/21 1013    Visit Number 18    Number of Visits 25    Date for PT Re-Evaluation 02/09/21    Authorization Type 8    Authorization Time Period of 10    PT Start Time 1014    PT Stop Time 1055    PT Time Calculation (min) 41 min    Activity Tolerance Patient tolerated treatment well    Behavior During Therapy [facility] for tasks assessed/performed           Past Medical History:  Diagnosis Date  . Arthritis    back- low  . Atrial fibrillation ([place])   . Cancer (HCC)    skin ( basal) cell , face, head, back, inside L ear  . Coronary artery disease   . Dysrhythmia   . MI (myocardial infarction) ([place])   . Sleep apnea    +Cpap in use nightly , last study- 10 yrs. ago  . 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 APPENDECTOMY    . BACK SURGERY  09/2011   lumbar fusion   . CARDIAC CATHETERIZATION    . CARDIAC SURGERY    . CARDIAC VALVE REPLACEMENT    . CORONARY ARTERY BYPASS GRAFT    . EYE SURGERY Bilateral    catarascts removed-   . HERNIA REPAIR Bilateral 1946, 1990's   inguinal x3 procedures   . LUMBAR LAMINECTOMY/DECOMPRESSION MICRODISCECTOMY Left 08/14/2019   Procedure: Laminectomy for facet/synovial cyst - left - Lumbar three-Lumbar four;  Surgeon: Earnie Larsson, MD;  Location: [facility];  Service: Neurosurgery;  Laterality: Left;  . TONSILLECTOMY      There were no vitals filed for this visit.   Subjective Assessment - 01/19/21 1012    Subjective Back is good, no pain currently. 3/10 low back pain at most for the past 7 days.    Pertinent History L foot drop.  Pt had lumbar fusion November 2020 which alleviated low back pain. Pt however had L foot drop afterwards. Had PT but still has his foot drop. Pt has trouble walking and wobbles and cannot seem to walk in a straight line. L leg feels weaker than his R. Denies loss of bowel or bladder control. Had numbness R great toe (L4 dermatome) since the surgery. No L LE numbness. Denies saddle anesthesia. Currently has L low back pain which developed recently about 2 months, gradual onset. [place] 4-5x a week.    Patient Stated Goals Walk more stable and straight.    Currently in Pain? No/denies    Pain Score 0-No pain              [facility] PT Assessment - 01/19/21 1400      Observation/Other Assessments   Focus on Therapeutic Outcomes (FOTO)  Leg FOTO 68                                 PT Education - 01/19/21 1016    Education Details ther-ex, HEP    Person(s) Educated  Patient    Methods Explanation;Demonstration;Tactile cues;Verbal cues;Handout    Comprehension Returned demonstration;Verbalized understanding          Objective   Lumbar fusion L4-L5 with interbody spacer. Has L3 posterior elements  No Blood pressure problems per pt.  No latex allergies  (-) L calf squeeze   L foot drop  Gait: decreased L heel strike. L foot drop.   Muscle tension B lumbar paraspinal muscles.    MedbridgeAccess Code [email or web address]  Manual therapy  Supine STM Lmedial andlateral gastroc muscle to decrease tension and fascial restrictions    Therapeutic exercise  standing L gastroc stretch 30 seconds x 3  Standing B ankle DF/PF on rocker board with B UE assist 2 min to promote L gastroc mobility  Standing backward weight shifting to promote L ankle DF 10x5 seconds   Supine L ankle DF AAROM with PT 10x3 with 5 second holds   Supine L ankle DF stretch with strap 30 seconds x 5  Improved L ankle DF AROM to -10 degrees  Seated L LE  neural flossing 10x3    Improved exercise technique, movement at target joints, use of target muscles aftermin tomod verbal, visual, tactile cues.   Response to treatment Pt tolerated session well without aggravation of symptoms.  Clinical impression Pt making very good progress with decreased low back pain with hightest level being 3/10 for the past 7 days based on subjective reports. Improved L ankle DF AROM with L gastroc stretch. Pt tolerated session well without aggravation of symptoms. Pt will benefit from continued skilled physical therapy services to decrease pain, improve ankle ROM, improve strength and function.           PT Short Term Goals - 12/22/20 0953      PT SHORT TERM GOAL #1   Title Pt will be independent with his initial HEP to improve L ankle strength and AROM as well as decrease low back pain.    Baseline Pt has started his HEP. (11/16/2020); No questions with HEP, able to perform them regularly (12/22/2020)    Time 3    Period Weeks    Status Achieved    Target Date 12/08/20             PT Long Term Goals - 01/19/21 1402      PT LONG TERM GOAL #1   Title Patient will improve L ankle AROM to 5 degrees or more to promote foot clearance during gait.    Baseline -17 degrees L ankle DF AROM (11/16/2020); -15 degrees L ankle DF AROM (12/22/2020); ranges from -15 degrees to -10 degrees, improved to -10 after gastroc stretch (01/19/2021)    Time 12    Period Weeks    Status Partially Met    Target Date 02/09/21      PT LONG TERM GOAL #2   Title Pt will improve bilateral hip extension and abduction strength by at least 1/2 MMT grade to promote ability to perform tasks with less back pain.    Baseline Hip extension 4-/5 R and L, hip abduction 4/5 R and L (11/16/2020); Hip extension 4+/5 R and L, hip abduction 4/5 R and L (12/22/2020)    Time 12    Period Weeks    Status Partially Met    Target Date 02/09/21      PT LONG TERM GOAL #3   Title Pt will  improve his leg FOTO score by at least 10 points as a demonstration of improved function.  Baseline Leg FOTO 69 (11/16/2020); 73 (12/22/2020); 68 (01/19/2021)    Time 12    Period Weeks    Status Partially Met    Target Date 02/09/21      PT LONG TERM GOAL #4   Title Pt will improve L ankle DF strength by at least 1/2 MMT grade to promote ability to ambulate with less difficulty.    Baseline L ankle DF 3-/5 (11/16/2020), (12/22/2020), (01/19/2021)    Time 12    Period Weeks    Status On-going    Target Date 02/09/21      PT LONG TERM GOAL #5   Title Pt will have a decrease in low back pain to 3/10 or less at worst to promote ability to perform functional tasks with less difficutly.    Baseline 8/10 at most for the past month (11/16/2020), 5/10 at most for the past 7 days ( 12/22/2020); 3/10 at most for the past 7 days (01/19/2021)    Time 7    Status Achieved    Target Date 02/09/21                 Plan - 01/19/21 1012    Clinical Impression Statement Pt making very good progress with decreased low back pain with hightest level being 3/10 for the past 7 days based on subjective reports. Improved L ankle DF AROM with L gastroc stretch. Pt tolerated session well without aggravation of symptoms. Pt will benefit from continued skilled physical therapy services to decrease pain, improve ankle ROM, improve strength and function.    Personal Factors and Comorbidities Age;Comorbidity 3+;Past/Current Experience;Time since onset of injury/illness/exacerbation    Comorbidities CA, arthritis, CAD, MI, CVA, low back surgery    Examination-Activity Limitations Dressing;Transfers;Bend;Lift;Locomotion Level    Stability/Clinical Decision Making Stable/Uncomplicated    Clinical Decision Making Low    Rehab Potential Fair    PT Frequency 2x / week    PT Duration Other (comment)   7 weeks   PT Treatment/Interventions Therapeutic activities;Therapeutic exercise;Functional mobility training;Balance  training;Neuromuscular re-education;Patient/family education;Manual techniques;Dry needling;Aquatic Therapy;Electrical Stimulation;Iontophoresis 56m/ml Dexamethasone    PT Next Visit Plan Hip ROM, hip, trunk, and ankle strengthening, manual techniques, modalities PRN    PT Home Exercise Plan Medbridge Access Code [email or web address]    Consulted and Agree with Plan of Care Patient           Patient will benefit from skilled therapeutic intervention in order to improve the following deficits and impairments:  Pain,Postural dysfunction,Improper body mechanics,Difficulty walking,Decreased strength,Decreased range of motion  Visit Diagnosis: Chronic left-sided low back pain, unspecified whether sciatica present  Foot drop, left  Difficulty in walking, not elsewhere classified  Chronic bilateral low back pain, unspecified whether sciatica present     Problem List Patient Active Problem List   Diagnosis Date Noted  . Synovial cyst of lumbar facet joint 08/14/2019    MJoneen BoersPT, DPT   01/19/2021, 2:07 PM  [facility] [address] [place] [address]Phone: [phone]  Fax:  [phone] Name: RJojo PehlMRN: [id number] of Birth: [birth year]

## 2021-02-08 ENCOUNTER — Other Ambulatory Visit: Payer: Self-pay | Admitting: Orthopedic Surgery

## 2021-02-08 DIAGNOSIS — G8929 Other chronic pain: Secondary | ICD-10-CM

## 2021-02-08 DIAGNOSIS — M5416 Radiculopathy, lumbar region: Secondary | ICD-10-CM

## 2021-02-08 DIAGNOSIS — M4807 Spinal stenosis, lumbosacral region: Secondary | ICD-10-CM

## 2021-02-14 ENCOUNTER — Ambulatory Visit
Admission: RE | Admit: 2021-02-14 | Discharge: 2021-02-14 | Disposition: A | Discharge status: Home / Self Care | Payer: PPO | Source: Ambulatory Visit | Attending: Orthopedic Surgery | Admitting: Orthopedic Surgery

## 2021-02-14 ENCOUNTER — Other Ambulatory Visit: Payer: Self-pay

## 2021-02-14 DIAGNOSIS — M545 Low back pain, unspecified: Secondary | ICD-10-CM | POA: Diagnosis not present

## 2021-02-14 DIAGNOSIS — M4807 Spinal stenosis, lumbosacral region: Secondary | ICD-10-CM

## 2021-02-14 DIAGNOSIS — M5442 Lumbago with sciatica, left side: Secondary | ICD-10-CM | POA: Diagnosis not present

## 2021-02-14 DIAGNOSIS — G8929 Other chronic pain: Secondary | ICD-10-CM

## 2021-02-14 DIAGNOSIS — M5416 Radiculopathy, lumbar region: Secondary | ICD-10-CM

## 2021-02-22 DIAGNOSIS — Z79899 Other long term (current) drug therapy: Secondary | ICD-10-CM | POA: Diagnosis not present

## 2021-02-22 DIAGNOSIS — Z0000 Encounter for general adult medical examination without abnormal findings: Secondary | ICD-10-CM | POA: Diagnosis not present

## 2021-02-22 DIAGNOSIS — I480 Paroxysmal atrial fibrillation: Secondary | ICD-10-CM | POA: Diagnosis not present

## 2021-02-22 DIAGNOSIS — E559 Vitamin D deficiency, unspecified: Secondary | ICD-10-CM | POA: Diagnosis not present

## 2021-02-22 DIAGNOSIS — G4733 Obstructive sleep apnea (adult) (pediatric): Secondary | ICD-10-CM | POA: Diagnosis not present

## 2021-02-22 DIAGNOSIS — I2510 Atherosclerotic heart disease of native coronary artery without angina pectoris: Secondary | ICD-10-CM | POA: Diagnosis not present

## 2021-02-22 DIAGNOSIS — E782 Mixed hyperlipidemia: Secondary | ICD-10-CM | POA: Diagnosis not present

## 2021-02-22 DIAGNOSIS — T466X5A Adverse effect of antihyperlipidemic and antiarteriosclerotic drugs, initial encounter: Secondary | ICD-10-CM | POA: Diagnosis not present

## 2021-02-22 DIAGNOSIS — G720 Drug-induced myopathy: Secondary | ICD-10-CM | POA: Diagnosis not present

## 2021-02-22 DIAGNOSIS — R9082 White matter disease, unspecified: Secondary | ICD-10-CM | POA: Diagnosis not present

## 2021-02-22 DIAGNOSIS — R7309 Other abnormal glucose: Secondary | ICD-10-CM | POA: Diagnosis not present

## 2021-02-22 DIAGNOSIS — N1831 Chronic kidney disease, stage 3a: Secondary | ICD-10-CM | POA: Diagnosis not present

## 2021-02-22 DIAGNOSIS — Z1211 Encounter for screening for malignant neoplasm of colon: Secondary | ICD-10-CM | POA: Diagnosis not present

## 2021-02-23 DIAGNOSIS — E559 Vitamin D deficiency, unspecified: Secondary | ICD-10-CM | POA: Diagnosis not present

## 2021-02-23 DIAGNOSIS — Z79899 Other long term (current) drug therapy: Secondary | ICD-10-CM | POA: Diagnosis not present

## 2021-02-23 DIAGNOSIS — R7309 Other abnormal glucose: Secondary | ICD-10-CM | POA: Diagnosis not present

## 2021-02-23 DIAGNOSIS — E782 Mixed hyperlipidemia: Secondary | ICD-10-CM | POA: Diagnosis not present

## 2021-02-24 DIAGNOSIS — M5126 Other intervertebral disc displacement, lumbar region: Secondary | ICD-10-CM | POA: Diagnosis not present

## 2021-02-24 DIAGNOSIS — M5416 Radiculopathy, lumbar region: Secondary | ICD-10-CM | POA: Diagnosis not present

## 2021-03-13 DIAGNOSIS — Z1211 Encounter for screening for malignant neoplasm of colon: Secondary | ICD-10-CM | POA: Diagnosis not present

## 2021-03-20 DIAGNOSIS — M5136 Other intervertebral disc degeneration, lumbar region: Secondary | ICD-10-CM | POA: Diagnosis not present

## 2021-03-20 DIAGNOSIS — M5416 Radiculopathy, lumbar region: Secondary | ICD-10-CM | POA: Diagnosis not present

## 2021-03-20 DIAGNOSIS — M5126 Other intervertebral disc displacement, lumbar region: Secondary | ICD-10-CM | POA: Diagnosis not present

## 2021-03-29 DIAGNOSIS — G4733 Obstructive sleep apnea (adult) (pediatric): Secondary | ICD-10-CM | POA: Diagnosis not present

## 2021-03-29 DIAGNOSIS — Z951 Presence of aortocoronary bypass graft: Secondary | ICD-10-CM | POA: Diagnosis not present

## 2021-03-29 DIAGNOSIS — I480 Paroxysmal atrial fibrillation: Secondary | ICD-10-CM | POA: Diagnosis not present

## 2021-03-29 DIAGNOSIS — I2510 Atherosclerotic heart disease of native coronary artery without angina pectoris: Secondary | ICD-10-CM | POA: Diagnosis not present

## 2021-03-29 DIAGNOSIS — E782 Mixed hyperlipidemia: Secondary | ICD-10-CM | POA: Diagnosis not present

## 2021-03-29 DIAGNOSIS — I059 Rheumatic mitral valve disease, unspecified: Secondary | ICD-10-CM | POA: Diagnosis not present

## 2021-03-29 DIAGNOSIS — Z9989 Dependence on other enabling machines and devices: Secondary | ICD-10-CM | POA: Diagnosis not present

## 2021-03-29 DIAGNOSIS — Z9889 Other specified postprocedural states: Secondary | ICD-10-CM | POA: Diagnosis not present

## 2021-05-03 DIAGNOSIS — I059 Rheumatic mitral valve disease, unspecified: Secondary | ICD-10-CM | POA: Diagnosis not present

## 2021-05-03 DIAGNOSIS — I2510 Atherosclerotic heart disease of native coronary artery without angina pectoris: Secondary | ICD-10-CM | POA: Diagnosis not present

## 2021-05-15 DIAGNOSIS — D0421 Carcinoma in situ of skin of right ear and external auricular canal: Secondary | ICD-10-CM | POA: Diagnosis not present

## 2021-05-15 DIAGNOSIS — D2272 Melanocytic nevi of left lower limb, including hip: Secondary | ICD-10-CM | POA: Diagnosis not present

## 2021-05-15 DIAGNOSIS — X32XXXA Exposure to sunlight, initial encounter: Secondary | ICD-10-CM | POA: Diagnosis not present

## 2021-05-15 DIAGNOSIS — Z85828 Personal history of other malignant neoplasm of skin: Secondary | ICD-10-CM | POA: Diagnosis not present

## 2021-05-15 DIAGNOSIS — D485 Neoplasm of uncertain behavior of skin: Secondary | ICD-10-CM | POA: Diagnosis not present

## 2021-05-15 DIAGNOSIS — D2262 Melanocytic nevi of left upper limb, including shoulder: Secondary | ICD-10-CM | POA: Diagnosis not present

## 2021-05-15 DIAGNOSIS — D2261 Melanocytic nevi of right upper limb, including shoulder: Secondary | ICD-10-CM | POA: Diagnosis not present

## 2021-05-15 DIAGNOSIS — L02423 Furuncle of right upper limb: Secondary | ICD-10-CM | POA: Diagnosis not present

## 2021-05-15 DIAGNOSIS — L570 Actinic keratosis: Secondary | ICD-10-CM | POA: Diagnosis not present

## 2021-05-22 DIAGNOSIS — T7840XA Allergy, unspecified, initial encounter: Secondary | ICD-10-CM | POA: Diagnosis not present

## 2021-05-22 DIAGNOSIS — R21 Rash and other nonspecific skin eruption: Secondary | ICD-10-CM | POA: Diagnosis not present

## 2021-05-25 DIAGNOSIS — C44222 Squamous cell carcinoma of skin of right ear and external auricular canal: Secondary | ICD-10-CM | POA: Diagnosis not present

## 2021-05-30 DIAGNOSIS — L309 Dermatitis, unspecified: Secondary | ICD-10-CM | POA: Diagnosis not present

## 2021-05-30 DIAGNOSIS — L308 Other specified dermatitis: Secondary | ICD-10-CM | POA: Diagnosis not present

## 2021-05-31 DIAGNOSIS — R21 Rash and other nonspecific skin eruption: Secondary | ICD-10-CM | POA: Diagnosis not present

## 2021-06-05 DIAGNOSIS — L309 Dermatitis, unspecified: Secondary | ICD-10-CM | POA: Diagnosis not present

## 2021-06-14 DIAGNOSIS — L309 Dermatitis, unspecified: Secondary | ICD-10-CM | POA: Diagnosis not present

## 2021-06-21 DIAGNOSIS — H6123 Impacted cerumen, bilateral: Secondary | ICD-10-CM | POA: Diagnosis not present

## 2021-06-21 DIAGNOSIS — H903 Sensorineural hearing loss, bilateral: Secondary | ICD-10-CM | POA: Diagnosis not present

## 2021-07-19 DIAGNOSIS — L309 Dermatitis, unspecified: Secondary | ICD-10-CM | POA: Diagnosis not present

## 2021-07-27 DIAGNOSIS — Z79631 Long term (current) use of antimetabolite agent: Secondary | ICD-10-CM | POA: Diagnosis not present

## 2021-07-27 DIAGNOSIS — L120 Bullous pemphigoid: Secondary | ICD-10-CM | POA: Diagnosis not present

## 2021-07-27 DIAGNOSIS — Z79899 Other long term (current) drug therapy: Secondary | ICD-10-CM | POA: Diagnosis not present

## 2021-08-01 DIAGNOSIS — I2510 Atherosclerotic heart disease of native coronary artery without angina pectoris: Secondary | ICD-10-CM | POA: Diagnosis not present

## 2021-08-01 DIAGNOSIS — G720 Drug-induced myopathy: Secondary | ICD-10-CM | POA: Diagnosis not present

## 2021-08-01 DIAGNOSIS — T466X5A Adverse effect of antihyperlipidemic and antiarteriosclerotic drugs, initial encounter: Secondary | ICD-10-CM | POA: Diagnosis not present

## 2021-08-01 DIAGNOSIS — Z0000 Encounter for general adult medical examination without abnormal findings: Secondary | ICD-10-CM | POA: Diagnosis not present

## 2021-08-01 DIAGNOSIS — E782 Mixed hyperlipidemia: Secondary | ICD-10-CM | POA: Diagnosis not present

## 2021-08-01 DIAGNOSIS — I482 Chronic atrial fibrillation, unspecified: Secondary | ICD-10-CM | POA: Diagnosis not present

## 2021-08-01 DIAGNOSIS — R7309 Other abnormal glucose: Secondary | ICD-10-CM | POA: Diagnosis not present

## 2021-08-01 DIAGNOSIS — E559 Vitamin D deficiency, unspecified: Secondary | ICD-10-CM | POA: Diagnosis not present

## 2021-08-01 DIAGNOSIS — G4733 Obstructive sleep apnea (adult) (pediatric): Secondary | ICD-10-CM | POA: Diagnosis not present

## 2021-08-01 DIAGNOSIS — Z9989 Dependence on other enabling machines and devices: Secondary | ICD-10-CM | POA: Diagnosis not present

## 2021-08-01 DIAGNOSIS — Z79899 Other long term (current) drug therapy: Secondary | ICD-10-CM | POA: Diagnosis not present

## 2021-08-21 DIAGNOSIS — N289 Disorder of kidney and ureter, unspecified: Secondary | ICD-10-CM | POA: Diagnosis not present

## 2021-09-08 DIAGNOSIS — L120 Bullous pemphigoid: Secondary | ICD-10-CM | POA: Diagnosis not present

## 2021-09-08 DIAGNOSIS — Z79899 Other long term (current) drug therapy: Secondary | ICD-10-CM | POA: Diagnosis not present

## 2021-09-20 DIAGNOSIS — N289 Disorder of kidney and ureter, unspecified: Secondary | ICD-10-CM | POA: Diagnosis not present

## 2021-10-10 DIAGNOSIS — Z951 Presence of aortocoronary bypass graft: Secondary | ICD-10-CM | POA: Diagnosis not present

## 2021-10-10 DIAGNOSIS — I5020 Unspecified systolic (congestive) heart failure: Secondary | ICD-10-CM | POA: Diagnosis not present

## 2021-10-10 DIAGNOSIS — I059 Rheumatic mitral valve disease, unspecified: Secondary | ICD-10-CM | POA: Diagnosis not present

## 2021-10-10 DIAGNOSIS — I4819 Other persistent atrial fibrillation: Secondary | ICD-10-CM | POA: Diagnosis not present

## 2021-10-17 DIAGNOSIS — M7989 Other specified soft tissue disorders: Secondary | ICD-10-CM | POA: Diagnosis not present

## 2021-10-17 DIAGNOSIS — I5020 Unspecified systolic (congestive) heart failure: Secondary | ICD-10-CM | POA: Diagnosis not present

## 2021-10-17 DIAGNOSIS — I480 Paroxysmal atrial fibrillation: Secondary | ICD-10-CM | POA: Diagnosis not present

## 2021-10-26 DIAGNOSIS — L570 Actinic keratosis: Secondary | ICD-10-CM | POA: Diagnosis not present

## 2021-10-26 DIAGNOSIS — L309 Dermatitis, unspecified: Secondary | ICD-10-CM | POA: Diagnosis not present

## 2021-10-26 DIAGNOSIS — L821 Other seborrheic keratosis: Secondary | ICD-10-CM | POA: Diagnosis not present

## 2021-10-26 DIAGNOSIS — D225 Melanocytic nevi of trunk: Secondary | ICD-10-CM | POA: Diagnosis not present

## 2021-10-26 DIAGNOSIS — D2271 Melanocytic nevi of right lower limb, including hip: Secondary | ICD-10-CM | POA: Diagnosis not present

## 2021-10-26 DIAGNOSIS — Z85828 Personal history of other malignant neoplasm of skin: Secondary | ICD-10-CM | POA: Diagnosis not present

## 2021-10-30 DIAGNOSIS — G3184 Mild cognitive impairment, so stated: Secondary | ICD-10-CM | POA: Diagnosis not present

## 2021-10-30 DIAGNOSIS — M21372 Foot drop, left foot: Secondary | ICD-10-CM | POA: Diagnosis not present

## 2021-10-30 DIAGNOSIS — Z8673 Personal history of transient ischemic attack (TIA), and cerebral infarction without residual deficits: Secondary | ICD-10-CM | POA: Diagnosis not present

## 2021-10-30 DIAGNOSIS — I480 Paroxysmal atrial fibrillation: Secondary | ICD-10-CM | POA: Diagnosis not present

## 2021-11-01 DIAGNOSIS — Z0000 Encounter for general adult medical examination without abnormal findings: Secondary | ICD-10-CM | POA: Diagnosis not present

## 2021-11-01 DIAGNOSIS — E7800 Pure hypercholesterolemia, unspecified: Secondary | ICD-10-CM | POA: Diagnosis not present

## 2021-11-01 DIAGNOSIS — Z125 Encounter for screening for malignant neoplasm of prostate: Secondary | ICD-10-CM | POA: Diagnosis not present

## 2021-11-01 DIAGNOSIS — I482 Chronic atrial fibrillation, unspecified: Secondary | ICD-10-CM | POA: Diagnosis not present

## 2021-11-01 DIAGNOSIS — G4733 Obstructive sleep apnea (adult) (pediatric): Secondary | ICD-10-CM | POA: Diagnosis not present

## 2021-11-01 DIAGNOSIS — G720 Drug-induced myopathy: Secondary | ICD-10-CM | POA: Diagnosis not present

## 2021-11-01 DIAGNOSIS — E559 Vitamin D deficiency, unspecified: Secondary | ICD-10-CM | POA: Diagnosis not present

## 2021-11-01 DIAGNOSIS — Z9989 Dependence on other enabling machines and devices: Secondary | ICD-10-CM | POA: Diagnosis not present

## 2021-11-01 DIAGNOSIS — T466X5A Adverse effect of antihyperlipidemic and antiarteriosclerotic drugs, initial encounter: Secondary | ICD-10-CM | POA: Diagnosis not present

## 2021-11-01 DIAGNOSIS — N1831 Chronic kidney disease, stage 3a: Secondary | ICD-10-CM | POA: Diagnosis not present

## 2021-11-01 DIAGNOSIS — Z79899 Other long term (current) drug therapy: Secondary | ICD-10-CM | POA: Diagnosis not present

## 2021-11-01 DIAGNOSIS — R7309 Other abnormal glucose: Secondary | ICD-10-CM | POA: Diagnosis not present

## 2021-11-01 DIAGNOSIS — I2510 Atherosclerotic heart disease of native coronary artery without angina pectoris: Secondary | ICD-10-CM | POA: Diagnosis not present

## 2021-12-14 DIAGNOSIS — L120 Bullous pemphigoid: Secondary | ICD-10-CM | POA: Diagnosis not present

## 2021-12-14 DIAGNOSIS — Z79899 Other long term (current) drug therapy: Secondary | ICD-10-CM | POA: Diagnosis not present

## 2021-12-20 DIAGNOSIS — H6123 Impacted cerumen, bilateral: Secondary | ICD-10-CM | POA: Diagnosis not present

## 2021-12-20 DIAGNOSIS — H903 Sensorineural hearing loss, bilateral: Secondary | ICD-10-CM | POA: Diagnosis not present

## 2022-01-29 DIAGNOSIS — T466X5A Adverse effect of antihyperlipidemic and antiarteriosclerotic drugs, initial encounter: Secondary | ICD-10-CM | POA: Diagnosis not present

## 2022-01-29 DIAGNOSIS — R7309 Other abnormal glucose: Secondary | ICD-10-CM | POA: Diagnosis not present

## 2022-01-29 DIAGNOSIS — E559 Vitamin D deficiency, unspecified: Secondary | ICD-10-CM | POA: Diagnosis not present

## 2022-01-29 DIAGNOSIS — Z79899 Other long term (current) drug therapy: Secondary | ICD-10-CM | POA: Diagnosis not present

## 2022-01-29 DIAGNOSIS — I2510 Atherosclerotic heart disease of native coronary artery without angina pectoris: Secondary | ICD-10-CM | POA: Diagnosis not present

## 2022-01-29 DIAGNOSIS — N138 Other obstructive and reflux uropathy: Secondary | ICD-10-CM | POA: Diagnosis not present

## 2022-01-29 DIAGNOSIS — Z8673 Personal history of transient ischemic attack (TIA), and cerebral infarction without residual deficits: Secondary | ICD-10-CM | POA: Diagnosis not present

## 2022-01-29 DIAGNOSIS — E7800 Pure hypercholesterolemia, unspecified: Secondary | ICD-10-CM | POA: Diagnosis not present

## 2022-01-29 DIAGNOSIS — G720 Drug-induced myopathy: Secondary | ICD-10-CM | POA: Diagnosis not present

## 2022-01-29 DIAGNOSIS — I5020 Unspecified systolic (congestive) heart failure: Secondary | ICD-10-CM | POA: Diagnosis not present

## 2022-01-29 DIAGNOSIS — N401 Enlarged prostate with lower urinary tract symptoms: Secondary | ICD-10-CM | POA: Diagnosis not present

## 2022-04-03 DIAGNOSIS — Z951 Presence of aortocoronary bypass graft: Secondary | ICD-10-CM | POA: Diagnosis not present

## 2022-04-03 DIAGNOSIS — I480 Paroxysmal atrial fibrillation: Secondary | ICD-10-CM | POA: Diagnosis not present

## 2022-04-03 DIAGNOSIS — I5020 Unspecified systolic (congestive) heart failure: Secondary | ICD-10-CM | POA: Diagnosis not present

## 2022-04-03 DIAGNOSIS — I059 Rheumatic mitral valve disease, unspecified: Secondary | ICD-10-CM | POA: Diagnosis not present

## 2022-04-12 DIAGNOSIS — Z79899 Other long term (current) drug therapy: Secondary | ICD-10-CM | POA: Diagnosis not present

## 2022-04-12 DIAGNOSIS — L120 Bullous pemphigoid: Secondary | ICD-10-CM | POA: Diagnosis not present

## 2022-04-12 DIAGNOSIS — Z79631 Long term (current) use of antimetabolite agent: Secondary | ICD-10-CM | POA: Diagnosis not present

## 2022-04-13 DIAGNOSIS — Z9989 Dependence on other enabling machines and devices: Secondary | ICD-10-CM | POA: Diagnosis not present

## 2022-04-13 DIAGNOSIS — E538 Deficiency of other specified B group vitamins: Secondary | ICD-10-CM | POA: Diagnosis not present

## 2022-04-13 DIAGNOSIS — G4733 Obstructive sleep apnea (adult) (pediatric): Secondary | ICD-10-CM | POA: Diagnosis not present

## 2022-04-13 DIAGNOSIS — E559 Vitamin D deficiency, unspecified: Secondary | ICD-10-CM | POA: Diagnosis not present

## 2022-04-13 DIAGNOSIS — I480 Paroxysmal atrial fibrillation: Secondary | ICD-10-CM | POA: Diagnosis not present

## 2022-04-13 DIAGNOSIS — G3184 Mild cognitive impairment, so stated: Secondary | ICD-10-CM | POA: Diagnosis not present

## 2022-04-13 DIAGNOSIS — Z8673 Personal history of transient ischemic attack (TIA), and cerebral infarction without residual deficits: Secondary | ICD-10-CM | POA: Diagnosis not present

## 2022-04-13 DIAGNOSIS — E519 Thiamine deficiency, unspecified: Secondary | ICD-10-CM | POA: Diagnosis not present

## 2022-04-17 ENCOUNTER — Other Ambulatory Visit (HOSPITAL_COMMUNITY): Payer: Self-pay | Admitting: Student

## 2022-04-17 ENCOUNTER — Other Ambulatory Visit: Payer: Self-pay | Admitting: Student

## 2022-04-17 DIAGNOSIS — G3184 Mild cognitive impairment, so stated: Secondary | ICD-10-CM

## 2022-04-18 DIAGNOSIS — I480 Paroxysmal atrial fibrillation: Secondary | ICD-10-CM | POA: Diagnosis not present

## 2022-04-18 DIAGNOSIS — I059 Rheumatic mitral valve disease, unspecified: Secondary | ICD-10-CM | POA: Diagnosis not present

## 2022-04-18 DIAGNOSIS — Z951 Presence of aortocoronary bypass graft: Secondary | ICD-10-CM | POA: Diagnosis not present

## 2022-04-24 DIAGNOSIS — E7800 Pure hypercholesterolemia, unspecified: Secondary | ICD-10-CM | POA: Diagnosis not present

## 2022-04-24 DIAGNOSIS — R7309 Other abnormal glucose: Secondary | ICD-10-CM | POA: Diagnosis not present

## 2022-04-24 DIAGNOSIS — Z79899 Other long term (current) drug therapy: Secondary | ICD-10-CM | POA: Diagnosis not present

## 2022-05-02 ENCOUNTER — Emergency Department (HOSPITAL_COMMUNITY)
Admission: EM | Admit: 2022-05-02 | Discharge: 2022-05-02 | Discharge status: Against Medical Advice | Payer: PPO | Attending: Physician Assistant | Admitting: Physician Assistant

## 2022-05-02 ENCOUNTER — Encounter (HOSPITAL_COMMUNITY): Payer: Self-pay | Admitting: *Deleted

## 2022-05-02 ENCOUNTER — Other Ambulatory Visit: Payer: Self-pay

## 2022-05-02 DIAGNOSIS — G3184 Mild cognitive impairment, so stated: Secondary | ICD-10-CM | POA: Diagnosis not present

## 2022-05-02 DIAGNOSIS — R9431 Abnormal electrocardiogram [ECG] [EKG]: Secondary | ICD-10-CM | POA: Diagnosis not present

## 2022-05-02 DIAGNOSIS — Z5321 Procedure and treatment not carried out due to patient leaving prior to being seen by health care provider: Secondary | ICD-10-CM | POA: Diagnosis not present

## 2022-05-02 DIAGNOSIS — Q048 Other specified congenital malformations of brain: Secondary | ICD-10-CM | POA: Diagnosis not present

## 2022-05-02 DIAGNOSIS — R413 Other amnesia: Secondary | ICD-10-CM | POA: Diagnosis not present

## 2022-05-02 LAB — CBC WITH DIFFERENTIAL/PLATELET
Abs Immature Granulocytes: 0.04 10*3/uL (ref 0.00–0.07)
Basophils Absolute: 0 10*3/uL (ref 0.0–0.1)
Basophils Relative: 0 %
Eosinophils Absolute: 0 10*3/uL (ref 0.0–0.5)
Eosinophils Relative: 0 %
HCT: 40 % (ref 39.0–52.0)
Hemoglobin: 13.6 g/dL (ref 13.0–17.0)
Immature Granulocytes: 1 %
Lymphocytes Relative: 5 %
Lymphs Abs: 0.4 10*3/uL — ABNORMAL LOW (ref 0.7–4.0)
MCH: 34 pg (ref 26.0–34.0)
MCHC: 34 g/dL (ref 30.0–36.0)
MCV: 100 fL (ref 80.0–100.0)
Monocytes Absolute: 0.3 10*3/uL (ref 0.1–1.0)
Monocytes Relative: 4 %
Neutro Abs: 7.1 10*3/uL (ref 1.7–7.7)
Neutrophils Relative %: 90 %
Platelets: 131 10*3/uL — ABNORMAL LOW (ref 150–400)
RBC: 4 MIL/uL — ABNORMAL LOW (ref 4.22–5.81)
RDW: 13.1 % (ref 11.5–15.5)
WBC: 7.9 10*3/uL (ref 4.0–10.5)
nRBC: 0 % (ref 0.0–0.2)

## 2022-05-02 LAB — COMPREHENSIVE METABOLIC PANEL
ALT: 18 U/L (ref 0–44)
AST: 36 U/L (ref 15–41)
Albumin: 3.7 g/dL (ref 3.5–5.0)
Alkaline Phosphatase: 40 U/L (ref 38–126)
Anion gap: 6 (ref 5–15)
BUN: 18 mg/dL (ref 8–23)
CO2: 30 mmol/L (ref 22–32)
Calcium: 9.3 mg/dL (ref 8.9–10.3)
Chloride: 96 mmol/L — ABNORMAL LOW (ref 98–111)
Creatinine, Ser: 1.08 mg/dL (ref 0.61–1.24)
GFR, Estimated: >60 mL/min (ref >60)
Glucose, Bld: 136 mg/dL — ABNORMAL HIGH (ref 70–99)
Potassium: 4.9 mmol/L (ref 3.5–5.1)
Sodium: 132 mmol/L — ABNORMAL LOW (ref 135–145)
Total Bilirubin: 0.9 mg/dL (ref 0.3–1.2)
Total Protein: 6 g/dL — ABNORMAL LOW (ref 6.5–8.1)

## 2022-05-02 LAB — PROTIME-INR
INR: 1.1 (ref 0.8–1.2)
Prothrombin Time: 14.2 seconds (ref 11.4–15.2)

## 2022-05-02 LAB — URINALYSIS, ROUTINE W REFLEX MICROSCOPIC
Bilirubin Urine: NEGATIVE
Glucose, UA: NEGATIVE mg/dL
Hgb urine dipstick: NEGATIVE
Ketones, ur: NEGATIVE mg/dL
Leukocytes,Ua: NEGATIVE
Nitrite: NEGATIVE
Protein, ur: NEGATIVE mg/dL
Specific Gravity, Urine: 1.01 (ref 1.005–1.030)
pH: 6 (ref 5.0–8.0)

## 2022-05-02 NOTE — ED Triage Notes (Signed)
The signature pad is not working in this room

## 2022-05-02 NOTE — ED Triage Notes (Signed)
The pt was sent here after a positive mri in [place] this am  memory loss    his memory loss has gotten worse in the last 6 months  no pain

## 2022-05-02 NOTE — ED Provider Triage Note (Signed)
Emergency Medicine Provider Triage Evaluation Note  Juan [name] , a 81 y.o. male  was evaluated in triage.  Pt complains of memory loss.  According to patient he had an MRI which was abnormal and showed multiple infarcts.  The MRI was prompted as patient was "very forgetful ".  No other complaints.  Review of Systems  Positive: Memory issues Negative:   Physical Exam  BP 112/65   Pulse (!) 53   Temp 97.6 F (36.4 C)   Resp 16   SpO2 99%  Gen:   Awake, no distress   Resp:  Normal effort  MSK:   Moves extremities without difficulty  Other:    Medical Decision Making  Medically screening exam initiated at 4:01 PM.  Appropriate orders placed.  Leonie Man Arth was informed that the remainder of the evaluation will be completed by another provider, this initial triage assessment does not replace that evaluation, and the importance of remaining in the ED until their evaluation is complete.     Janeece Fitting, PA-C 05/02/22 [phone]

## 2022-05-02 NOTE — ED Notes (Signed)
Pt cannot be found and not answering when being called

## 2022-05-03 DIAGNOSIS — I482 Chronic atrial fibrillation, unspecified: Secondary | ICD-10-CM | POA: Diagnosis not present

## 2022-05-03 DIAGNOSIS — I639 Cerebral infarction, unspecified: Secondary | ICD-10-CM | POA: Diagnosis not present

## 2022-05-03 DIAGNOSIS — E782 Mixed hyperlipidemia: Secondary | ICD-10-CM | POA: Diagnosis not present

## 2022-05-03 DIAGNOSIS — D485 Neoplasm of uncertain behavior of skin: Secondary | ICD-10-CM | POA: Diagnosis not present

## 2022-05-03 DIAGNOSIS — R9082 White matter disease, unspecified: Secondary | ICD-10-CM | POA: Diagnosis not present

## 2022-05-03 DIAGNOSIS — E559 Vitamin D deficiency, unspecified: Secondary | ICD-10-CM | POA: Diagnosis not present

## 2022-05-03 DIAGNOSIS — D0462 Carcinoma in situ of skin of left upper limb, including shoulder: Secondary | ICD-10-CM | POA: Diagnosis not present

## 2022-05-03 DIAGNOSIS — I2510 Atherosclerotic heart disease of native coronary artery without angina pectoris: Secondary | ICD-10-CM | POA: Diagnosis not present

## 2022-05-03 DIAGNOSIS — D2261 Melanocytic nevi of right upper limb, including shoulder: Secondary | ICD-10-CM | POA: Diagnosis not present

## 2022-05-03 DIAGNOSIS — T466X5A Adverse effect of antihyperlipidemic and antiarteriosclerotic drugs, initial encounter: Secondary | ICD-10-CM | POA: Diagnosis not present

## 2022-05-03 DIAGNOSIS — Z85828 Personal history of other malignant neoplasm of skin: Secondary | ICD-10-CM | POA: Diagnosis not present

## 2022-05-03 DIAGNOSIS — G720 Drug-induced myopathy: Secondary | ICD-10-CM | POA: Diagnosis not present

## 2022-05-03 DIAGNOSIS — D2262 Melanocytic nevi of left upper limb, including shoulder: Secondary | ICD-10-CM | POA: Diagnosis not present

## 2022-05-03 DIAGNOSIS — D2272 Melanocytic nevi of left lower limb, including hip: Secondary | ICD-10-CM | POA: Diagnosis not present

## 2022-05-03 DIAGNOSIS — L570 Actinic keratosis: Secondary | ICD-10-CM | POA: Diagnosis not present

## 2022-05-04 ENCOUNTER — Other Ambulatory Visit: Payer: Self-pay | Admitting: Internal Medicine

## 2022-05-04 DIAGNOSIS — I639 Cerebral infarction, unspecified: Secondary | ICD-10-CM

## 2022-05-22 ENCOUNTER — Ambulatory Visit
Admission: RE | Admit: 2022-05-22 | Discharge: 2022-05-22 | Disposition: A | Discharge status: Home / Self Care | Payer: PPO | Source: Ambulatory Visit | Attending: Internal Medicine | Admitting: Internal Medicine

## 2022-05-22 ENCOUNTER — Encounter (HOSPITAL_COMMUNITY): Payer: Self-pay

## 2022-05-22 ENCOUNTER — Other Ambulatory Visit (HOSPITAL_COMMUNITY): Payer: PPO

## 2022-05-22 DIAGNOSIS — I6340 Cerebral infarction due to embolism of unspecified cerebral artery: Secondary | ICD-10-CM | POA: Diagnosis not present

## 2022-05-22 DIAGNOSIS — I639 Cerebral infarction, unspecified: Secondary | ICD-10-CM

## 2022-05-22 DIAGNOSIS — I6523 Occlusion and stenosis of bilateral carotid arteries: Secondary | ICD-10-CM | POA: Diagnosis not present

## 2022-05-22 MED ORDER — GADOBENATE DIMEGLUMINE 529 MG/ML IV SOLN
15.0000 mL | Freq: Once | INTRAVENOUS | Status: AC | PRN
  Administered 2022-05-22: 15 mL via INTRAVENOUS

## 2022-05-31 DIAGNOSIS — E782 Mixed hyperlipidemia: Secondary | ICD-10-CM | POA: Diagnosis not present

## 2022-05-31 DIAGNOSIS — R9082 White matter disease, unspecified: Secondary | ICD-10-CM | POA: Diagnosis not present

## 2022-05-31 DIAGNOSIS — I482 Chronic atrial fibrillation, unspecified: Secondary | ICD-10-CM | POA: Diagnosis not present

## 2022-05-31 DIAGNOSIS — Z79899 Other long term (current) drug therapy: Secondary | ICD-10-CM | POA: Diagnosis not present

## 2022-05-31 DIAGNOSIS — I639 Cerebral infarction, unspecified: Secondary | ICD-10-CM | POA: Diagnosis not present

## 2022-06-20 ENCOUNTER — Emergency Department
Admission: EM | Admit: 2022-06-20 | Discharge: 2022-06-20 | Disposition: A | Discharge status: Home / Self Care | Payer: PPO | Attending: Emergency Medicine | Admitting: Emergency Medicine

## 2022-06-20 ENCOUNTER — Other Ambulatory Visit: Payer: Self-pay

## 2022-06-20 DIAGNOSIS — I482 Chronic atrial fibrillation, unspecified: Secondary | ICD-10-CM | POA: Insufficient documentation

## 2022-06-20 DIAGNOSIS — R0609 Other forms of dyspnea: Secondary | ICD-10-CM | POA: Insufficient documentation

## 2022-06-20 DIAGNOSIS — Z7901 Long term (current) use of anticoagulants: Secondary | ICD-10-CM | POA: Diagnosis not present

## 2022-06-20 DIAGNOSIS — I4891 Unspecified atrial fibrillation: Secondary | ICD-10-CM | POA: Diagnosis not present

## 2022-06-20 DIAGNOSIS — R531 Weakness: Secondary | ICD-10-CM | POA: Diagnosis not present

## 2022-06-20 DIAGNOSIS — N390 Urinary tract infection, site not specified: Secondary | ICD-10-CM | POA: Diagnosis not present

## 2022-06-20 DIAGNOSIS — I10 Essential (primary) hypertension: Secondary | ICD-10-CM | POA: Insufficient documentation

## 2022-06-20 LAB — COMPREHENSIVE METABOLIC PANEL
ALT: 15 U/L (ref 0–44)
AST: 36 U/L (ref 15–41)
Albumin: 3.8 g/dL (ref 3.5–5.0)
Alkaline Phosphatase: 39 U/L (ref 38–126)
Anion gap: 12 (ref 5–15)
BUN: 12 mg/dL (ref 8–23)
CO2: 27 mmol/L (ref 22–32)
Calcium: 9 mg/dL (ref 8.9–10.3)
Chloride: 95 mmol/L — ABNORMAL LOW (ref 98–111)
Creatinine, Ser: 1.16 mg/dL (ref 0.61–1.24)
GFR, Estimated: >60 mL/min (ref >60)
Glucose, Bld: 121 mg/dL — ABNORMAL HIGH (ref 70–99)
Potassium: 4.2 mmol/L (ref 3.5–5.1)
Sodium: 134 mmol/L — ABNORMAL LOW (ref 135–145)
Total Bilirubin: 1.6 mg/dL — ABNORMAL HIGH (ref 0.3–1.2)
Total Protein: 6.6 g/dL (ref 6.5–8.1)

## 2022-06-20 LAB — CBC WITH DIFFERENTIAL/PLATELET
Abs Immature Granulocytes: 0.02 10*3/uL (ref 0.00–0.07)
Basophils Absolute: 0 10*3/uL (ref 0.0–0.1)
Basophils Relative: 0 %
Eosinophils Absolute: 0.1 10*3/uL (ref 0.0–0.5)
Eosinophils Relative: 1 %
HCT: 42 % (ref 39.0–52.0)
Hemoglobin: 13.7 g/dL (ref 13.0–17.0)
Immature Granulocytes: 0 %
Lymphocytes Relative: 5 %
Lymphs Abs: 0.6 10*3/uL — ABNORMAL LOW (ref 0.7–4.0)
MCH: 32.3 pg (ref 26.0–34.0)
MCHC: 32.6 g/dL (ref 30.0–36.0)
MCV: 99.1 fL (ref 80.0–100.0)
Monocytes Absolute: 1 10*3/uL (ref 0.1–1.0)
Monocytes Relative: 9 %
Neutro Abs: 9.9 10*3/uL — ABNORMAL HIGH (ref 1.7–7.7)
Neutrophils Relative %: 85 %
Platelets: 121 10*3/uL — ABNORMAL LOW (ref 150–400)
RBC: 4.24 MIL/uL (ref 4.22–5.81)
RDW: 12.9 % (ref 11.5–15.5)
WBC: 11.6 10*3/uL — ABNORMAL HIGH (ref 4.0–10.5)
nRBC: 0 % (ref 0.0–0.2)

## 2022-06-20 LAB — URINALYSIS, ROUTINE W REFLEX MICROSCOPIC
Bilirubin Urine: NEGATIVE
Glucose, UA: NEGATIVE mg/dL
Ketones, ur: 5 mg/dL — AB
Nitrite: NEGATIVE
Protein, ur: 30 mg/dL — AB
Specific Gravity, Urine: 1.008 (ref 1.005–1.030)
WBC, UA: >50 WBC/hpf — ABNORMAL HIGH (ref 0–5)
pH: 8 (ref 5.0–8.0)

## 2022-06-20 LAB — TROPONIN I (HIGH SENSITIVITY): Troponin I (High Sensitivity): 36 ng/L — ABNORMAL HIGH (ref <18)

## 2022-06-20 LAB — BRAIN NATRIURETIC PEPTIDE: B Natriuretic Peptide: 551.8 pg/mL — ABNORMAL HIGH (ref 0.0–100.0)

## 2022-06-20 MED ORDER — DILTIAZEM HCL-DEXTROSE 125-5 MG/125ML-% IV SOLN (PREMIX): 5.0000 mg/h | INTRAVENOUS | Status: DC

## 2022-06-20 MED ORDER — SODIUM CHLORIDE 0.9 % IV BOLUS
1000.0000 mL | Freq: Once | INTRAVENOUS | Status: AC
  Administered 2022-06-20: 1000 mL via INTRAVENOUS

## 2022-06-20 MED ORDER — CEFDINIR 300 MG PO CAPS: 300.0000 mg | ORAL_CAPSULE | Freq: Two times a day (BID) | ORAL | 0 refills | Status: AC

## 2022-06-20 MED ORDER — METOPROLOL TARTRATE 25 MG PO TABS
25.0000 mg | ORAL_TABLET | Freq: Once | ORAL | Status: AC
  Administered 2022-06-20: 25 mg via ORAL
  Filled 2022-06-20: qty 1

## 2022-06-20 MED ORDER — METOPROLOL TARTRATE 5 MG/5ML IV SOLN
5.0000 mg | Freq: Once | INTRAVENOUS | Status: AC
Start: 2022-06-20 — End: 2022-06-20
  Administered 2022-06-20: 5 mg via INTRAVENOUS
  Filled 2022-06-20: qty 5

## 2022-06-20 NOTE — ED Notes (Signed)
EDP states will d/c cardizem drip and possibly give other med or higher metoprolol dosage.

## 2022-06-20 NOTE — ED Triage Notes (Signed)
Pt here with weakness x2 days. Pt currently in a-fib RVR with a rate of 120.

## 2022-06-20 NOTE — ED Notes (Signed)
EDP at bedside. Pt to ED for weakness since yesterday AM. Pt denies new strokelike symptoms or focal neuro symptoms. Wife states pt has had memory problems since CVA in 7/23. Pt to ED for a fib RVR, pt denies dizziness, chest pain, SOB at this time.

## 2022-06-20 NOTE — ED Provider Notes (Signed)
[facility] Provider Note   Event Date/Time   First MD Initiated Contact with Patient 06/20/22 1157     (approximate) History  Weakness  HPI Juan [name] a 81 y.o. male with a stated past medical history of recent CVA with no residual [name], Juan [name], Juan [name] also concerned as when he took his blood pressure today his heart rate was in the 120s/130s despite taking his normally prescribed medications including digoxin Juan metoprolol.  Patient also endorses some dyspnea on exertion without any definitive chest pain. ROS: Patient currently denies any vision changes, tinnitus, difficulty speaking, facial droop, sore throat, abdominal pain, nausea/vomiting/diarrhea, dysuria, or weakness/numbness/paresthesias in any extremity   Physical Exam  Triage Vital Signs: ED Triage Vitals  Enc Vitals Group     BP 06/20/22 1200 115/87     Pulse Rate 06/20/22 1154 66     Resp 06/20/22 1154 20     Temp 06/20/22 1154 98.8 F (37.1 C)     Temp Source 06/20/22 1154 Oral     SpO2 06/20/22 1154 97 %     Weight 06/20/22 1155 147 lb 14.9 oz (67.1 kg)     Height 06/20/22 1155 '5\' 9"'$  (1.753 m)     Head Circumference --      Peak Flow --      Pain Score 06/20/22 1155 0     Pain Loc --      Pain Edu? --      Excl. in [place]? --    Most recent vital signs: Vitals:   06/20/22 1230 06/20/22 1335  BP: 126/79   Pulse: 89 (!) 101  Resp: (!) 21   Temp:    SpO2: 100%    General: Awake, oriented x4. CV:  Good peripheral perfusion.  Resp:  Normal effort.  Irregularly irregular pulse Abd:  No distention.  Other:  Elderly Caucasian male laying in bed in no acute distress ED Results / Procedures / Treatments  Labs (all labs ordered are listed, but only abnormal results are displayed) Labs Reviewed  COMPREHENSIVE METABOLIC PANEL - Abnormal; Notable  for the following components:      Result Value   Sodium 134 (*)    Chloride 95 (*)    Glucose, Bld 121 (*)    Total Bilirubin 1.6 (*)    All other components within normal limits  CBC WITH DIFFERENTIAL/PLATELET - Abnormal; Notable for the following components:   WBC 11.6 (*)    Platelets 121 (*)    Neutro Abs 9.9 (*)    Lymphs Abs 0.6 (*)    All other components within normal limits  URINALYSIS, ROUTINE W REFLEX MICROSCOPIC - Abnormal; Notable for the following components:   Color, Urine YELLOW (*)    APPearance CLOUDY (*)    Hgb urine dipstick MODERATE (*)    Ketones, ur 5 (*)    Protein, ur 30 (*)    Leukocytes,Ua LARGE (*)    WBC, UA >50 (*)    Bacteria, UA RARE (*)    All other components within normal limits  TROPONIN I (HIGH SENSITIVITY) - Abnormal; Notable for the following components:   Troponin I (High Sensitivity) 36 (*)    All other components within normal limits  BRAIN NATRIURETIC PEPTIDE  TROPONIN I (HIGH SENSITIVITY)   EKG ED ECG REPORT I, Naaman Plummer, the attending physician, personally viewed  Juan interpreted this ECG. Date: 06/20/2022 EKG Time: 1152 Rate: 120 Rhythm: Juan fibrillation with rapid ventricular response QRS Axis: normal Intervals: normal ST/T Wave abnormalities: normal Narrative Interpretation: Juan fibrillation with rapid ventricular response.  No evidence of acute ischemia PROCEDURES: Critical Care performed: Yes, see critical care procedure note(s) .1-3 Lead EKG Interpretation  Performed by: Naaman Plummer, MD Authorized by: Naaman Plummer, MD     Interpretation: normal     ECG rate:  92   ECG rate assessment: normal     Rhythm: Juan fibrillation     Ectopy: none     Conduction: normal   CRITICAL CARE Performed by: Naaman Plummer  Total critical care time: 33 minutes  Critical care time was exclusive of separately billable procedures Juan treating other patients.  Critical care was necessary to treat or prevent  imminent or life-threatening deterioration.  Critical care was time spent personally by me on the following activities: development of treatment plan with patient Juan/or surrogate as well as nursing, discussions with consultants, evaluation of patient's response to treatment, examination of patient, obtaining history from patient or surrogate, ordering Juan performing treatments Juan interventions, ordering Juan review of laboratory studies, ordering Juan review of radiographic studies, pulse oximetry Juan re-evaluation of patient's condition.  MEDICATIONS ORDERED IN ED: Medications  sodium chloride 0.9 % bolus 1,000 mL (1,000 mLs Intravenous New Bag/Given 06/20/22 1209)  metoprolol tartrate (LOPRESSOR) injection 5 mg (5 mg Intravenous Given 06/20/22 1236)  metoprolol tartrate (LOPRESSOR) tablet 25 mg (25 mg Oral Given 06/20/22 1335)   IMPRESSION / MDM / ASSESSMENT Juan PLAN / ED COURSE  I reviewed the triage vital signs Juan the nursing notes.                             The patient [name] on the cardiac monitor to evaluate for evidence of arrhythmia Juan/or significant heart rate changes. Patient's presentation [name] most consistent with acute presentation with potential threat to life or bodily function. + Juan fibrillation w/ RVR DDx: Pneumothorax, Pneumonia, Pulmonary Embolus, Tamponade, ACS, Thyrotoxicosis.  No history or evidence decompensated heart failure. Given their history Juan exam it [name] likely this patient [name] unlikely to spontaneously revert to a rate controlled rhythm Juan necessitates a thorough workup for their arrhythmia. Workup: ECG, CXR, CBC, BMP, UA, Troponin, BNP, TSH, Ca-Mag-Phos Interventions: Defer Cardioversion (uncertain historical reliability with time of onset, increased risk of thromboembolic stroke).  Start diltiazem bolus Juan drip  Reassessment: Patient maintained rate controlled Juan fibrillation during multi-hour observation in ED. Disposition: Discharge home with prompt PCP  follow up Juan cardiology referral.    FINAL CLINICAL IMPRESSION(S) / ED DIAGNOSES   Final diagnoses:  Chronic Juan fibrillation ([place])  Generalized weakness  Lower urinary tract infectious disease   Rx / DC Orders   ED Discharge Orders          Ordered    cefdinir (OMNICEF) 300 MG capsule  2 times daily        06/20/22 1358           Note:  This document was prepared using Dragon voice recognition software Juan may include unintentional dictation errors.   Naaman Plummer, MD 06/20/22 [phone]

## 2022-06-20 NOTE — Discharge Instructions (Signed)
For tonight, please take your normal dose of 25 mg metoprolol by mouth.  Tomorrow morning please start taking 50 mg of metoprolol in the morning and 25 mg at night.  Please follow-up with your cardiologist within the next 1-3 days for further management of these medications.

## 2022-06-25 DIAGNOSIS — R399 Unspecified symptoms and signs involving the genitourinary system: Secondary | ICD-10-CM | POA: Diagnosis not present

## 2022-07-09 DIAGNOSIS — L905 Scar conditions and fibrosis of skin: Secondary | ICD-10-CM | POA: Diagnosis not present

## 2022-07-09 DIAGNOSIS — D0462 Carcinoma in situ of skin of left upper limb, including shoulder: Secondary | ICD-10-CM | POA: Diagnosis not present

## 2022-07-10 DIAGNOSIS — Z23 Encounter for immunization: Secondary | ICD-10-CM | POA: Diagnosis not present

## 2022-07-10 DIAGNOSIS — E559 Vitamin D deficiency, unspecified: Secondary | ICD-10-CM | POA: Diagnosis not present

## 2022-07-10 DIAGNOSIS — I480 Paroxysmal atrial fibrillation: Secondary | ICD-10-CM | POA: Diagnosis not present

## 2022-07-10 DIAGNOSIS — Z8673 Personal history of transient ischemic attack (TIA), and cerebral infarction without residual deficits: Secondary | ICD-10-CM | POA: Diagnosis not present

## 2022-07-10 DIAGNOSIS — M21372 Foot drop, left foot: Secondary | ICD-10-CM | POA: Diagnosis not present

## 2022-07-10 DIAGNOSIS — T466X5A Adverse effect of antihyperlipidemic and antiarteriosclerotic drugs, initial encounter: Secondary | ICD-10-CM | POA: Diagnosis not present

## 2022-07-10 DIAGNOSIS — R7309 Other abnormal glucose: Secondary | ICD-10-CM | POA: Diagnosis not present

## 2022-07-10 DIAGNOSIS — G3184 Mild cognitive impairment, so stated: Secondary | ICD-10-CM | POA: Diagnosis not present

## 2022-07-10 DIAGNOSIS — G720 Drug-induced myopathy: Secondary | ICD-10-CM | POA: Diagnosis not present

## 2022-07-10 DIAGNOSIS — E7800 Pure hypercholesterolemia, unspecified: Secondary | ICD-10-CM | POA: Diagnosis not present

## 2022-07-10 DIAGNOSIS — Z79899 Other long term (current) drug therapy: Secondary | ICD-10-CM | POA: Diagnosis not present

## 2022-07-12 DIAGNOSIS — L120 Bullous pemphigoid: Secondary | ICD-10-CM | POA: Diagnosis not present

## 2022-08-02 DIAGNOSIS — L120 Bullous pemphigoid: Secondary | ICD-10-CM | POA: Diagnosis not present

## 2022-08-02 DIAGNOSIS — Z79631 Long term (current) use of antimetabolite agent: Secondary | ICD-10-CM | POA: Diagnosis not present

## 2022-09-11 DIAGNOSIS — Z9889 Other specified postprocedural states: Secondary | ICD-10-CM | POA: Diagnosis not present

## 2022-09-11 DIAGNOSIS — I2510 Atherosclerotic heart disease of native coronary artery without angina pectoris: Secondary | ICD-10-CM | POA: Diagnosis not present

## 2022-09-11 DIAGNOSIS — Z951 Presence of aortocoronary bypass graft: Secondary | ICD-10-CM | POA: Diagnosis not present

## 2022-09-11 DIAGNOSIS — I482 Chronic atrial fibrillation, unspecified: Secondary | ICD-10-CM | POA: Diagnosis not present

## 2022-09-11 DIAGNOSIS — E7800 Pure hypercholesterolemia, unspecified: Secondary | ICD-10-CM | POA: Diagnosis not present

## 2022-09-11 DIAGNOSIS — I5020 Unspecified systolic (congestive) heart failure: Secondary | ICD-10-CM | POA: Diagnosis not present

## 2022-09-11 DIAGNOSIS — I059 Rheumatic mitral valve disease, unspecified: Secondary | ICD-10-CM | POA: Diagnosis not present

## 2022-09-25 DIAGNOSIS — Z951 Presence of aortocoronary bypass graft: Secondary | ICD-10-CM | POA: Diagnosis not present

## 2022-09-25 DIAGNOSIS — I5020 Unspecified systolic (congestive) heart failure: Secondary | ICD-10-CM | POA: Diagnosis not present

## 2022-10-11 DIAGNOSIS — R7309 Other abnormal glucose: Secondary | ICD-10-CM | POA: Diagnosis not present

## 2022-10-11 DIAGNOSIS — Z79899 Other long term (current) drug therapy: Secondary | ICD-10-CM | POA: Diagnosis not present

## 2022-10-11 DIAGNOSIS — E7800 Pure hypercholesterolemia, unspecified: Secondary | ICD-10-CM | POA: Diagnosis not present

## 2022-10-17 DIAGNOSIS — N1831 Chronic kidney disease, stage 3a: Secondary | ICD-10-CM | POA: Diagnosis not present

## 2022-10-17 DIAGNOSIS — E782 Mixed hyperlipidemia: Secondary | ICD-10-CM | POA: Diagnosis not present

## 2022-10-17 DIAGNOSIS — I5020 Unspecified systolic (congestive) heart failure: Secondary | ICD-10-CM | POA: Diagnosis not present

## 2022-10-17 DIAGNOSIS — I2510 Atherosclerotic heart disease of native coronary artery without angina pectoris: Secondary | ICD-10-CM | POA: Diagnosis not present

## 2022-10-17 DIAGNOSIS — I480 Paroxysmal atrial fibrillation: Secondary | ICD-10-CM | POA: Diagnosis not present

## 2022-10-17 DIAGNOSIS — Z0000 Encounter for general adult medical examination without abnormal findings: Secondary | ICD-10-CM | POA: Diagnosis not present

## 2022-10-17 DIAGNOSIS — R9082 White matter disease, unspecified: Secondary | ICD-10-CM | POA: Diagnosis not present

## 2022-10-17 DIAGNOSIS — R7303 Prediabetes: Secondary | ICD-10-CM | POA: Diagnosis not present

## 2022-10-17 DIAGNOSIS — T466X5A Adverse effect of antihyperlipidemic and antiarteriosclerotic drugs, initial encounter: Secondary | ICD-10-CM | POA: Diagnosis not present

## 2022-10-17 DIAGNOSIS — G720 Drug-induced myopathy: Secondary | ICD-10-CM | POA: Diagnosis not present

## 2022-10-17 DIAGNOSIS — G4733 Obstructive sleep apnea (adult) (pediatric): Secondary | ICD-10-CM | POA: Diagnosis not present

## 2022-10-17 DIAGNOSIS — E559 Vitamin D deficiency, unspecified: Secondary | ICD-10-CM | POA: Diagnosis not present

## 2022-10-17 DIAGNOSIS — I517 Cardiomegaly: Secondary | ICD-10-CM | POA: Diagnosis not present

## 2022-10-30 DIAGNOSIS — G3184 Mild cognitive impairment, so stated: Secondary | ICD-10-CM | POA: Diagnosis not present

## 2022-10-30 DIAGNOSIS — Z8673 Personal history of transient ischemic attack (TIA), and cerebral infarction without residual deficits: Secondary | ICD-10-CM | POA: Diagnosis not present

## 2022-10-30 DIAGNOSIS — R5383 Other fatigue: Secondary | ICD-10-CM | POA: Diagnosis not present

## 2022-10-30 DIAGNOSIS — D649 Anemia, unspecified: Secondary | ICD-10-CM | POA: Diagnosis not present

## 2022-10-30 DIAGNOSIS — M21372 Foot drop, left foot: Secondary | ICD-10-CM | POA: Diagnosis not present

## 2022-10-30 DIAGNOSIS — G250 Essential tremor: Secondary | ICD-10-CM | POA: Diagnosis not present

## 2022-10-30 DIAGNOSIS — E441 Mild protein-calorie malnutrition: Secondary | ICD-10-CM | POA: Diagnosis not present

## 2022-11-05 DIAGNOSIS — D0439 Carcinoma in situ of skin of other parts of face: Secondary | ICD-10-CM | POA: Diagnosis not present

## 2022-11-05 DIAGNOSIS — Z85828 Personal history of other malignant neoplasm of skin: Secondary | ICD-10-CM | POA: Diagnosis not present

## 2022-11-05 DIAGNOSIS — B078 Other viral warts: Secondary | ICD-10-CM | POA: Diagnosis not present

## 2022-11-05 DIAGNOSIS — D2261 Melanocytic nevi of right upper limb, including shoulder: Secondary | ICD-10-CM | POA: Diagnosis not present

## 2022-11-05 DIAGNOSIS — D485 Neoplasm of uncertain behavior of skin: Secondary | ICD-10-CM | POA: Diagnosis not present

## 2022-11-05 DIAGNOSIS — D2262 Melanocytic nevi of left upper limb, including shoulder: Secondary | ICD-10-CM | POA: Diagnosis not present

## 2022-11-05 DIAGNOSIS — D225 Melanocytic nevi of trunk: Secondary | ICD-10-CM | POA: Diagnosis not present

## 2022-11-05 DIAGNOSIS — L570 Actinic keratosis: Secondary | ICD-10-CM | POA: Diagnosis not present

## 2022-11-05 DIAGNOSIS — D2272 Melanocytic nevi of left lower limb, including hip: Secondary | ICD-10-CM | POA: Diagnosis not present

## 2022-11-08 DIAGNOSIS — Z79631 Long term (current) use of antimetabolite agent: Secondary | ICD-10-CM | POA: Diagnosis not present

## 2022-11-08 DIAGNOSIS — L120 Bullous pemphigoid: Secondary | ICD-10-CM | POA: Diagnosis not present

## 2022-11-22 DIAGNOSIS — H6123 Impacted cerumen, bilateral: Secondary | ICD-10-CM | POA: Diagnosis not present

## 2022-11-22 DIAGNOSIS — H903 Sensorineural hearing loss, bilateral: Secondary | ICD-10-CM | POA: Diagnosis not present

## 2023-01-08 DIAGNOSIS — D0439 Carcinoma in situ of skin of other parts of face: Secondary | ICD-10-CM | POA: Diagnosis not present

## 2023-01-18 DIAGNOSIS — E782 Mixed hyperlipidemia: Secondary | ICD-10-CM | POA: Diagnosis not present

## 2023-01-18 DIAGNOSIS — N401 Enlarged prostate with lower urinary tract symptoms: Secondary | ICD-10-CM | POA: Diagnosis not present

## 2023-01-18 DIAGNOSIS — Z79899 Other long term (current) drug therapy: Secondary | ICD-10-CM | POA: Diagnosis not present

## 2023-01-18 DIAGNOSIS — R7303 Prediabetes: Secondary | ICD-10-CM | POA: Diagnosis not present

## 2023-01-18 DIAGNOSIS — N138 Other obstructive and reflux uropathy: Secondary | ICD-10-CM | POA: Diagnosis not present

## 2023-01-18 DIAGNOSIS — T466X5A Adverse effect of antihyperlipidemic and antiarteriosclerotic drugs, initial encounter: Secondary | ICD-10-CM | POA: Diagnosis not present

## 2023-01-18 DIAGNOSIS — E559 Vitamin D deficiency, unspecified: Secondary | ICD-10-CM | POA: Diagnosis not present

## 2023-01-18 DIAGNOSIS — I5020 Unspecified systolic (congestive) heart failure: Secondary | ICD-10-CM | POA: Diagnosis not present

## 2023-01-18 DIAGNOSIS — I482 Chronic atrial fibrillation, unspecified: Secondary | ICD-10-CM | POA: Diagnosis not present

## 2023-01-18 DIAGNOSIS — G720 Drug-induced myopathy: Secondary | ICD-10-CM | POA: Diagnosis not present

## 2023-01-22 ENCOUNTER — Ambulatory Visit: Payer: PPO | Attending: Neurology | Admitting: Physical Therapy

## 2023-01-22 ENCOUNTER — Encounter: Payer: Self-pay | Admitting: Physical Therapy

## 2023-01-22 DIAGNOSIS — R2689 Other abnormalities of gait and mobility: Secondary | ICD-10-CM | POA: Diagnosis not present

## 2023-01-22 DIAGNOSIS — M6281 Muscle weakness (generalized): Secondary | ICD-10-CM | POA: Diagnosis not present

## 2023-01-22 DIAGNOSIS — R262 Difficulty in walking, not elsewhere classified: Secondary | ICD-10-CM | POA: Diagnosis not present

## 2023-01-22 NOTE — Therapy (Incomplete)
OUTPATIENT PHYSICAL THERAPY BALANCE EVALUATION   Patient Name: Juan [name] MRN: [id number] DOB:[birth year], 82 y.o., male Today's Date: 01/22/2023   PT End of Session - 01/22/23 0949     Visit Number 1    Number of Visits 21    Date for PT Re-Evaluation 04/02/23    Authorization Type HTA 2024 - visit limit based on medical necessity    Authorization Time Period IE 01/22/23    Progress Note Due on Visit 10    PT Start Time 0953    PT Stop Time 1033    PT Time Calculation (min) 40 min    Equipment Utilized During Treatment Gait belt    Activity Tolerance Patient tolerated treatment well    Behavior During Therapy WFL for tasks assessed/performed             Past Medical History:  Diagnosis Date   Arthritis    back- low   Atrial fibrillation (HCC)    Cancer (HCC)    skin ( basal) cell , face, head, back, inside L ear   Coronary artery disease    Dysrhythmia    MI (myocardial infarction) (HCC)    Sleep apnea    +Cpap in use nightly , last study- 10 yrs. ago   Stroke [facility])    "light stroke"   Past Surgical History:  Procedure Laterality Date   APPENDECTOMY     BACK SURGERY  09/2011   lumbar fusion    CARDIAC CATHETERIZATION     CARDIAC SURGERY     CARDIAC VALVE REPLACEMENT     CORONARY ARTERY BYPASS GRAFT     EYE SURGERY Bilateral    catarascts removed-    HERNIA REPAIR Bilateral 1946, 1990's   inguinal x3 procedures    LUMBAR LAMINECTOMY/DECOMPRESSION MICRODISCECTOMY Left 08/14/2019   Procedure: Laminectomy for facet/synovial cyst - left - Lumbar three-Lumbar four;  Surgeon: Julio Sicks, MD;  Location: [facility] OR;  Service: Neurosurgery;  Laterality: Left;   TONSILLECTOMY     Patient Active Problem List   Diagnosis Date Noted   Synovial cyst of lumbar facet joint 08/14/2019    PCP: Marguarite Arbour, MD  REFERRING PROVIDER: Lonell Face, MD  REFERRING DIAGNOSIS:  R53.83 (ICD-10-CM) - Other fatigue     THERAPY DIAG: Imbalance  Difficulty in  walking, not elsewhere classified  Muscle weakness (generalized)  ONSET DATE: 03/2022 (small stroke in Summer 2023)  FOLLOW UP APPT WITH PROVIDER: Yes , pt following up with referring office in 6 months per referring provider's paperwork   RATIONALE FOR EVALUATION AND TREATMENT: Rehabilitation  SUBJECTIVE:  Chief Complaint: Patient is an 82 year old male referred for balance and foot drop. Pt was previously seen in PT for foot drop in 2022 at [place] office.   Pertinent History Patient is an 82 year old male referred for balance and foot drop. 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 Patient's wife reports that patient had small stroke last summer. He has more difficulty with memory. She states he walks more slowly and his wife is concerned he might fall. They report trouble with getting up and down from chair. He reports more "wobbling" and being more unsteady with gait. His wife reports that his "heart is weak" - pt has Hx of CAD, A-fib, and MI. No unexplained weight loss.    Pain: No Numbness/Tingling: Yes; no loss of sensation, intermittent paresthesias in arms/legs, no neuropathy  Focal Weakness: No Recent changes in overall health/medication: Yes, stroke this previous year  Prior history of physical therapy for balance:  Yes, PT in 2022  Falls: Has patient fallen in last 6 months? No, Number of falls: - Directional pattern for falls: Yes, listing to side during ambulation  Dominant hand: left  Imaging: Yes;  MRI Brain without contrast 05/02/2022 - 1. Diffusion images show several tiny infarctions which appear acute/subacute and multiplicity and bilaterality suggests embolic phenomenon.  2. Old infarctions as described above 3. Negative for hemorrhage. 4. Atrophy 5.  Cerebellar tonsillar ectopia. Negative for syrinx     Prior level of function: Independent Occupational demands: Retired  Presenter, broadcasting: yard work  [employer] (bowel/bladder changes, saddle paresthesia, personal history of cancer, h/o spinal tumors, h/o compression fx, h/o abdominal aneurysm, abdominal pain, chills/fever, night sweats, nausea, vomiting, unrelenting pain): Negative  Precautions: None  Weight Bearing Restrictions: No  Living Environment Lives with: lives with their spouse;  Lives in: House/apartment. One-level home. 2-3 steps to get into door (handrails on both sides).  Has following equipment at home: shower chair, Grab bars, and raised commode seat  -Flat concrete terrain getting up to front door    Patient Goals: More steady, pt wants to get stronger     OBJECTIVE:   Patient Surveys  FOTO: 48, predicted improvement to 28    Cognition Patient is oriented to person, place, and time.  Recent memory is impaired.  Remote memory is impaired.  Attention span and concentration are intact.  Expressive speech is intact.  Patient's fund of knowledge is within normal limits for educational level.     Gross Musculoskeletal Assessment Tremor: None Bulk: Normal Tone: Normal   GAIT: Distance walked: 40 ft  Assistive device utilized: None Level of assistance: SBA Comments: L eccentric dorsiflexion control is poor following heel strike, shortened single-limb support time; normal step/stride length and good cadence   Posture: FHRS posture, posterior pelvic tilt   AROM  AROM (Normal range in degrees) AROM  01/23/2023  Ankle    Dorsiflexion (20) 15 5  Plantarflexion (50) WNL WNL  Inversion (35)    Eversion (15    (* = pain; Blank rows = not tested)   LE MMT:  MMT (out of 5) Right 01/23/2023 Left 01/23/2023  Hip flexion 4- 4-  Hip extension    Hip abduction (seated) 4 4  Hip adduction (seated)  4 4  Hip internal rotation    Hip external rotation    Knee  flexion 4+ 4  Knee extension 5- 4  Ankle dorsiflexion 4- 3+  Ankle plantarflexion    Ankle inversion    Ankle eversion    (* =  pain; Blank rows = not tested)   Sensation Grossly intact to light touch bilateral LEs as determined by testing dermatomes L2-S2, only lacking in L medial malleolus. Proprioception, and hot/cold testing deferred on this date.   Reflexes Deferred   Cranial Nerves Visual acuity and visual fields are intact  Extraocular muscles are intact  Facial sensation is intact bilaterally  Facial strength is intact bilaterally  Hearing is normal as tested by gross conversation Palate elevates midline, normal phonation  Shoulder shrug strength is intact  Tongue protrudes midline   Coordination/Cerebellar Finger to Nose: Intermittent dysmetria Heel to Shin: WNL Rapid alternating movements: Impaired UE, LE impaired (also affected by L foot drop)  Finger Opposition: WNL Pronator Drift: Positive R>L   FUNCTIONAL OUTCOME MEASURES   Results Comments  BERG Next visit/56 Fall risk, in need of intervention  DGI Next visit/24   TUG 11 seconds   5TSTS Unable to perform sit to stand without UE support   10 Meter Gait Speed Self-selected: s = m/s; Fastest: s = m/s Below normative values for full community ambulation  (Blank rows = not tested)    TODAY'S TREATMENT    Therapeutic Exercise - for HEP establishment, discussion on appropriate exercise/activity modification, PT education  Patient education on current condition, role of PT, prognosis, plan of care. Discussed strategies for home safety/reducing fall risk and provided CDC handout to patient/spouse concerning home safety. We reviewed baseline home exercises and provided [employer] handout (see program below); verbal cueing and therapist demonstration to ensure understanding of exercise technique.     PATIENT EDUCATION:  Education details: see above for patient education details Person educated: Patient and  Spouse Education method: Explanation, Demonstration, and Handouts Education comprehension: verbalized understanding   HOME EXERCISE PROGRAM: Access Code: [email or web address] URL: [email or web address] Date: 01/23/2023 Prepared by: Consuela Mimes  Exercises - Sit to Stand with Weight  - 2 x daily - 7 x weekly - 2-3 sets - 5-6 reps - Heel Toe Raises with Counter Support  - 2 x daily - 7 x weekly - 2 sets - 10 reps - Standing Hip Abduction with Counter Support  - 2 x daily - 7 x weekly - 2 sets - 10 reps  Patient Education - What You Can Do to Prevent Falls   ASSESSMENT:  CLINICAL IMPRESSION: Patient is an 82 y.o. male who was seen today for physical therapy evaluation and treatment for imbalance and L foot drop. Patient has Hx of L foot drop stemming from lumbar radiculopathy - Hx of previous laminectomy/decompression and lumbar fusion. Pt had evidence of infarcts on MRI in August of this past year. Pt has had progressively worsening balance and memory impairment. Patient has good TUG performance; he is unable to perform 5-times sit to stand due to difficulty with single sit to stand without upper limb support. Pt has Hx of gait instability with tendency to deviate L>R during straight line walking; poor eccentric control of L ankle dorsiflexors following heel strike. Objective impairments include Abnormal gait, decreased balance, decreased cognition, decreased coordination, difficulty walking, decreased strength. These impairments are limiting patient from shopping, community activity, and yard work. Personal factors including Age, Past/current experiences, Time since onset of injury/illness/exacerbation, and 3+ comorbidities: (CVD, Hx of stroke, CVD, Hx of MI, HTN)  are also affecting patient's functional outcome. Patient will benefit from skilled PT to address above impairments and improve overall function.  REHAB POTENTIAL: Good  CLINICAL DECISION MAKING: Evolving/moderate  complexity  EVALUATION COMPLEXITY: Moderate   GOALS: Goals  reviewed with patient? No  SHORT TERM GOALS: Target date: 02/13/2023  Pt will be independent with HEP in order to improve strength and balance in order to decrease fall risk and improve function at home. Baseline: 01/22/23: Baseline HEP initiated Goal status: INITIAL  Pt will perform independent sit to stand without UE support and symmetrical weightbearing indicative of improved functional LE strength and ability to perform transferring Baseline: 01/22/23: Multiple attempts to perform sit to stand without push on armrests, unable to perform until patient pushed onto anterior thighs  Goal status: INITIAL   LONG TERM GOALS: Target date: 04/03/2023  Pt will increase FOTO to at least 57 to demonstrate significant improvement in function at home related to balance  Baseline: 01/22/23: 48 Goal status: INITIAL  2.  Pt will improve BERG by at least 3 points in order to demonstrate clinically significant improvement in balance.   Baseline: 01/22/23: Baseline to be performed on visit # 2.  Goal status: INITIAL  3. Pt will perform 5TSTS in less than 12 seconds to demonstrate clinically significant improvement in LE strength      Baseline:  01/22/23: Baseline to be performed as sit to stand improves.  Goal status: INITIAL  5. Pt will improve DGI by at least 3 points in order to demonstrate clinically significant improvement in balance and decreased risk for falls.     Baseline: 01/22/23: Baseline to be performed on visit # 2.  Goal status: INITIAL   PLAN: PT FREQUENCY: 2x/week  PT DURATION: 12 weeks  PLANNED INTERVENTIONS: Therapeutic exercises, Therapeutic activity, Neuromuscular re-education, Balance training, Gait training, Patient/Family education, Joint manipulation, Joint mobilization, Canalith repositioning, Aquatic Therapy, Dry Needling, Cognitive remediation, Electrical stimulation, Spinal manipulation, Spinal mobilization,  Cryotherapy, Moist heat, Traction, Ultrasound, Ionotophoresis 4mg /ml Dexamethasone, and Manual therapy  PLAN FOR NEXT SESSION: Test BERG, DGI, mCTSIB. Continue with LE strengthening, strategies to improve active dorsiflexion/toe clearance, balance training.     Consuela Mimes, PT, DPT #[id number]  Gertie Exon 01/23/2023, 8:46 AM

## 2023-01-23 NOTE — Addendum Note (Signed)
Addended by: Consuela Mimes T on: 01/23/2023 11:06 AM   Modules accepted: Orders

## 2023-01-24 ENCOUNTER — Ambulatory Visit: Payer: PPO | Attending: Neurology | Admitting: Physical Therapy

## 2023-01-24 ENCOUNTER — Encounter: Payer: Self-pay | Admitting: Physical Therapy

## 2023-01-24 DIAGNOSIS — M6281 Muscle weakness (generalized): Secondary | ICD-10-CM | POA: Diagnosis not present

## 2023-01-24 DIAGNOSIS — R262 Difficulty in walking, not elsewhere classified: Secondary | ICD-10-CM | POA: Diagnosis not present

## 2023-01-24 DIAGNOSIS — R2689 Other abnormalities of gait and mobility: Secondary | ICD-10-CM | POA: Diagnosis not present

## 2023-01-24 NOTE — Therapy (Signed)
OUTPATIENT PHYSICAL THERAPY TREATMENT NOTE   Patient Name: Juan [name] MRN: [id number] DOB:[birth year], 82 y.o., male Today's Date: 01/24/2023  PCP: Marguarite Arbour, MD REFERRING PROVIDER: Lonell Face, MD  END OF SESSION:   PT End of Session - 01/24/23 1243     Visit Number 2    Number of Visits 21    Date for PT Re-Evaluation 04/02/23    Authorization Type HTA 2024 - visit limit based on medical necessity    Authorization Time Period IE 01/22/23    Progress Note Due on Visit 10    PT Start Time 0949    PT Stop Time 1030    PT Time Calculation (min) 41 min    Equipment Utilized During Treatment Gait belt    Activity Tolerance Patient tolerated treatment well    Behavior During Therapy WFL for tasks assessed/performed             Past Medical History:  Diagnosis Date   Arthritis    back- low   Atrial fibrillation (HCC)    Cancer (HCC)    skin ( basal) cell , face, head, back, inside L ear   Coronary artery disease    Dysrhythmia    MI (myocardial infarction) (HCC)    Sleep apnea    +Cpap in use nightly , last study- 10 yrs. 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"   Past Surgical History:  Procedure Laterality Date   APPENDECTOMY     BACK SURGERY  09/2011   lumbar fusion    CARDIAC CATHETERIZATION     CARDIAC SURGERY     CARDIAC VALVE REPLACEMENT     CORONARY ARTERY BYPASS GRAFT     EYE SURGERY Bilateral    catarascts removed-    HERNIA REPAIR Bilateral 1946, 1990's   inguinal x3 procedures    LUMBAR LAMINECTOMY/DECOMPRESSION MICRODISCECTOMY Left 08/14/2019   Procedure: Laminectomy for facet/synovial cyst - left - Lumbar three-Lumbar four;  Surgeon: Julio Sicks, MD;  Location: [facility] OR;  Service: Neurosurgery;  Laterality: Left;   TONSILLECTOMY     Patient Active Problem List   Diagnosis Date Noted   Synovial cyst of lumbar facet joint 08/14/2019    REFERRING DIAG:  R53.83 (ICD-10-CM) - Other fatigue     THERAPY DIAG:   Imbalance  Difficulty in walking, not elsewhere classified  Muscle weakness (generalized)  Rationale for Evaluation and Treatment Rehabilitation  PERTINENT HISTORY: Patient is an 82 year old male referred for balance and foot drop. 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 Patient's wife reports that patient had small stroke last summer. He has more difficulty with memory. She states he walks more slowly and his wife is concerned he might fall. They report trouble with getting up and down from chair. He reports more "wobbling" and being more unsteady with gait. His wife reports that his "heart is weak" - pt has Hx of CAD, A-fib, and MI. No unexplained weight loss.      Pain: No Numbness/Tingling: Yes; no loss of sensation, intermittent paresthesias in arms/legs, no neuropathy  Focal Weakness: No Recent changes in overall health/medication: Yes, stroke this previous year  Prior history of physical therapy for balance:  Yes, PT in 2022  Falls: Has patient fallen in last 6 months? No, Number of falls: - Directional pattern for falls: Yes, listing to side during ambulation  Dominant hand: left   Imaging: Yes;   MRI Brain without contrast 05/02/2022 - 1. Diffusion  images show several tiny infarctions which appear acute/subacute and multiplicity and bilaterality suggests embolic phenomenon.  2. Old infarctions as described above 3. Negative for hemorrhage. 4. Atrophy 5. Cerebellar tonsillar ectopia. Negative for syrinx        Prior level of function: Independent Occupational demands: Retired  Presenter, broadcasting: yard work  [employer] (bowel/bladder changes, saddle paresthesia, personal history of cancer, h/o spinal tumors, h/o compression fx, h/o abdominal aneurysm, abdominal pain, chills/fever, night sweats, nausea, vomiting, unrelenting pain): Negative   Precautions: None   Weight Bearing Restrictions: No   Living Environment Lives with: lives with their spouse;   Lives in: House/apartment. One-level home. 2-3 steps to get into door (handrails on both sides).  Has following equipment at home: shower chair, Grab bars, and raised commode seat  -Flat concrete terrain getting up to front door      Patient Goals: More steady, pt wants to get stronger       PRECAUTIONS: Imbalance/foot drop   SUBJECTIVE:                                                                                                                                                                                      SUBJECTIVE STATEMENT:  Patient's wife reports difficulty with sit to stand exercise. They ask about dorsiflexion assist brace and online resources for this which were briefly discussed last visit. Patient reports no major incidents or changes since Tuesday of this week.    PAIN:  Are you having pain? No   OBJECTIVE: (objective measures completed at initial evaluation unless otherwise dated)   Patient Surveys  FOTO: 7, predicted improvement to 16       Cognition Patient is oriented to person, place, and time.  Recent memory is impaired.  Remote memory is impaired.  Attention span and concentration are intact.  Expressive speech is intact.  Patient's fund of knowledge is within normal limits for educational level.                            Gross Musculoskeletal Assessment Tremor: None Bulk: Normal Tone: Normal     GAIT: Distance walked: 40 ft  Assistive device utilized: None Level of assistance: SBA Comments: L eccentric dorsiflexion control is poor following heel strike, shortened single-limb support time; normal step/stride length and good cadence     Posture: FHRS posture, posterior pelvic tilt     AROM       AROM (Normal range in degrees) AROM  01/23/2023  Ankle      Dorsiflexion (20) 15 5  Plantarflexion (50) WNL WNL  Inversion (35)  Eversion (15      (* = pain; Blank rows = not tested)     LE MMT:   MMT (out of 5)  Right 01/23/2023 Left 01/23/2023  Hip flexion 4- 4-  Hip extension      Hip abduction (seated) 4 4  Hip adduction (seated)  4 4  Hip internal rotation      Hip external rotation      Knee flexion 4+ 4  Knee extension 5- 4  Ankle dorsiflexion 4- 3+  Ankle plantarflexion      Ankle inversion      Ankle eversion      (* = pain; Blank rows = not tested)     Sensation Grossly intact to light touch bilateral LEs as determined by testing dermatomes L2-S2, only lacking in L medial malleolus. Proprioception, and hot/cold testing deferred on this date.     Reflexes Deferred     Cranial Nerves Visual acuity and visual fields are intact  Extraocular muscles are intact  Facial sensation is intact bilaterally  Facial strength is intact bilaterally  Hearing is normal as tested by gross conversation Palate elevates midline, normal phonation  Shoulder shrug strength is intact  Tongue protrudes midline     Coordination/Cerebellar Finger to Nose: Intermittent dysmetria Heel to Shin: WNL Rapid alternating movements: Impaired UE, LE impaired (also affected by L foot drop)  Finger Opposition: WNL Pronator Drift: Positive R>L     FUNCTIONAL OUTCOME MEASURES     Results Comments  BERG 01/24/23: 47/56 Fall risk, in need of intervention  DGI 01/24/23: 18/24    TUG 01/22/23: 11 seconds    5TSTS 01/22/23: Unable to perform sit to stand without UE support    10 Meter Gait Speed 01/22/23: Self-selected: s = 1.05 m/s; Fastest: s = 1.16 m/s Below normative values for full community ambulation  (Blank rows = not tested)      Clinical Test of Sensory Interaction for Balance    (CTSIB):  CONDITION TIME STRATEGY SWAY  Eyes open, firm surface 30 seconds ankle Minimal  Eyes closed, firm surface 30 seconds ankle Increased sway, no LOB   Eyes open, foam surface 30 seconds ankle   Eyes closed, foam surface 30 seconds Ankle, hip         TODAY'S TREATMENT      Neuromuscular Re-education - for  improved sensory integration, static and dynamic postural control, equilibrium and non-equilibrium coordination as needed for negotiating home and community environment and stepping over obstacles   Performance of DGI, BERG, mCTSIB  -Reviewed performance/scores obtained on each outcome measure. We discussed goal for re-assessing in future to determine progress.    Anterior toe tapping; 6-inch step; 2x10 alternating   Standing heel raise/toe raise, alternating; 2x10   Therapeutic Exercise - improved strength as needed to improve performance of CKC activities/functional movements and as needed for power production to prevent fall during episode of large postural perturbation   *next visit* NuStep; Level 3; 5 minutes   -for LE strengthening, inc tissue temperature for improved muscle performance        PATIENT EDUCATION:  Education details: Discussed dorsiflexion-assist devices available online and provided printout for this resource.  Person educated: Patient and Spouse Education method: Explanation, Demonstration, and Handouts Education comprehension: verbalized understanding     HOME EXERCISE PROGRAM: Access Code: [email or web address] URL: [email or web address] Date: 01/23/2023 Prepared by: Consuela Mimes   Exercises - Sit to Stand with Weight  - 2 x daily - 7 x weekly -  2-3 sets - 5-6 reps - Heel Toe Raises with Counter Support  - 2 x daily - 7 x weekly - 2 sets - 10 reps - Standing Hip Abduction with Counter Support  - 2 x daily - 7 x weekly - 2 sets - 10 reps   Patient Education - What You Can Do to Prevent Falls     ASSESSMENT:   CLINICAL IMPRESSION: Patient has BERG above established cut-off of 45. DGI is less than established cut-off score for fall risk. Patient is able to maintain postural control for 30 sec during all conditions with mCTSIB, but he is notably challenged with standing on compliant surface with pt using more hip strategy. He is notably challenged  with maintaining balance with narrow BOS. Patient is still notably challenged with sit to stand performance; we discussed using hands on anterior thighs at this time to help with sit to stand exercise until his strength improves.  Patient will benefit from skilled PT to address his noted impairments and improve overall function.   REHAB POTENTIAL: Good   CLINICAL DECISION MAKING: Evolving/moderate complexity   EVALUATION COMPLEXITY: Moderate     GOALS: Goals reviewed with patient? No   SHORT TERM GOALS: Target date: 02/13/2023   Pt will be independent with HEP in order to improve strength and balance in order to decrease fall risk and improve function at home. Baseline: 01/22/23: Baseline HEP initiated Goal status: INITIAL   Pt will perform independent sit to stand without UE support and symmetrical weightbearing indicative of improved functional LE strength and ability to perform transferring Baseline: 01/22/23: Multiple attempts to perform sit to stand without push on armrests, unable to perform until patient pushed onto anterior thighs  Goal status: INITIAL     LONG TERM GOALS: Target date: 04/03/2023   Pt will increase FOTO to at least 57 to demonstrate significant improvement in function at home related to balance  Baseline: 01/22/23: 48 Goal status: INITIAL   2.  Pt will improve BERG by at least 3 points in order to demonstrate clinically significant improvement in balance.   Baseline: 01/22/23: Baseline to be performed on visit # 2.   01/24/23: 47/56 Goal status: INITIAL   3. Pt will perform 5TSTS in less than 12 seconds to demonstrate clinically significant improvement in LE strength      Baseline:  01/22/23: Baseline to be performed as sit to stand improves.  Goal status: INITIAL   5. Pt will improve DGI by at least 3 points in order to demonstrate clinically significant improvement in balance and decreased risk for falls.     Baseline: 01/22/23: Baseline to be performed on visit  # 2.   01/24/23: 18/24 Goal status: INITIAL     PLAN: PT FREQUENCY: 2x/week   PT DURATION: 12 weeks   PLANNED INTERVENTIONS: Therapeutic exercises, Therapeutic activity, Neuromuscular re-education, Balance training, Gait training, Patient/Family education, Joint manipulation, Joint mobilization, Canalith repositioning, Aquatic Therapy, Dry Needling, Cognitive remediation, Electrical stimulation, Spinal manipulation, Spinal mobilization, Cryotherapy, Moist heat, Traction, Ultrasound, Ionotophoresis 4mg /ml Dexamethasone, and Manual therapy   PLAN FOR NEXT SESSION: Continue with LE strengthening, strategies to improve active dorsiflexion/toe clearance, balance training. Obstacle negotiation training. Static postural control drills.     Consuela Mimes, PT, DPT #[id number]  Gertie Exon, PT 01/24/2023, 12:44 PM

## 2023-01-29 ENCOUNTER — Ambulatory Visit: Payer: PPO

## 2023-01-29 DIAGNOSIS — R2689 Other abnormalities of gait and mobility: Secondary | ICD-10-CM | POA: Diagnosis not present

## 2023-01-29 DIAGNOSIS — M6281 Muscle weakness (generalized): Secondary | ICD-10-CM

## 2023-01-29 DIAGNOSIS — R262 Difficulty in walking, not elsewhere classified: Secondary | ICD-10-CM

## 2023-01-29 NOTE — Therapy (Signed)
OUTPATIENT PHYSICAL THERAPY TREATMENT NOTE   Patient Name: Juan [name] MRN: [id number] DOB:Sep 23, 1941, 82 y.o., male Today's Date: 01/29/2023  PCP: Juan Arbour, MD REFERRING PROVIDER: Lonell Face, MD  END OF SESSION:   PT End of Session - 01/29/23 0951     Visit Number 3    Number of Visits 21    Date for PT Re-Evaluation 04/02/23    Authorization Type HTA 2024 - visit limit based on medical necessity    Authorization Time Period IE 01/22/23    Progress Note Due on Visit 10    PT Start Time 0947    PT Stop Time 1027    PT Time Calculation (min) 40 min    Equipment Utilized During Treatment Gait belt    Activity Tolerance Patient tolerated treatment well;No increased pain    Behavior During Therapy WFL for tasks assessed/performed             Past Medical History:  Diagnosis Date   Arthritis    back- low   Atrial fibrillation (HCC)    Cancer (HCC)    skin ( basal) cell , [name], head, back, inside L ear   Coronary artery disease    Dysrhythmia    MI (myocardial infarction) (HCC)    Sleep apnea    +Cpap in use nightly , last study- 10 yrs. ago   Stroke [name])    "light stroke"   Past Surgical History:  Procedure Laterality Date   APPENDECTOMY     BACK SURGERY  09/2011   lumbar fusion    CARDIAC CATHETERIZATION     CARDIAC SURGERY     CARDIAC VALVE REPLACEMENT     CORONARY ARTERY BYPASS GRAFT     EYE SURGERY Bilateral    catarascts removed-    HERNIA REPAIR Bilateral 1946, 1990's   inguinal x3 procedures    LUMBAR LAMINECTOMY/DECOMPRESSION MICRODISCECTOMY Left 08/14/2019   Procedure: Laminectomy for facet/synovial cyst - left - Lumbar three-Lumbar four;  Surgeon: Juan Sicks, MD;  Location: [name];  Service: Neurosurgery;  Laterality: Left;   TONSILLECTOMY     Patient Active Problem List   Diagnosis Date Noted   Synovial cyst of lumbar facet joint 08/14/2019    REFERRING DIAG:  R53.83 (ICD-10-CM) - Other fatigue     THERAPY DIAG:   Imbalance  Difficulty in walking, not elsewhere classified  Muscle weakness (generalized)  Rationale for Evaluation and Treatment Rehabilitation  PERTINENT HISTORY: Patient is an 82 year old male referred for balance and foot drop. 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 Patient's wife reports that patient had small stroke last summer. He has more difficulty with memory. She states he walks more slowly and his wife is concerned he might fall. They report trouble with getting up and down from chair. He reports more "wobbling" and being more unsteady with gait. His wife reports that his "heart is weak" - pt has Hx of CAD, A-fib, and MI. No unexplained weight loss.      Patient Goals: More steady, pt wants to get stronger       PRECAUTIONS: Imbalance/foot drop   SUBJECTIVE:  SUBJECTIVE STATEMENT:  Pt doing well. HEP going ok. No medical updates since last session.    PAIN:  Are you having pain? No   OBJECTIVE: (objective measures completed at initial evaluation unless otherwise dated)    TODAY'S TREATMENT 01/29/23 -AA/ROM on Nustep, level 3 x 5 minutes -anterior alternate toe taps, hands free, rails prn x20 -lateral toe taps x12 bilat, medial toe taps x12 bilat  -STS from chair to failure 8x -5xSTS from chair c axial 8lb FW (minA for final 2)  -STS from chair + short foam pad 1x10  -5xSTS from chair + short foam pad, axial 8lb FW  -resisted walking with cable machine: 20lb, 3lb AW bilat, step over half roll, up/down 2 airex pads: twice forward, twice Rt, twice left    -obstacle course AMB loop around gym: airex beam in //s, half roller balance beam, 2 airex pads in unison, 1 8" stepity stool: 3 laps (all with 3lb AW bilat)          PATIENT EDUCATION:   Education details: Discussed dorsiflexion-assist devices available online and provided printout for this resource.  Person educated: Patient and Spouse Education method: Explanation, Demonstration, and Handouts Education comprehension: verbalized understanding     HOME EXERCISE PROGRAM: Access Code: [email or web address] URL: [email or web address] Date: 01/23/2023 Prepared by: Juan [name]   Exercises - Sit to Stand with Weight  - 2 x daily - 7 x weekly - 2-3 sets - 5-6 reps - Heel Toe Raises with Counter Support  - 2 x daily - 7 x weekly - 2 sets - 10 reps - Standing Hip Abduction with Counter Support  - 2 x daily - 7 x weekly - 2 sets - 10 reps   Patient Education - What You Can Do to Prevent Falls     ASSESSMENT:   CLINICAL IMPRESSION: Pt doing well today in general. Challenged with variable height and intensity of STS transfers. Pt given seated recovery to maximize performance, however not overly fatigued. Pt requires frequent minA for falls recovery in session. Patient will benefit from skilled PT to address his noted impairments and improve overall function.   REHAB POTENTIAL: Good   CLINICAL DECISION MAKING: Evolving/moderate complexity   EVALUATION COMPLEXITY: Moderate     GOALS: Goals reviewed with patient? No   SHORT TERM GOALS: Target date: 02/13/2023   Pt will be independent with HEP in order to improve strength and balance in order to decrease fall risk and improve function at home. Baseline: 01/22/23: Baseline HEP initiated Goal status: INITIAL   Pt will perform independent sit to stand without UE support and symmetrical weightbearing indicative of improved functional LE strength and ability to perform transferring Baseline: 01/22/23: Multiple attempts to perform sit to stand without push on armrests, unable to perform until patient pushed onto anterior thighs  Goal status: INITIAL     LONG TERM GOALS: Target date: 04/03/2023   Pt will increase FOTO to  at least 57 to demonstrate significant improvement in function at home related to balance  Baseline: 01/22/23: 48 Goal status: INITIAL   2.  Pt will improve BERG by at least 3 points in order to demonstrate clinically significant improvement in balance.   Baseline: 01/22/23: Baseline to be performed on visit # 2.   01/24/23: 47/56 Goal status: INITIAL   3. Pt will perform 5TSTS in less than 12 seconds to demonstrate clinically significant improvement in LE strength      Baseline:  01/22/23: Baseline to be performed as sit to  stand improves.  Goal status: INITIAL   5. Pt will improve DGI by at least 3 points in order to demonstrate clinically significant improvement in balance and decreased risk for falls.     Baseline: 01/22/23: Baseline to be performed on visit # 2.   01/24/23: 18/24 Goal status: INITIAL     PLAN: PT FREQUENCY: 2x/week   PT DURATION: 12 weeks   PLANNED INTERVENTIONS: Therapeutic exercises, Therapeutic activity, Neuromuscular re-education, Balance training, Gait training, Patient/Family education, Joint manipulation, Joint mobilization, Canalith repositioning, Aquatic Therapy, Dry Needling, Cognitive remediation, Electrical stimulation, Spinal manipulation, Spinal mobilization, Cryotherapy, Moist heat, Traction, Ultrasound, Ionotophoresis 4mg /ml Dexamethasone, and Manual therapy   PLAN FOR NEXT SESSION: Continue with LE strengthening, strategies to improve active dorsiflexion/toe clearance, balance training. Obstacle negotiation training. Static postural control drills.    10:22 AM, 01/29/23 Rosamaria Lints, PT, DPT Physical Therapist - [facility] in [place]  [phone] (Office)

## 2023-01-31 ENCOUNTER — Ambulatory Visit: Payer: PPO | Admitting: Physical Therapy

## 2023-01-31 ENCOUNTER — Encounter: Payer: Self-pay | Admitting: Physical Therapy

## 2023-01-31 DIAGNOSIS — R2689 Other abnormalities of gait and mobility: Secondary | ICD-10-CM | POA: Diagnosis not present

## 2023-01-31 DIAGNOSIS — R262 Difficulty in walking, not elsewhere classified: Secondary | ICD-10-CM

## 2023-01-31 DIAGNOSIS — M6281 Muscle weakness (generalized): Secondary | ICD-10-CM

## 2023-01-31 NOTE — Therapy (Signed)
OUTPATIENT PHYSICAL THERAPY TREATMENT NOTE   Patient Name: Juan [name] MRN: [id number] DOB:09/21/1941, 82 y.o., male Today's Date: 01/31/2023  PCP: Marguarite Arbour, MD REFERRING PROVIDER: Lonell Face, MD  END OF SESSION:   PT End of Session - 01/31/23 0952     Visit Number 4    Number of Visits 21    Date for PT Re-Evaluation 04/02/23    Authorization Type HTA 2024 - visit limit based on medical necessity    Authorization Time Period IE 01/22/23    Progress Note Due on Visit 10    PT Start Time 0946    PT Stop Time 1029    PT Time Calculation (min) 43 min    Equipment Utilized During Treatment Gait belt    Activity Tolerance Patient tolerated treatment well;No increased pain    Behavior During Therapy WFL for tasks assessed/performed              Past Medical History:  Diagnosis Date   Arthritis    back- low   Atrial fibrillation (HCC)    Cancer (HCC)    skin ( basal) cell , face, head, back, inside L ear   Coronary artery disease    Dysrhythmia    MI (myocardial infarction) (HCC)    Sleep apnea    +Cpap in use nightly , last study- 10 yrs. 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"   Past Surgical History:  Procedure Laterality Date   APPENDECTOMY     BACK SURGERY  09/2011   lumbar fusion    CARDIAC CATHETERIZATION     CARDIAC SURGERY     CARDIAC VALVE REPLACEMENT     CORONARY ARTERY BYPASS GRAFT     EYE SURGERY Bilateral    catarascts removed-    HERNIA REPAIR Bilateral 1946, 1990's   inguinal x3 procedures    LUMBAR LAMINECTOMY/DECOMPRESSION MICRODISCECTOMY Left 08/14/2019   Procedure: Laminectomy for facet/synovial cyst - left - Lumbar three-Lumbar four;  Surgeon: Julio Sicks, MD;  Location: [facility] OR;  Service: Neurosurgery;  Laterality: Left;   TONSILLECTOMY     Patient Active Problem List   Diagnosis Date Noted   Synovial cyst of lumbar facet joint 08/14/2019    REFERRING DIAG:  R53.83 (ICD-10-CM) - Other fatigue     THERAPY DIAG:   Imbalance  Difficulty in walking, not elsewhere classified  Muscle weakness (generalized)  Rationale for Evaluation and Treatment Rehabilitation  PERTINENT HISTORY: Patient is an 82 year old male referred for balance and foot drop. 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 Patient's wife reports that patient had small stroke last summer. He has more difficulty with memory. She states he walks more slowly and his wife is concerned he might fall. They report trouble with getting up and down from chair. He reports more "wobbling" and being more unsteady with gait. His wife reports that his "heart is weak" - pt has Hx of CAD, A-fib, and MI. No unexplained weight loss.      Patient Goals: More steady, pt wants to get stronger       PRECAUTIONS: Imbalance/foot drop   SUBJECTIVE:  SUBJECTIVE STATEMENT:  Pt reports feeling somewhat weak and tired today. He and his wife state that some mornings his arms feel more weak.    PAIN:  Are you having pain? No   OBJECTIVE: (objective measures completed at initial evaluation unless otherwise dated)    TODAY'S TREATMENT 01/31/23     Neuromuscular Re-education - for improved sensory integration, static and dynamic postural control, equilibrium and non-equilibrium coordination as needed for negotiating home and community environment and stepping over obstacles  -Anterior alternate toe taps, hands free, rails prn x20 with 6-inch step -3-way tap on 3 cones set in triangle pattern (anteromedial, anterior, anterolateral tap); x 8 ea dir with each foot -Semitandem stance; 2 x 30 sec each (LLE in back and RLE in back) -Single hurdle step; 1 x 15 with bilat LE - emphasis on toe clearance and sound heel strike  -Obstacle course in hallway: 6"  step up/down, 2 consecutive Airex pads, (3) 6-inch hurdles  5x D/B with CGA    *not today* -lateral toe taps x12 bilat, medial toe taps x12 bilat    Therapeutic Exercise - improved strength as needed to improve performance of CKC activities/functional movements and as needed for power production to prevent fall during episode of large postural perturbation   -NuStep cycling, level 4 x 5 minutes for improved soft tissue mobility and increased tissue temperature to improve muscle performance  -subjective information gathered during portion of this time  -3 min not billed  -Sit to stand, chair + Airex for increased seat height; 1 x 12  -additional 1 x 12 with 4-lb Goblet hold with medball   -Resisted walking with cable machine: 20lb, 3lb AW bilat, step over half roll, up/down 2 airex pads: 2x forward stepping only today        PATIENT EDUCATION:  Education details: HEP, demo as well as verbal and tactile curing for exercise technique Person educated: Patient and Spouse Education method: Explanation, Demonstration, and Handouts Education comprehension: verbalized understanding     HOME EXERCISE PROGRAM: Access Code: [email or web address] URL: [email or web address] Date: 01/23/2023 Prepared by: Consuela Mimes   Exercises - Sit to Stand with Weight  - 2 x daily - 7 x weekly - 2-3 sets - 5-6 reps - Heel Toe Raises with Counter Support  - 2 x daily - 7 x weekly - 2 sets - 10 reps - Standing Hip Abduction with Counter Support  - 2 x daily - 7 x weekly - 2 sets - 10 reps   Patient Education - What You Can Do to Prevent Falls     ASSESSMENT:   CLINICAL IMPRESSION: Patient demonstrates improving ability to maintain dorsiflexion during stepping and obstacle negotiation. We worked on multiple drills to emphasize proper heel striking pattern and stepping with "heel first." Patient is notably challenged with static standing with narrow BOS. He is improving quickly with sit to stand  performance and will likely be able to progress to standard-height chair over the next 1-2 visits. Patient will benefit from skilled PT to address his noted impairments and improve overall function.   REHAB POTENTIAL: Good   CLINICAL DECISION MAKING: Evolving/moderate complexity   EVALUATION COMPLEXITY: Moderate     GOALS: Goals reviewed with patient? No   SHORT TERM GOALS: Target date: 02/13/2023   Pt will be independent with HEP in order to improve strength and balance in order to decrease fall risk and improve function at home. Baseline: 01/22/23: Baseline HEP initiated Goal status: INITIAL   Pt will perform  independent sit to stand without UE support and symmetrical weightbearing indicative of improved functional LE strength and ability to perform transferring Baseline: 01/22/23: Multiple attempts to perform sit to stand without push on armrests, unable to perform until patient pushed onto anterior thighs  Goal status: INITIAL     LONG TERM GOALS: Target date: 04/03/2023   Pt will increase FOTO to at least 57 to demonstrate significant improvement in function at home related to balance  Baseline: 01/22/23: 48 Goal status: INITIAL   2.  Pt will improve BERG by at least 3 points in order to demonstrate clinically significant improvement in balance.   Baseline: 01/22/23: Baseline to be performed on visit # 2.   01/24/23: 47/56 Goal status: INITIAL   3. Pt will perform 5TSTS in less than 12 seconds to demonstrate clinically significant improvement in LE strength      Baseline:  01/22/23: Baseline to be performed as sit to stand improves.  Goal status: INITIAL   5. Pt will improve DGI by at least 3 points in order to demonstrate clinically significant improvement in balance and decreased risk for falls.     Baseline: 01/22/23: Baseline to be performed on visit # 2.   01/24/23: 18/24 Goal status: INITIAL     PLAN: PT FREQUENCY: 2x/week   PT DURATION: 12 weeks   PLANNED INTERVENTIONS:  Therapeutic exercises, Therapeutic activity, Neuromuscular re-education, Balance training, Gait training, Patient/Family education, Joint manipulation, Joint mobilization, Canalith repositioning, Aquatic Therapy, Dry Needling, Cognitive remediation, Electrical stimulation, Spinal manipulation, Spinal mobilization, Cryotherapy, Moist heat, Traction, Ultrasound, Ionotophoresis 4mg /ml Dexamethasone, and Manual therapy   PLAN FOR NEXT SESSION: Continue with LE strengthening, strategies to improve active dorsiflexion/toe clearance, balance training. Obstacle negotiation training. Static postural control drills.     1:21 PM, 01/31/23 Consuela Mimes, PT, DPT [phone] Physical Therapist - [facility] in [place]  [phone] (Office)

## 2023-02-05 ENCOUNTER — Encounter: Payer: Self-pay | Admitting: Physical Therapy

## 2023-02-05 ENCOUNTER — Ambulatory Visit: Payer: PPO | Admitting: Physical Therapy

## 2023-02-05 DIAGNOSIS — R262 Difficulty in walking, not elsewhere classified: Secondary | ICD-10-CM

## 2023-02-05 DIAGNOSIS — R2689 Other abnormalities of gait and mobility: Secondary | ICD-10-CM

## 2023-02-05 DIAGNOSIS — M6281 Muscle weakness (generalized): Secondary | ICD-10-CM

## 2023-02-05 NOTE — Therapy (Signed)
OUTPATIENT PHYSICAL THERAPY TREATMENT NOTE   Patient Name: Juan [name] MRN: [id number] DOB:[birth year], 82 y.o., male Today's Date: 02/05/2023  PCP: Marguarite Arbour, MD REFERRING PROVIDER: Lonell Face, MD  END OF SESSION:   PT End of Session - 02/05/23 0949     Visit Number 5    Number of Visits 21    Date for PT Re-Evaluation 04/02/23    Authorization Type HTA 2024 - visit limit based on medical necessity    Authorization Time Period IE 01/22/23    Progress Note Due on Visit 10    PT Start Time 0949    PT Stop Time 1031    PT Time Calculation (min) 42 min    Equipment Utilized During Treatment Gait belt    Activity Tolerance Patient tolerated treatment well;No increased pain    Behavior During Therapy WFL for tasks assessed/performed               Past Medical History:  Diagnosis Date   Arthritis    back- low   Atrial fibrillation (HCC)    Cancer (HCC)    skin ( basal) cell , face, head, back, inside L ear   Coronary artery disease    Dysrhythmia    MI (myocardial infarction) (HCC)    Sleep apnea    +Cpap in use nightly , last study- 10 yrs. ago   Stroke [facility])    "light stroke"   Past Surgical History:  Procedure Laterality Date   APPENDECTOMY     BACK SURGERY  09/2011   lumbar fusion    CARDIAC CATHETERIZATION     CARDIAC SURGERY     CARDIAC VALVE REPLACEMENT     CORONARY ARTERY BYPASS GRAFT     EYE SURGERY Bilateral    catarascts removed-    HERNIA REPAIR Bilateral 1946, 1990's   inguinal x3 procedures    LUMBAR LAMINECTOMY/DECOMPRESSION MICRODISCECTOMY Left 08/14/2019   Procedure: Laminectomy for facet/synovial cyst - left - Lumbar three-Lumbar four;  Surgeon: Julio Sicks, MD;  Location: [facility] OR;  Service: Neurosurgery;  Laterality: Left;   TONSILLECTOMY     Patient Active Problem List   Diagnosis Date Noted   Synovial cyst of lumbar facet joint 08/14/2019    REFERRING DIAG:  R53.83 (ICD-10-CM) - Other fatigue     THERAPY DIAG:   Imbalance  Difficulty in walking, not elsewhere classified  Muscle weakness (generalized)  Rationale for Evaluation and Treatment Rehabilitation  PERTINENT HISTORY: Patient is an 82 year old male referred for balance and foot drop. 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 Patient's wife reports that patient had small stroke last summer. He has more difficulty with memory. She states he walks more slowly and his wife is concerned he might fall. They report trouble with getting up and down from chair. He reports more "wobbling" and being more unsteady with gait. His wife reports that his "heart is weak" - pt has Hx of CAD, A-fib, and MI. No unexplained weight loss.      Patient Goals: More steady, pt wants to get stronger       PRECAUTIONS: Imbalance/foot drop   SUBJECTIVE:  SUBJECTIVE STATEMENT:  Pt reports feeling relatively well this AM. He denies any recent near-fall incidents or major concerns. Patient's wife states she did look into dorsiflexion assist braces - unsure of best option.    PAIN:  Are you having pain? No   OBJECTIVE: (objective measures completed at initial evaluation unless otherwise dated)     TODAY'S TREATMENT 02/05/23    Neuromuscular Re-education - for improved sensory integration, static and dynamic postural control, equilibrium and non-equilibrium coordination as needed for negotiating home and community environment and stepping over obstacles  -Anterior alternate toe taps, hands free, rails prn x20 with 6-inch step -Single hurdle step; 1 x 10 with bilat LE - emphasis on toe clearance and sound heel strike -Forward and retro steps - heel first stepping during forward steps; 6x D/B -3-way tap on 3 cones set in triangle pattern (anteromedial,  anterior, anterolateral tap); x 8 ea dir with each foot -Semitandem stance; 2 x 30 sec each (LLE in back and RLE in back)  Airex forward step up; x10 with each LE   -Obstacle course in hallway: 6" step up/down, 2 consecutive Airex pads, (3) 6-inch hurdles  1x D/B with CGA  -increase volume next visit   *not today* -lateral toe taps x12 bilat, medial toe taps x12 bilat    Therapeutic Exercise - improved strength as needed to improve performance of CKC activities/functional movements and as needed for power production to prevent fall during episode of large postural perturbation   -NuStep cycling, level 4 x 5 minutes for improved soft tissue mobility and increased tissue temperature to improve muscle performance  -subjective information gathered during portion of this time  -3 min not billed  -Sit to stand, with 4-lb medball Goblet hold, standard chair height; 1x4 -Sit to stand with 4-lb medball Goblet hold, chair + Airex for increased seat height; 1 x 10    PATIENT/FAMILY EDUCATION: Discussed appropriate dorsiflexion-assist braces for patient; reviewed online resources.     *not today* -Resisted walking with cable machine: 20lb, 3lb AW bilat, step over half roll, up/down 2 airex pads: 2x forward stepping only today        PATIENT EDUCATION:  Education details: HEP, demo as well as verbal and tactile curing for exercise technique Person educated: Patient and Spouse Education method: Explanation, Demonstration, and Handouts Education comprehension: verbalized understanding     HOME EXERCISE PROGRAM: Access Code: [email or web address] URL: [email or web address] Date: 01/23/2023 Prepared by: Consuela Mimes   Exercises - Sit to Stand with Weight  - 2 x daily - 7 x weekly - 2-3 sets - 5-6 reps - Heel Toe Raises with Counter Support  - 2 x daily - 7 x weekly - 2 sets - 10 reps - Standing Hip Abduction with Counter Support  - 2 x daily - 7 x weekly - 2 sets - 10 reps    Patient Education - What You Can Do to Prevent Falls     ASSESSMENT:   CLINICAL IMPRESSION: Patient is able to perform low volume of sit to stands from standard-height chair with no upper limb support - up to 4 repetitions. The remainder of sit to stands were performed from chair with Airex pad in seat to increase seat height. His wife inquires today about appropriate foot drop brace to use for patient; we reviewed online resources. Following today's appointment, pt was called and informed about [employer] Foot-Up foot drop brace that could be good resource due to ease of donning/doffing. Pt is notably challenged with hurdle  stepping today and will benefit from further intervention for clearing obstacles and negotiating change in surface elevation/grade. Patient will benefit from skilled PT to address his noted impairments and improve overall function.   REHAB POTENTIAL: Good   CLINICAL DECISION MAKING: Evolving/moderate complexity   EVALUATION COMPLEXITY: Moderate     GOALS: Goals reviewed with patient? No   SHORT TERM GOALS: Target date: 02/13/2023   Pt will be independent with HEP in order to improve strength and balance in order to decrease fall risk and improve function at home. Baseline: 01/22/23: Baseline HEP initiated Goal status: INITIAL   Pt will perform independent sit to stand without UE support and symmetrical weightbearing indicative of improved functional LE strength and ability to perform transferring Baseline: 01/22/23: Multiple attempts to perform sit to stand without push on armrests, unable to perform until patient pushed onto anterior thighs  Goal status: INITIAL     LONG TERM GOALS: Target date: 04/03/2023   Pt will increase FOTO to at least 57 to demonstrate significant improvement in function at home related to balance  Baseline: 01/22/23: 48 Goal status: INITIAL   2.  Pt will improve BERG by at least 3 points in order to demonstrate clinically significant  improvement in balance.   Baseline: 01/22/23: Baseline to be performed on visit # 2.   01/24/23: 47/56 Goal status: INITIAL   3. Pt will perform 5TSTS in less than 12 seconds to demonstrate clinically significant improvement in LE strength      Baseline:  01/22/23: Baseline to be performed as sit to stand improves.  Goal status: INITIAL   5. Pt will improve DGI by at least 3 points in order to demonstrate clinically significant improvement in balance and decreased risk for falls.     Baseline: 01/22/23: Baseline to be performed on visit # 2.   01/24/23: 18/24 Goal status: INITIAL     PLAN: PT FREQUENCY: 2x/week   PT DURATION: 12 weeks   PLANNED INTERVENTIONS: Therapeutic exercises, Therapeutic activity, Neuromuscular re-education, Balance training, Gait training, Patient/Family education, Joint manipulation, Joint mobilization, Canalith repositioning, Aquatic Therapy, Dry Needling, Cognitive remediation, Electrical stimulation, Spinal manipulation, Spinal mobilization, Cryotherapy, Moist heat, Traction, Ultrasound, Ionotophoresis 4mg /ml Dexamethasone, and Manual therapy   PLAN FOR NEXT SESSION: Continue with LE strengthening, strategies to improve active dorsiflexion/toe clearance, balance training. Obstacle negotiation training. Static postural control drills.     10:31 AM, 02/05/23 Consuela Mimes, PT, DPT [phone] Physical Therapist - [facility] in [place]  [phone] (Office)

## 2023-02-07 ENCOUNTER — Ambulatory Visit: Payer: PPO | Admitting: Physical Therapy

## 2023-02-07 DIAGNOSIS — Z79899 Other long term (current) drug therapy: Secondary | ICD-10-CM | POA: Diagnosis not present

## 2023-02-07 DIAGNOSIS — L120 Bullous pemphigoid: Secondary | ICD-10-CM | POA: Diagnosis not present

## 2023-02-07 DIAGNOSIS — Z79631 Long term (current) use of antimetabolite agent: Secondary | ICD-10-CM | POA: Diagnosis not present

## 2023-02-12 ENCOUNTER — Ambulatory Visit: Payer: PPO | Admitting: Physical Therapy

## 2023-02-12 DIAGNOSIS — R262 Difficulty in walking, not elsewhere classified: Secondary | ICD-10-CM

## 2023-02-12 DIAGNOSIS — R2689 Other abnormalities of gait and mobility: Secondary | ICD-10-CM

## 2023-02-12 DIAGNOSIS — M6281 Muscle weakness (generalized): Secondary | ICD-10-CM

## 2023-02-12 NOTE — Therapy (Signed)
OUTPATIENT PHYSICAL THERAPY TREATMENT NOTE   Patient Name: Juan [name] MRN: [id number] DOB:01/20/1941, 82 y.o., male Today's Date: 02/12/2023  PCP: Marguarite Arbour, MD REFERRING PROVIDER: Lonell Face, MD  END OF SESSION:   PT End of Session - 02/12/23 0952     Visit Number 6    Number of Visits 21    Date for PT Re-Evaluation 04/02/23    Authorization Type HTA 2024 - visit limit based on medical necessity    Authorization Time Period IE 01/22/23    Progress Note Due on Visit 10    PT Start Time 0949    PT Stop Time 1029    PT Time Calculation (min) 40 min    Equipment Utilized During Treatment Gait belt    Activity Tolerance Patient tolerated treatment well;No increased pain    Behavior During Therapy WFL for tasks assessed/performed              Past Medical History:  Diagnosis Date   Arthritis    back- low   Atrial fibrillation (HCC)    Cancer (HCC)    skin ( basal) cell , face, head, back, inside L ear   Coronary artery disease    Dysrhythmia    MI (myocardial infarction) (HCC)    Sleep apnea    +Cpap in use nightly , last study- 10 yrs. ago   Stroke [facility])    "light stroke"   Past Surgical History:  Procedure Laterality Date   APPENDECTOMY     BACK SURGERY  09/2011   lumbar fusion    CARDIAC CATHETERIZATION     CARDIAC SURGERY     CARDIAC VALVE REPLACEMENT     CORONARY ARTERY BYPASS GRAFT     EYE SURGERY Bilateral    catarascts removed-    HERNIA REPAIR Bilateral 1946, 1990's   inguinal x3 procedures    LUMBAR LAMINECTOMY/DECOMPRESSION MICRODISCECTOMY Left 08/14/2019   Procedure: Laminectomy for facet/synovial cyst - left - Lumbar three-Lumbar four;  Surgeon: Julio Sicks, MD;  Location: [facility] OR;  Service: Neurosurgery;  Laterality: Left;   TONSILLECTOMY     Patient Active Problem List   Diagnosis Date Noted   Synovial cyst of lumbar facet joint 08/14/2019    REFERRING DIAG:  R53.83 (ICD-10-CM) - Other fatigue     THERAPY DIAG:   Imbalance  Difficulty in walking, not elsewhere classified  Muscle weakness (generalized)  Rationale for Evaluation and Treatment Rehabilitation  PERTINENT HISTORY: Patient is an 82 year old male referred for balance and foot drop. 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 Patient's wife reports that patient had small stroke last summer. He has more difficulty with memory. She states he walks more slowly and his wife is concerned he might fall. They report trouble with getting up and down from chair. He reports more "wobbling" and being more unsteady with gait. His wife reports that his "heart is weak" - pt has Hx of CAD, A-fib, and MI. No unexplained weight loss.      Patient Goals: More steady, pt wants to get stronger       PRECAUTIONS: Imbalance/foot drop   SUBJECTIVE:  SUBJECTIVE STATEMENT:  Pt reports no major concerns at arrival. He reports no recent major LOB or near-falls.    PAIN:  Are you having pain? No   OBJECTIVE: (objective measures completed at initial evaluation unless otherwise dated)     TODAY'S TREATMENT 02/12/23     Therapeutic Exercise - improved strength as needed to improve performance of CKC activities/functional movements and as needed for power production to prevent fall during episode of large postural perturbation   -NuStep cycling, Level 5 x 5 minutes for improved soft tissue mobility and increased tissue temperature to improve muscle performance  -subjective information gathered during portion of this time  -2 min not billed  -Sit to stand with 4-lb medball Goblet hold, chair + Airex for increased seat height; 2 x 10    PATIENT/FAMILY EDUCATION: Verbal and tactile cueing for exercise technique with PT demonstration as needed to  review exercises.    *next visit* -Sit to stand, with 4-lb medball Goblet hold, standard chair height; 1x4   *not today* -Resisted walking with cable machine: 20lb, 3lb AW bilat, step over half roll, up/down 2 airex pads: 2x forward stepping only today      Neuromuscular Re-education - for improved sensory integration, static and dynamic postural control, equilibrium and non-equilibrium coordination as needed for negotiating home and community environment and stepping over obstacles  -Anterior alternate toe taps, hands free, rails prn x20 with 6-inch step  -minimal UE support needed today  -Single hurdle step; 1 x 10 with bilat LE - emphasis on toe clearance and sound heel strike -Forward and retro steps - heel first stepping during forward steps; 6x D/B -Semitandem stance; 2 x 30 sec each (LLE in back and RLE in back)  Airex forward step up; x10 with each LE   -Obstacle course in hallway: 6" step up/down, 2 consecutive Airex pads, (3) 6-inch hurdles  3x D/B with CGA    *next visit*  -3/4 Tandem stance  *not today* -3-way tap on 3 cones set in triangle pattern (anteromedial, anterior, anterolateral tap); x 8 ea dir with each foot -lateral toe taps x12 bilat, medial toe taps x12 bilat        PATIENT EDUCATION:  Education details: HEP, demo as well as verbal and tactile curing for exercise technique Person educated: Patient and Spouse Education method: Explanation, Demonstration, and Handouts Education comprehension: verbalized understanding     HOME EXERCISE PROGRAM: Access Code: [email or web address] URL: [email or web address] Date: 01/23/2023 Prepared by: Consuela Mimes   Exercises - Sit to Stand with Weight  - 2 x daily - 7 x weekly - 2-3 sets - 5-6 reps - Heel Toe Raises with Counter Support  - 2 x daily - 7 x weekly - 2 sets - 10 reps - Standing Hip Abduction with Counter Support  - 2 x daily - 7 x weekly - 2 sets - 10 reps   Patient Education - What  You Can Do to Prevent Falls     ASSESSMENT:   CLINICAL IMPRESSION: Patient demonstrates improving ability to perform step-to pattern for stepping over obstacles without LOB and adequate toe clearance. He is still notably challenged with semitandem stance (half tandem today), but he is able to maintain up to 30 sec. Pt demonstrates sound heel strike during gait trials and performs well with exercises training toe clearance. Pt is notably challenged with hurdle stepping today and will benefit from further intervention for clearing obstacles and negotiating change in surface elevation/grade. Patient will benefit from skilled PT  to address his noted impairments and improve overall function.   REHAB POTENTIAL: Good   CLINICAL DECISION MAKING: Evolving/moderate complexity   EVALUATION COMPLEXITY: Moderate     GOALS: Goals reviewed with patient? No   SHORT TERM GOALS: Target date: 02/13/2023   Pt will be independent with HEP in order to improve strength and balance in order to decrease fall risk and improve function at home. Baseline: 01/22/23: Baseline HEP initiated Goal status: INITIAL   Pt will perform independent sit to stand without UE support and symmetrical weightbearing indicative of improved functional LE strength and ability to perform transferring Baseline: 01/22/23: Multiple attempts to perform sit to stand without push on armrests, unable to perform until patient pushed onto anterior thighs  Goal status: INITIAL     LONG TERM GOALS: Target date: 04/03/2023   Pt will increase FOTO to at least 57 to demonstrate significant improvement in function at home related to balance  Baseline: 01/22/23: 48 Goal status: INITIAL   2.  Pt will improve BERG by at least 3 points in order to demonstrate clinically significant improvement in balance.   Baseline: 01/22/23: Baseline to be performed on visit # 2.   01/24/23: 47/56 Goal status: INITIAL   3. Pt will perform 5TSTS in less than 12 seconds  to demonstrate clinically significant improvement in LE strength      Baseline:  01/22/23: Baseline to be performed as sit to stand improves.  Goal status: INITIAL   5. Pt will improve DGI by at least 3 points in order to demonstrate clinically significant improvement in balance and decreased risk for falls.     Baseline: 01/22/23: Baseline to be performed on visit # 2.   01/24/23: 18/24 Goal status: INITIAL     PLAN: PT FREQUENCY: 2x/week   PT DURATION: 12 weeks   PLANNED INTERVENTIONS: Therapeutic exercises, Therapeutic activity, Neuromuscular re-education, Balance training, Gait training, Patient/Family education, Joint manipulation, Joint mobilization, Canalith repositioning, Aquatic Therapy, Dry Needling, Cognitive remediation, Electrical stimulation, Spinal manipulation, Spinal mobilization, Cryotherapy, Moist heat, Traction, Ultrasound, Ionotophoresis 4mg /ml Dexamethasone, and Manual therapy   PLAN FOR NEXT SESSION: Continue with LE strengthening, strategies to improve active dorsiflexion/toe clearance, balance training. Obstacle negotiation training. Static postural control drills.     9:52 AM, 02/12/23 Consuela Mimes, PT, DPT [phone] Physical Therapist - [facility] in [place]  [phone] (Office)

## 2023-02-13 ENCOUNTER — Encounter: Payer: Self-pay | Admitting: Physical Therapy

## 2023-02-14 ENCOUNTER — Ambulatory Visit: Payer: PPO | Admitting: Physical Therapy

## 2023-02-14 ENCOUNTER — Encounter: Payer: Self-pay | Admitting: Physical Therapy

## 2023-02-14 DIAGNOSIS — R262 Difficulty in walking, not elsewhere classified: Secondary | ICD-10-CM

## 2023-02-14 DIAGNOSIS — R2689 Other abnormalities of gait and mobility: Secondary | ICD-10-CM | POA: Diagnosis not present

## 2023-02-14 DIAGNOSIS — M6281 Muscle weakness (generalized): Secondary | ICD-10-CM

## 2023-02-14 NOTE — Therapy (Signed)
OUTPATIENT PHYSICAL THERAPY TREATMENT NOTE   Patient Name: Juan [name] MRN: [id number] DOB:26-Feb-1941, 82 y.o., male Today's Date: 02/14/2023  PCP: Marguarite Arbour, MD REFERRING PROVIDER: Lonell Face, MD  END OF SESSION:   PT End of Session - 02/14/23 0951     Visit Number 7    Number of Visits 21    Date for PT Re-Evaluation 04/02/23    Authorization Type HTA 2024 - visit limit based on medical necessity    Authorization Time Period IE 01/22/23    Progress Note Due on Visit 10    PT Start Time 0947    PT Stop Time 1028    PT Time Calculation (min) 41 min    Equipment Utilized During Treatment Gait belt    Activity Tolerance Patient tolerated treatment well;No increased pain    Behavior During Therapy WFL for tasks assessed/performed              Past Medical History:  Diagnosis Date   Arthritis    back- low   Atrial fibrillation (HCC)    Cancer (HCC)    skin ( basal) cell , face, head, back, inside L ear   Coronary artery disease    Dysrhythmia    MI (myocardial infarction) (HCC)    Sleep apnea    +Cpap in use nightly , last study- 10 yrs. ago   Stroke [facility])    "light stroke"   Past Surgical History:  Procedure Laterality Date   APPENDECTOMY     BACK SURGERY  09/2011   lumbar fusion    CARDIAC CATHETERIZATION     CARDIAC SURGERY     CARDIAC VALVE REPLACEMENT     CORONARY ARTERY BYPASS GRAFT     EYE SURGERY Bilateral    catarascts removed-    HERNIA REPAIR Bilateral 1946, 1990's   inguinal x3 procedures    LUMBAR LAMINECTOMY/DECOMPRESSION MICRODISCECTOMY Left 08/14/2019   Procedure: Laminectomy for facet/synovial cyst - left - Lumbar three-Lumbar four;  Surgeon: Julio Sicks, MD;  Location: [facility] OR;  Service: Neurosurgery;  Laterality: Left;   TONSILLECTOMY     Patient Active Problem List   Diagnosis Date Noted   Synovial cyst of lumbar facet joint 08/14/2019    REFERRING DIAG:  R53.83 (ICD-10-CM) - Other fatigue     THERAPY DIAG:   Imbalance  Difficulty in walking, not elsewhere classified  Muscle weakness (generalized)  Rationale for Evaluation and Treatment Rehabilitation  PERTINENT HISTORY: Patient is an 82 year old male referred for balance and foot drop. 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 Patient's wife reports that patient had small stroke last summer. He has more difficulty with memory. She states he walks more slowly and his wife is concerned he might fall. They report trouble with getting up and down from chair. He reports more "wobbling" and being more unsteady with gait. His wife reports that his "heart is weak" - pt has Hx of CAD, A-fib, and MI. No unexplained weight loss.      Patient Goals: More steady, pt wants to get stronger       PRECAUTIONS: Imbalance/foot drop   SUBJECTIVE:  SUBJECTIVE STATEMENT:  Pt reports no major changes recently. He and his wife have ordered foot drop brace and it is on the way to their home. No recent falls or near-falls.    PAIN:  Are you having pain? No   OBJECTIVE: (objective measures completed at initial evaluation unless otherwise dated)     TODAY'S TREATMENT 02/14/23     Therapeutic Exercise - improved strength as needed to improve performance of CKC activities/functional movements and as needed for power production to prevent fall during episode of large postural perturbation   -NuStep cycling, Level 5 x 5 minutes for improved soft tissue mobility and increased tissue temperature to improve muscle performance  -subjective information gathered during portion of this time  -2 min not billed  -Sit to stand, standard chair without Airex; 1x10 -Sit to stand with 4-lb medball Goblet hold; standard chair without Airex; 1x5    PATIENT/FAMILY  EDUCATION: Verbal and tactile cueing for exercise technique with PT demonstration as needed to review exercises.      *not today* -Resisted walking with cable machine: 20lb, 3lb AW bilat, step over half roll, up/down 2 airex pads: 2x forward stepping only today      Neuromuscular Re-education - for improved sensory integration, static and dynamic postural control, equilibrium and non-equilibrium coordination as needed for negotiating home and community environment and stepping over obstacles  -Anterior alternate toe taps, hands free, rails prn x10 alternating with 6-inch step, x10 alternating with 12-inch step (2 stacked steps)  -minimal UE support needed today  -Single hurdle step over cuff weight; 2 x 10 with bilat LE - emphasis on toe clearance and sound heel strike -Forward and retro steps - heel first stepping during forward steps; 3x D/B half length of hallway -Semitandem stance (3/4 Tandem); 2 x 30 sec each (LLE in back and RLE in back)  Airex forward step up; x10 with each LE   -Obstacle course in hallway: 6" step up/down, 2 consecutive Airex pads, (3) 6-inch hurdles  (step-to pattern) 4x D/B with SBA     *not today* -3-way tap on 3 cones set in triangle pattern (anteromedial, anterior, anterolateral tap); x 8 ea dir with each foot -lateral toe taps x12 bilat, medial toe taps x12 bilat        PATIENT EDUCATION:  Education details: HEP, demo as well as verbal and tactile curing for exercise technique Person educated: Patient and Spouse Education method: Explanation, Demonstration, and Handouts Education comprehension: verbalized understanding     HOME EXERCISE PROGRAM: Access Code: [email or web address] URL: [email or web address] Date: 01/23/2023 Prepared by: Consuela Mimes   Exercises - Sit to Stand with Weight  - 2 x daily - 7 x weekly - 2-3 sets - 5-6 reps - Heel Toe Raises with Counter Support  - 2 x daily - 7 x weekly - 2 sets - 10 reps - Standing Hip  Abduction with Counter Support  - 2 x daily - 7 x weekly - 2 sets - 10 reps   Patient Education - What You Can Do to Prevent Falls     ASSESSMENT:   CLINICAL IMPRESSION: Patient is able to progress to 3/4 Tandem versus half Tandem stance. He demonstrates improving ability to perform sit to stand from chair without added padding for increased height. He is still notably challenged with obstacle negotiation/stepping over. Pt is able to ambulate with sound heel strike, though he does have ongoing chronic weakness of L dorsiflexors. Patient will benefit from skilled PT to address his noted  impairments and improve overall function.   REHAB POTENTIAL: Good   CLINICAL DECISION MAKING: Evolving/moderate complexity   EVALUATION COMPLEXITY: Moderate     GOALS: Goals reviewed with patient? No   SHORT TERM GOALS: Target date: 02/13/2023   Pt will be independent with HEP in order to improve strength and balance in order to decrease fall risk and improve function at home. Baseline: 01/22/23: Baseline HEP initiated Goal status: INITIAL   Pt will perform independent sit to stand without UE support and symmetrical weightbearing indicative of improved functional LE strength and ability to perform transferring Baseline: 01/22/23: Multiple attempts to perform sit to stand without push on armrests, unable to perform until patient pushed onto anterior thighs  Goal status: INITIAL     LONG TERM GOALS: Target date: 04/03/2023   Pt will increase FOTO to at least 57 to demonstrate significant improvement in function at home related to balance  Baseline: 01/22/23: 48 Goal status: INITIAL   2.  Pt will improve BERG by at least 3 points in order to demonstrate clinically significant improvement in balance.   Baseline: 01/22/23: Baseline to be performed on visit # 2.   01/24/23: 47/56 Goal status: INITIAL   3. Pt will perform 5TSTS in less than 12 seconds to demonstrate clinically significant improvement in LE  strength      Baseline:  01/22/23: Baseline to be performed as sit to stand improves.  Goal status: INITIAL   5. Pt will improve DGI by at least 3 points in order to demonstrate clinically significant improvement in balance and decreased risk for falls.     Baseline: 01/22/23: Baseline to be performed on visit # 2.   01/24/23: 18/24 Goal status: INITIAL     PLAN: PT FREQUENCY: 2x/week   PT DURATION: 12 weeks   PLANNED INTERVENTIONS: Therapeutic exercises, Therapeutic activity, Neuromuscular re-education, Balance training, Gait training, Patient/Family education, Joint manipulation, Joint mobilization, Canalith repositioning, Aquatic Therapy, Dry Needling, Cognitive remediation, Electrical stimulation, Spinal manipulation, Spinal mobilization, Cryotherapy, Moist heat, Traction, Ultrasound, Ionotophoresis 4mg /ml Dexamethasone, and Manual therapy   PLAN FOR NEXT SESSION: Continue with LE strengthening, strategies to improve active dorsiflexion/toe clearance, balance training. Obstacle negotiation training. Static postural control drills.     9:51 AM, 02/14/23 Consuela Mimes, PT, DPT [phone] Physical Therapist - [facility] in [place]  [phone] (Office)

## 2023-02-15 DIAGNOSIS — G4733 Obstructive sleep apnea (adult) (pediatric): Secondary | ICD-10-CM | POA: Diagnosis not present

## 2023-02-19 ENCOUNTER — Ambulatory Visit: Payer: PPO

## 2023-02-19 DIAGNOSIS — R2689 Other abnormalities of gait and mobility: Secondary | ICD-10-CM | POA: Diagnosis not present

## 2023-02-19 DIAGNOSIS — R262 Difficulty in walking, not elsewhere classified: Secondary | ICD-10-CM

## 2023-02-19 DIAGNOSIS — M6281 Muscle weakness (generalized): Secondary | ICD-10-CM

## 2023-02-19 NOTE — Therapy (Signed)
OUTPATIENT PHYSICAL THERAPY TREATMENT NOTE   Patient Name: Juan [name] MRN: [id number] DOB:07/10/1941, 82 y.o., male Today's Date: 02/19/2023  PCP: Marguarite Arbour, MD REFERRING PROVIDER: Lonell Face, MD  END OF SESSION:   PT End of Session - 02/19/23 0957     Visit Number 8    Number of Visits 21    Date for PT Re-Evaluation 04/02/23    Authorization Type HTA 2024 - visit limit based on medical necessity    Authorization Time Period IE 01/22/23    Progress Note Due on Visit 10    PT Start Time 0947    PT Stop Time 1029    PT Time Calculation (min) 42 min    Equipment Utilized During Treatment Gait belt    Activity Tolerance Patient tolerated treatment well;No increased pain    Behavior During Therapy WFL for tasks assessed/performed              Past Medical History:  Diagnosis Date   Arthritis    back- low   Atrial fibrillation (HCC)    Cancer (HCC)    skin ( basal) cell , face, head, back, inside L ear   Coronary artery disease    Dysrhythmia    MI (myocardial infarction) (HCC)    Sleep apnea    +Cpap in use nightly , last study- 10 yrs. ago   Stroke [facility])    "light stroke"   Past Surgical History:  Procedure Laterality Date   APPENDECTOMY     BACK SURGERY  09/2011   lumbar fusion    CARDIAC CATHETERIZATION     CARDIAC SURGERY     CARDIAC VALVE REPLACEMENT     CORONARY ARTERY BYPASS GRAFT     EYE SURGERY Bilateral    catarascts removed-    HERNIA REPAIR Bilateral 1946, 1990's   inguinal x3 procedures    LUMBAR LAMINECTOMY/DECOMPRESSION MICRODISCECTOMY Left 08/14/2019   Procedure: Laminectomy for facet/synovial cyst - left - Lumbar three-Lumbar four;  Surgeon: Julio Sicks, MD;  Location: [facility] OR;  Service: Neurosurgery;  Laterality: Left;   TONSILLECTOMY     Patient Active Problem List   Diagnosis Date Noted   Synovial cyst of lumbar facet joint 08/14/2019    REFERRING DIAG:  R53.83 (ICD-10-CM) - Other fatigue     THERAPY DIAG:   Imbalance  Difficulty in walking, not elsewhere classified  Muscle weakness (generalized)  Rationale for Evaluation and Treatment Rehabilitation  PERTINENT HISTORY: Patient is an 82 year old male referred for balance and foot drop. 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 Patient's wife reports that patient had small stroke last summer. He has more difficulty with memory. She states he walks more slowly and his wife is concerned he might fall. They report trouble with getting up and down from chair. He reports more "wobbling" and being more unsteady with gait. His wife reports that his "heart is weak" - pt has Hx of CAD, A-fib, and MI. No unexplained weight loss.      Patient Goals: More steady, pt wants to get stronger       PRECAUTIONS: Imbalance/foot drop   SUBJECTIVE:  SUBJECTIVE STATEMENT:  Pt reports no falls. Has received his ankle brace and been using it the last couple days. Reports it works well for him to clear his left foot.    PAIN:  Are you having pain? No   OBJECTIVE: (objective measures completed at initial evaluation unless otherwise dated)     TODAY'S TREATMENT 02/19/23  Therapeutic Exercise - improved strength as needed to improve performance of CKC activities/functional movements and as needed for power production to prevent fall during episode of large postural perturbation   -NuStep cycling, Level 5 x 5 minutes for improved soft tissue mobility and increased tissue temperature to improve muscle performance  -Sit to stand with 4-lb medball Goblet hold; standard chair without Airex; 2x5. Required minA for last 2 on second set due to LE weakness.     PATIENT/FAMILY EDUCATION: Verbal and tactile cueing for exercise technique with PT demonstration as  needed to review exercises.     Neuromuscular Re-education - for improved sensory integration, static and dynamic postural control, equilibrium and non-equilibrium coordination as needed for negotiating home and community environment and stepping over obstacles  -Anterior alternate toe taps, hands free, 2x12, 12" step with 2 # AW's.   -minimal UE support needed today   -Gait in hallway forwards and retro with 2# AW's donned: x2 laps length of hallway  -Side stepping with 2# AW's: 4x30'/direction. Min multimodal cuing for staying parallel to walls  -Obstacle course: blue pad over floor with AW's underneath --> alternating cone taps x6 --> x3 6" hurdle step overs leading with LLE --> airex pad step up and over: x4 laps, CGA    PATIENT EDUCATION:  Education details: HEP, demo as well as verbal and tactile curing for exercise technique Person educated: Patient and Spouse Education method: Explanation, Demonstration, and Handouts Education comprehension: verbalized understanding     HOME EXERCISE PROGRAM: Access Code: [email or web address] URL: [email or web address] Date: 01/23/2023 Prepared by: Consuela Mimes   Exercises - Sit to Stand with Weight  - 2 x daily - 7 x weekly - 2-3 sets - 5-6 reps - Heel Toe Raises with Counter Support  - 2 x daily - 7 x weekly - 2 sets - 10 reps - Standing Hip Abduction with Counter Support  - 2 x daily - 7 x weekly - 2 sets - 10 reps   Patient Education - What You Can Do to Prevent Falls     ASSESSMENT:   CLINICAL IMPRESSION: Pt received continuing POC with focus on dynamic gait and LE strength. Pt demonstrating consistent LLE heel strike in session possibly due to brace. Continuing with unstable surfaces, SLS, and foor clearance exercise to improve L hip strength in [place] and CKC to reduce falls risk. Pt remains notable weak with poor power output with STS's needing physical assist from PT. Encouraged pt to continue wearing brace to improve L  ankle DF for falls reduction. Patient will benefit from skilled PT to address his noted impairments and improve overall function.   REHAB POTENTIAL: Good   CLINICAL DECISION MAKING: Evolving/moderate complexity   EVALUATION COMPLEXITY: Moderate     GOALS: Goals reviewed with patient? No   SHORT TERM GOALS: Target date: 02/13/2023   Pt will be independent with HEP in order to improve strength and balance in order to decrease fall risk and improve function at home. Baseline: 01/22/23: Baseline HEP initiated Goal status: INITIAL   Pt will perform independent sit to stand without UE support and symmetrical weightbearing indicative of improved functional  LE strength and ability to perform transferring Baseline: 01/22/23: Multiple attempts to perform sit to stand without push on armrests, unable to perform until patient pushed onto anterior thighs  Goal status: INITIAL     LONG TERM GOALS: Target date: 04/03/2023   Pt will increase FOTO to at least 57 to demonstrate significant improvement in function at home related to balance  Baseline: 01/22/23: 48 Goal status: INITIAL   2.  Pt will improve BERG by at least 3 points in order to demonstrate clinically significant improvement in balance.   Baseline: 01/22/23: Baseline to be performed on visit # 2.   01/24/23: 47/56 Goal status: INITIAL   3. Pt will perform 5TSTS in less than 12 seconds to demonstrate clinically significant improvement in LE strength      Baseline:  01/22/23: Baseline to be performed as sit to stand improves.  Goal status: INITIAL   5. Pt will improve DGI by at least 3 points in order to demonstrate clinically significant improvement in balance and decreased risk for falls.     Baseline: 01/22/23: Baseline to be performed on visit # 2.   01/24/23: 18/24 Goal status: INITIAL     PLAN: PT FREQUENCY: 2x/week   PT DURATION: 12 weeks   PLANNED INTERVENTIONS: Therapeutic exercises, Therapeutic activity, Neuromuscular  re-education, Balance training, Gait training, Patient/Family education, Joint manipulation, Joint mobilization, Canalith repositioning, Aquatic Therapy, Dry Needling, Cognitive remediation, Electrical stimulation, Spinal manipulation, Spinal mobilization, Cryotherapy, Moist heat, Traction, Ultrasound, Ionotophoresis 4mg /ml Dexamethasone, and Manual therapy   PLAN FOR NEXT SESSION: Continue with LE strengthening, strategies to improve active dorsiflexion/toe clearance, balance training. Obstacle negotiation training. Static postural control drills.    Delphia Grates. Fairly IV, PT, DPT Physical Therapist- [place]  [facility]  12:18 PM, 02/19/23

## 2023-02-21 ENCOUNTER — Ambulatory Visit: Payer: PPO | Admitting: Physical Therapy

## 2023-02-26 ENCOUNTER — Ambulatory Visit: Payer: PPO | Attending: Neurology | Admitting: Physical Therapy

## 2023-02-26 ENCOUNTER — Encounter: Payer: Self-pay | Admitting: Physical Therapy

## 2023-02-26 DIAGNOSIS — M6281 Muscle weakness (generalized): Secondary | ICD-10-CM | POA: Diagnosis not present

## 2023-02-26 DIAGNOSIS — R2689 Other abnormalities of gait and mobility: Secondary | ICD-10-CM | POA: Insufficient documentation

## 2023-02-26 DIAGNOSIS — R262 Difficulty in walking, not elsewhere classified: Secondary | ICD-10-CM | POA: Diagnosis not present

## 2023-02-26 NOTE — Therapy (Signed)
OUTPATIENT PHYSICAL THERAPY TREATMENT NOTE   Patient Name: Juan [name] MRN: [id number] DOB:Jun 26, 1941, 82 y.o., male Today's Date: 02/26/2023  PCP: Marguarite Arbour, MD REFERRING PROVIDER: Lonell Face, MD  END OF SESSION:   PT End of Session - 02/26/23 0947     Visit Number 9    Number of Visits 21    Date for PT Re-Evaluation 04/02/23    Authorization Type HTA 2024 - visit limit based on medical necessity    Authorization Time Period IE 01/22/23    Progress Note Due on Visit 10    PT Start Time 0947    PT Stop Time 1028    PT Time Calculation (min) 41 min    Equipment Utilized During Treatment Gait belt    Activity Tolerance Patient tolerated treatment well;No increased pain    Behavior During Therapy WFL for tasks assessed/performed             Past Medical History:  Diagnosis Date   Arthritis    back- low   Atrial fibrillation (HCC)    Cancer (HCC)    skin ( basal) cell , face, head, back, inside L ear   Coronary artery disease    Dysrhythmia    MI (myocardial infarction) (HCC)    Sleep apnea    +Cpap in use nightly , last study- 10 yrs. ago   Stroke [facility])    "light stroke"   Past Surgical History:  Procedure Laterality Date   APPENDECTOMY     BACK SURGERY  09/2011   lumbar fusion    CARDIAC CATHETERIZATION     CARDIAC SURGERY     CARDIAC VALVE REPLACEMENT     CORONARY ARTERY BYPASS GRAFT     EYE SURGERY Bilateral    catarascts removed-    HERNIA REPAIR Bilateral 1946, 1990's   inguinal x3 procedures    LUMBAR LAMINECTOMY/DECOMPRESSION MICRODISCECTOMY Left 08/14/2019   Procedure: Laminectomy for facet/synovial cyst - left - Lumbar three-Lumbar four;  Surgeon: Julio Sicks, MD;  Location: [facility] OR;  Service: Neurosurgery;  Laterality: Left;   TONSILLECTOMY     Patient Active Problem List   Diagnosis Date Noted   Synovial cyst of lumbar facet joint 08/14/2019    REFERRING DIAG:  R53.83 (ICD-10-CM) - Other fatigue    THERAPY DIAG:   Imbalance  Difficulty in walking, not elsewhere classified  Muscle weakness (generalized)  Rationale for Evaluation and Treatment Rehabilitation  PERTINENT HISTORY: Patient is an 82 year old male referred for balance and foot drop. 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 Patient's wife reports that patient had small stroke last summer. He has more difficulty with memory. She states he walks more slowly and his wife is concerned he might fall. They report trouble with getting up and down from chair. He reports more "wobbling" and being more unsteady with gait. His wife reports that his "heart is weak" - pt has Hx of CAD, A-fib, and MI. No unexplained weight loss.      Patient Goals: More steady, pt wants to get stronger       PRECAUTIONS: Imbalance/foot drop    SUBJECTIVE:  SUBJECTIVE STATEMENT:  Patient reports no recent near-falls or falls. Pt and his wife got dorsiflexion brace that is working well.    PAIN:  Are you having pain? No   OBJECTIVE: (objective measures completed at initial evaluation unless otherwise dated)     TODAY'S TREATMENT 02/26/23  Therapeutic Exercise - improved strength as needed to improve performance of CKC activities/functional movements and as needed for power production to prevent fall during episode of large postural perturbation   -NuStep cycling, Level 5 x 5 minutes for improved soft tissue mobility and increased tissue temperature to improve muscle performance  -Sit to stand with 4-lb medball Goblet hold; standard chair without Airex; 1x5 and 1x3. Required minA for last 2 on second set due to LE weakness.    PATIENT/FAMILY EDUCATION: Verbal and tactile cueing for exercise technique with PT demonstration as needed to review exercises.      Neuromuscular Re-education - for improved sensory integration, static and dynamic postural control, equilibrium and non-equilibrium coordination as needed for negotiating home and community environment and stepping over obstacles  -Anterior alternate toe taps, hands free, 2x12, 12" step with 2 # AW's.   -minimal UE support needed today   On blue agility ladder: Forward/retro steps with cueing for heel first stepping; 5x D/B High knees; 5x D/B  -Side stepping with Green Tband around ankles: 6x D/B length of // bars; Mod multimodal cuing for staying parallel to walls  -Obstacle course: Alternating cone taps x6 --> 6" step up and down --> x3 6" hurdle step overs leading with LLE --> airex pad step up and over: x4 laps, CGA      PATIENT EDUCATION:  Education details: HEP, demo as well as verbal and tactile curing for exercise technique Person educated: Patient and Spouse Education method: Explanation, Demonstration, and Handouts Education comprehension: verbalized understanding     HOME EXERCISE PROGRAM: Access Code: [email or web address] URL: [email or web address] Date: 01/23/2023 Prepared by: Consuela Mimes   Exercises - Sit to Stand with Weight  - 2 x daily - 7 x weekly - 2-3 sets - 5-6 reps - Heel Toe Raises with Counter Support  - 2 x daily - 7 x weekly - 2 sets - 10 reps - Standing Hip Abduction with Counter Support  - 2 x daily - 7 x weekly - 2 sets - 10 reps   Patient Education - What You Can Do to Prevent Falls     ASSESSMENT:   CLINICAL IMPRESSION: Pt is continuing to wear dorsiflexion brace for community-level mobility. Patient reports no recent falls and he demonstrates improved heel strike at initial contact. Patient demonstrates improvement with sit to stand relative to his baseline at initial eval - decreased volume performed during second set today versus last visit given LE fatigue/weakness. Pt will be due for goal update/re-assessment next visit.  Patient will benefit from skilled PT to address his noted impairments and improve overall function.   REHAB POTENTIAL: Good   CLINICAL DECISION MAKING: Evolving/moderate complexity   EVALUATION COMPLEXITY: Moderate     GOALS: Goals reviewed with patient? No   SHORT TERM GOALS: Target date: 02/13/2023   Pt will be independent with HEP in order to improve strength and balance in order to decrease fall risk and improve function at home. Baseline: 01/22/23: Baseline HEP initiated Goal status: INITIAL   Pt will perform independent sit to stand without UE support and symmetrical weightbearing indicative of improved functional LE strength and ability to perform transferring Baseline: 01/22/23: Multiple attempts to perform  sit to stand without push on armrests, unable to perform until patient pushed onto anterior thighs  Goal status: INITIAL     LONG TERM GOALS: Target date: 04/03/2023   Pt will increase FOTO to at least 57 to demonstrate significant improvement in function at home related to balance  Baseline: 01/22/23: 48 Goal status: INITIAL   2.  Pt will improve BERG by at least 3 points in order to demonstrate clinically significant improvement in balance.   Baseline: 01/22/23: Baseline to be performed on visit # 2.   01/24/23: 47/56 Goal status: INITIAL   3. Pt will perform 5TSTS in less than 12 seconds to demonstrate clinically significant improvement in LE strength      Baseline:  01/22/23: Baseline to be performed as sit to stand improves.  Goal status: INITIAL   5. Pt will improve DGI by at least 3 points in order to demonstrate clinically significant improvement in balance and decreased risk for falls.     Baseline: 01/22/23: Baseline to be performed on visit # 2.   01/24/23: 18/24 Goal status: INITIAL     PLAN: PT FREQUENCY: 2x/week   PT DURATION: 12 weeks   PLANNED INTERVENTIONS: Therapeutic exercises, Therapeutic activity, Neuromuscular re-education, Balance training, Gait  training, Patient/Family education, Joint manipulation, Joint mobilization, Canalith repositioning, Aquatic Therapy, Dry Needling, Cognitive remediation, Electrical stimulation, Spinal manipulation, Spinal mobilization, Cryotherapy, Moist heat, Traction, Ultrasound, Ionotophoresis 4mg /ml Dexamethasone, and Manual therapy   PLAN FOR NEXT SESSION: Continue with LE strengthening, strategies to improve active dorsiflexion/toe clearance, balance training. Obstacle negotiation training. Static postural control drills.     Consuela Mimes, PT, DPT [phone]  Physical Therapist- [facility]  9:47 AM, 02/26/23

## 2023-02-28 ENCOUNTER — Ambulatory Visit: Payer: PPO | Admitting: Physical Therapy

## 2023-02-28 DIAGNOSIS — M6281 Muscle weakness (generalized): Secondary | ICD-10-CM

## 2023-02-28 DIAGNOSIS — R2689 Other abnormalities of gait and mobility: Secondary | ICD-10-CM

## 2023-02-28 DIAGNOSIS — R262 Difficulty in walking, not elsewhere classified: Secondary | ICD-10-CM

## 2023-02-28 NOTE — Therapy (Signed)
OUTPATIENT PHYSICAL THERAPY TREATMENT AND PROGRESS NOTE   Dates of reporting period  01/22/23   to   02/28/23    Patient Name: Juan [name] MRN: [id number] DOB:July 16, 1941, 82 y.o., male Today's Date: 02/28/2023  PCP: Marguarite Arbour, MD REFERRING PROVIDER: Lonell Face, MD  END OF SESSION:   PT End of Session - 03/04/23 0605     Visit Number 10    Number of Visits 21    Date for PT Re-Evaluation 04/02/23    Authorization Type HTA 2024 - visit limit based on medical necessity    Authorization Time Period IE 01/22/23    Progress Note Due on Visit 10    PT Start Time 0952    PT Stop Time 1030    PT Time Calculation (min) 38 min    Equipment Utilized During Treatment Gait belt    Activity Tolerance Patient tolerated treatment well;No increased pain    Behavior During Therapy WFL for tasks assessed/performed              Past Medical History:  Diagnosis Date   Arthritis    back- low   Atrial fibrillation (HCC)    Cancer (HCC)    skin ( basal) cell , face, head, back, inside L ear   Coronary artery disease    Dysrhythmia    MI (myocardial infarction) (HCC)    Sleep apnea    +Cpap in use nightly , last study- 10 yrs. ago   Stroke [facility])    "light stroke"   Past Surgical History:  Procedure Laterality Date   APPENDECTOMY     BACK SURGERY  09/2011   lumbar fusion    CARDIAC CATHETERIZATION     CARDIAC SURGERY     CARDIAC VALVE REPLACEMENT     CORONARY ARTERY BYPASS GRAFT     EYE SURGERY Bilateral    catarascts removed-    HERNIA REPAIR Bilateral 1946, 1990's   inguinal x3 procedures    LUMBAR LAMINECTOMY/DECOMPRESSION MICRODISCECTOMY Left 08/14/2019   Procedure: Laminectomy for facet/synovial cyst - left - Lumbar three-Lumbar four;  Surgeon: Julio Sicks, MD;  Location: [facility] OR;  Service: Neurosurgery;  Laterality: Left;   TONSILLECTOMY     Patient Active Problem List   Diagnosis Date Noted   Synovial cyst of lumbar facet joint 08/14/2019    REFERRING  DIAG:  R53.83 (ICD-10-CM) - Other fatigue    THERAPY DIAG:  Imbalance  Difficulty in walking, not elsewhere classified  Muscle weakness (generalized)  Rationale for Evaluation and Treatment Rehabilitation  PERTINENT HISTORY: Patient is an 82 year old male referred for balance and foot drop. 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 Patient's wife reports that patient had small stroke last summer. He has more difficulty with memory. She states he walks more slowly and his wife is concerned he might fall. They report trouble with getting up and down from chair. He reports more "wobbling" and being more unsteady with gait. His wife reports that his "heart is weak" - pt has Hx of CAD, A-fib, and MI. No unexplained weight loss.      Patient Goals: More steady, pt wants to get stronger       PRECAUTIONS: Imbalance/foot drop    SUBJECTIVE:  SUBJECTIVE STATEMENT:  Patient reports making good progress with transferring. He reports good confidence with gait/functional mobility without LOB. Patient reports no specific concerns with gait/balance.    PAIN:  Are you having pain? No   OBJECTIVE: (objective measures completed at initial evaluation unless otherwise dated)     TODAY'S TREATMENT 03/04/23  Therapeutic Exercise - improved strength as needed to improve performance of CKC activities/functional movements and as needed for power production to prevent fall during episode of large postural perturbation   *GOAL UPDATE PERFORMED -Performance of 5TSTS  PATIENT/FAMILY EDUCATION: Discussed with pt current progress in PT and goals of PT moving forward, continued POC   *next visit* -NuStep cycling, Level 5 x 5 minutes for improved soft tissue mobility and increased tissue  temperature to improve muscle performance -Sit to stand with 4-lb medball Goblet hold; standard chair without Airex; 1x5 and 1x3. Required minA for last 2 on second set due to LE weakness.     Neuromuscular Re-education - for improved sensory integration, static and dynamic postural control, equilibrium and non-equilibrium coordination as needed for negotiating home and community environment and stepping over obstacles  Performance of BERG, DGI  On blue agility ladder: Forward/retro steps with cueing for heel first stepping; 5x D/B High knees; 5x D/B   -Obstacle course: Alternating cone taps x6 --> 6" step up and down --> x3 6" hurdle step overs leading with LLE --> airex pad step up and over: x4 laps, CGA   *next visit* -Anterior alternate toe taps, hands free, 2x12, 12" step with 2 # AW's.   -minimal UE support needed today    *not today* -Side stepping with Green Tband around ankles: 6x D/B length of // bars; Mod multimodal cuing for staying parallel to walls    PATIENT EDUCATION:  Education details: HEP, demo as well as verbal and tactile curing for exercise technique Person educated: Patient and Spouse Education method: Explanation, Demonstration, and Handouts Education comprehension: verbalized understanding     HOME EXERCISE PROGRAM: Access Code: [email or web address] URL: [email or web address] Date: 01/23/2023 Prepared by: Consuela Mimes   Exercises - Sit to Stand with Weight  - 2 x daily - 7 x weekly - 2-3 sets - 5-6 reps - Heel Toe Raises with Counter Support  - 2 x daily - 7 x weekly - 2 sets - 10 reps - Standing Hip Abduction with Counter Support  - 2 x daily - 7 x weekly - 2 sets - 10 reps   Patient Education - What You Can Do to Prevent Falls     ASSESSMENT:   CLINICAL IMPRESSION: Patient has remarkably improved sit to stand performance and is able to capture baseline for 5TSTS; pt has goal for established cut-off score of less than 12 seconds, and  he still has significant room for improvement to attain this. Pt has improved BERG and DGI scores, both just one point shy of attaining MCID. Patient has met his short-term goals and has fortunately met his FOTO score. Feedback on FOTO may be confounded by memory impairment. Hs is making apparent progress with objective measures obtained. Pt has remaining deficits in dynamic balance, gait stability, and equilibrium coordination necessitating further PT intervention. Patient will benefit from skilled PT to address his noted impairments and improve overall function.   REHAB POTENTIAL: Good   CLINICAL DECISION MAKING: Evolving/moderate complexity   EVALUATION COMPLEXITY: Moderate     GOALS: Goals reviewed with patient? No   SHORT TERM GOALS: Target date: 02/13/2023   Pt will  be independent with HEP in order to improve strength and balance in order to decrease fall risk and improve function at home. Baseline: 01/22/23: Baseline HEP initiated.   02/28/23: Compliant with HEP Goal status: ACHIEVED   Pt will perform independent sit to stand without UE support and symmetrical weightbearing indicative of improved functional LE strength and ability to perform transferring Baseline: 01/22/23: Multiple attempts to perform sit to stand without push on armrests, unable to perform until patient pushed onto anterior thighs.   02/28/23: Sit to stand independent with no UE support, stable upon completion of transfer.  Goal status: ACHIEVED      LONG TERM GOALS: Target date: 04/03/2023   Pt will increase FOTO to at least 57 to demonstrate significant improvement in function at home related to balance  Baseline: 01/22/23: 48.   02/28/23: 73/57 Goal status: ACHIEVED    2.  Pt will improve BERG by at least 3 points in order to demonstrate clinically significant improvement in balance.   Baseline: 01/22/23: Baseline to be performed on visit # 2.   01/24/23: 47/56.   02/28/23: 49/56 Goal status: ON-GOING   3. Pt will  perform 5TSTS in less than 12 seconds to demonstrate clinically significant improvement in LE strength      Baseline:  01/22/23: Baseline to be performed as sit to stand improves.   02/28/23: 23.5 sec Goal status: ON-GOING   5. Pt will improve DGI by at least 3 points in order to demonstrate clinically significant improvement in balance and decreased risk for falls.     Baseline: 01/22/23: Baseline to be performed on visit # 2.   01/24/23: 18/24.   02/28/23: 20/24 Goal status: IN PROGRESS     PLAN: PT FREQUENCY: 2x/week   PT DURATION: 4-6 weeks   PLANNED INTERVENTIONS: Therapeutic exercises, Therapeutic activity, Neuromuscular re-education, Balance training, Gait training, Patient/Family education, Joint manipulation, Joint mobilization, Canalith repositioning, Aquatic Therapy, Dry Needling, Cognitive remediation, Electrical stimulation, Spinal manipulation, Spinal mobilization, Cryotherapy, Moist heat, Traction, Ultrasound, Ionotophoresis 4mg /ml Dexamethasone, and Manual therapy   PLAN FOR NEXT SESSION: Continue with LE strengthening, strategies to improve active dorsiflexion/toe clearance, balance training. Obstacle negotiation training. Static postural control drills.     Consuela Mimes, PT, DPT [phone]  Physical Therapist- [facility]  6:05 AM, 03/04/23

## 2023-03-04 ENCOUNTER — Ambulatory Visit: Payer: PPO | Admitting: Physical Therapy

## 2023-03-04 DIAGNOSIS — M6281 Muscle weakness (generalized): Secondary | ICD-10-CM

## 2023-03-04 DIAGNOSIS — R262 Difficulty in walking, not elsewhere classified: Secondary | ICD-10-CM

## 2023-03-04 DIAGNOSIS — R2689 Other abnormalities of gait and mobility: Secondary | ICD-10-CM

## 2023-03-04 NOTE — Therapy (Addendum)
OUTPATIENT PHYSICAL THERAPY TREATMENT   Patient Name: Juan [name] MRN: [id number] DOB:Jan 25, 1941, 82 y.o., male Today's Date: 03/04/2023  PCP: Marguarite Arbour, MD REFERRING PROVIDER: Lonell Face, MD  END OF SESSION:   PT End of Session - 03/04/23 0946     Visit Number 11    Number of Visits 21    Date for PT Re-Evaluation 04/02/23    Authorization Type HTA 2024 - visit limit based on medical necessity    Authorization Time Period IE 01/22/23    Progress Note Due on Visit 10    PT Start Time 0947    PT Stop Time 1026    PT Time Calculation (min) 39 min    Equipment Utilized During Treatment Gait belt    Activity Tolerance Patient tolerated treatment well;No increased pain    Behavior During Therapy WFL for tasks assessed/performed             Past Medical History:  Diagnosis Date   Arthritis    back- low   Atrial fibrillation (HCC)    Cancer (HCC)    skin ( basal) cell , face, head, back, inside L ear   Coronary artery disease    Dysrhythmia    MI (myocardial infarction) (HCC)    Sleep apnea    +Cpap in use nightly , last study- 10 yrs. ago   Stroke [facility])    "light stroke"   Past Surgical History:  Procedure Laterality Date   APPENDECTOMY     BACK SURGERY  09/2011   lumbar fusion    CARDIAC CATHETERIZATION     CARDIAC SURGERY     CARDIAC VALVE REPLACEMENT     CORONARY ARTERY BYPASS GRAFT     EYE SURGERY Bilateral    catarascts removed-    HERNIA REPAIR Bilateral 1946, 1990's   inguinal x3 procedures    LUMBAR LAMINECTOMY/DECOMPRESSION MICRODISCECTOMY Left 08/14/2019   Procedure: Laminectomy for facet/synovial cyst - left - Lumbar three-Lumbar four;  Surgeon: Julio Sicks, MD;  Location: [facility] OR;  Service: Neurosurgery;  Laterality: Left;   TONSILLECTOMY     Patient Active Problem List   Diagnosis Date Noted   Synovial cyst of lumbar facet joint 08/14/2019    REFERRING DIAG:  R53.83 (ICD-10-CM) - Other fatigue    THERAPY DIAG:   Imbalance  Difficulty in walking, not elsewhere classified  Muscle weakness (generalized)  Rationale for Evaluation and Treatment Rehabilitation  PERTINENT HISTORY: Patient is an 82 year old male referred for balance and foot drop. 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 Patient's wife reports that patient had small stroke last summer. He has more difficulty with memory. She states he walks more slowly and his wife is concerned he might fall. They report trouble with getting up and down from chair. He reports more "wobbling" and being more unsteady with gait. His wife reports that his "heart is weak" - pt has Hx of CAD, A-fib, and MI. No unexplained weight loss.      Patient Goals: More steady, pt wants to get stronger       PRECAUTIONS: Imbalance/foot drop    SUBJECTIVE:  SUBJECTIVE STATEMENT:  Patient reports no major issues this AM. He reports feeling generally well this AM. Patient reports doing well after last PT visit.    PAIN:  Are you having pain? No   OBJECTIVE: (objective measures completed at initial evaluation unless otherwise dated)     TODAY'S TREATMENT 03/04/23  Therapeutic Exercise - improved strength as needed to improve performance of CKC activities/functional movements and as needed for power production to prevent fall during episode of large postural perturbation    -NuStep cycling, Level 5 x 5 minutes for improved soft tissue mobility and increased tissue temperature to improve muscle performance -Sit to stand with 4-lb medball Goblet hold; standard chair without Airex; 2x6   PATIENT/FAMILY EDUCATION: Verbal and tactile cueing for exercise technique.     Neuromuscular Re-education - for improved sensory integration, static and dynamic  postural control, equilibrium and non-equilibrium coordination as needed for negotiating home and community environment and stepping over obstacles   On blue agility ladder: Forward/retro steps with cueing for heel first stepping; 5x D/B High knees with opposite knee tap; 5x D/B  // bars: Tandem walk along blue line; 3x D/B with intermittent touch on bar as needed Tandem standing attempted -Semitandem standing; 2 x 30 sec, bilat Side stepping over 3 6-inch hurdles; 4x D/B length of bars   -Obstacle course: Alternating cone taps x6 --> 6" step up and down --> airex pad step up and over: x4 laps, CGA     *not today* -Anterior alternate toe taps, hands free, 2x12, 12" step with 2 # AW's.   -minimal UE support needed today  -Side stepping with Green Tband around ankles: 6x D/B length of // bars; Mod multimodal cuing for staying parallel to walls    PATIENT EDUCATION:  Education details: HEP, demo as well as verbal and tactile curing for exercise technique Person educated: Patient and Spouse Education method: Explanation, Demonstration, and Handouts Education comprehension: verbalized understanding     HOME EXERCISE PROGRAM: Access Code: [email or web address] URL: [email or web address] Date: 01/23/2023 Prepared by: Consuela Mimes   Exercises - Sit to Stand with Weight  - 2 x daily - 7 x weekly - 2-3 sets - 5-6 reps - Heel Toe Raises with Counter Support  - 2 x daily - 7 x weekly - 2 sets - 10 reps - Standing Hip Abduction with Counter Support  - 2 x daily - 7 x weekly - 2 sets - 10 reps   Patient Education - What You Can Do to Prevent Falls     ASSESSMENT:   CLINICAL IMPRESSION: Patient exhibits improving sit to stand performance as demonstrated by higher number of reps prior to fatigue without upper limb support. Patient demonstrates sound obstacle clearance at this time and is not tripping over steps/obstacles. He is limited with Tandem stance/narrow BOS standing.  Pt  has remaining deficits in dynamic balance, gait stability, and equilibrium coordination necessitating further PT intervention. Patient will benefit from skilled PT to address his noted impairments and improve overall function.   REHAB POTENTIAL: Good   CLINICAL DECISION MAKING: Evolving/moderate complexity   EVALUATION COMPLEXITY: Moderate     GOALS: Goals reviewed with patient? No   SHORT TERM GOALS: Target date: 02/13/2023   Pt will be independent with HEP in order to improve strength and balance in order to decrease fall risk and improve function at home. Baseline: 01/22/23: Baseline HEP initiated.   02/28/23: Compliant with HEP Goal status: ACHIEVED   Pt will perform independent sit to stand  without UE support and symmetrical weightbearing indicative of improved functional LE strength and ability to perform transferring Baseline: 01/22/23: Multiple attempts to perform sit to stand without push on armrests, unable to perform until patient pushed onto anterior thighs.   02/28/23: Sit to stand independent with no UE support, stable upon completion of transfer.  Goal status: ACHIEVED      LONG TERM GOALS: Target date: 04/03/2023   Pt will increase FOTO to at least 57 to demonstrate significant improvement in function at home related to balance  Baseline: 01/22/23: 48.   02/28/23: 73/57 Goal status: ACHIEVED    2.  Pt will improve BERG by at least 3 points in order to demonstrate clinically significant improvement in balance.   Baseline: 01/22/23: Baseline to be performed on visit # 2.   01/24/23: 47/56.   02/28/23: 49/56 Goal status: ON-GOING   3. Pt will perform 5TSTS in less than 12 seconds to demonstrate clinically significant improvement in LE strength      Baseline:  01/22/23: Baseline to be performed as sit to stand improves.   02/28/23: 23.5 sec Goal status: ON-GOING   5. Pt will improve DGI by at least 3 points in order to demonstrate clinically significant improvement in balance and  decreased risk for falls.     Baseline: 01/22/23: Baseline to be performed on visit # 2.   01/24/23: 18/24.   02/28/23: 20/24 Goal status: IN PROGRESS     PLAN: PT FREQUENCY: 2x/week   PT DURATION: 4-6 weeks   PLANNED INTERVENTIONS: Therapeutic exercises, Therapeutic activity, Neuromuscular re-education, Balance training, Gait training, Patient/Family education, Joint manipulation, Joint mobilization, Canalith repositioning, Aquatic Therapy, Dry Needling, Cognitive remediation, Electrical stimulation, Spinal manipulation, Spinal mobilization, Cryotherapy, Moist heat, Traction, Ultrasound, Ionotophoresis 4mg /ml Dexamethasone, and Manual therapy   PLAN FOR NEXT SESSION: Continue with LE strengthening, strategies to improve active dorsiflexion/toe clearance, balance training. Obstacle negotiation training. Static postural control drills.     Consuela Mimes, PT, DPT [phone]  Physical Therapist- [facility]  9:47 AM, 03/04/23

## 2023-03-06 ENCOUNTER — Ambulatory Visit: Payer: PPO | Admitting: Physical Therapy

## 2023-03-06 DIAGNOSIS — M6281 Muscle weakness (generalized): Secondary | ICD-10-CM

## 2023-03-06 DIAGNOSIS — R2689 Other abnormalities of gait and mobility: Secondary | ICD-10-CM | POA: Diagnosis not present

## 2023-03-06 DIAGNOSIS — R262 Difficulty in walking, not elsewhere classified: Secondary | ICD-10-CM

## 2023-03-06 NOTE — Therapy (Signed)
OUTPATIENT PHYSICAL THERAPY TREATMENT   Patient Name: Juan [name] MRN: [id number] DOB:December 06, 1940, 82 y.o., male Today's Date: 03/06/2023  PCP: Marguarite Arbour, MD REFERRING PROVIDER: Lonell Face, MD  END OF SESSION:   PT End of Session - 03/06/23 1119     Visit Number 12    Number of Visits 21    Date for PT Re-Evaluation 04/02/23    Authorization Type HTA 2024 - visit limit based on medical necessity    Authorization Time Period IE 01/22/23    Progress Note Due on Visit 10    PT Start Time 1118    PT Stop Time 1158    PT Time Calculation (min) 40 min    Equipment Utilized During Treatment Gait belt    Activity Tolerance Patient tolerated treatment well;No increased pain    Behavior During Therapy WFL for tasks assessed/performed              Past Medical History:  Diagnosis Date   Arthritis    back- low   Atrial fibrillation (HCC)    Cancer (HCC)    skin ( basal) cell , face, head, back, inside L ear   Coronary artery disease    Dysrhythmia    MI (myocardial infarction) (HCC)    Sleep apnea    +Cpap in use nightly , last study- 10 yrs. ago   Stroke [facility])    "light stroke"   Past Surgical History:  Procedure Laterality Date   APPENDECTOMY     BACK SURGERY  09/2011   lumbar fusion    CARDIAC CATHETERIZATION     CARDIAC SURGERY     CARDIAC VALVE REPLACEMENT     CORONARY ARTERY BYPASS GRAFT     EYE SURGERY Bilateral    catarascts removed-    HERNIA REPAIR Bilateral 1946, 1990's   inguinal x3 procedures    LUMBAR LAMINECTOMY/DECOMPRESSION MICRODISCECTOMY Left 08/14/2019   Procedure: Laminectomy for facet/synovial cyst - left - Lumbar three-Lumbar four;  Surgeon: Julio Sicks, MD;  Location: [facility] OR;  Service: Neurosurgery;  Laterality: Left;   TONSILLECTOMY     Patient Active Problem List   Diagnosis Date Noted   Synovial cyst of lumbar facet joint 08/14/2019    REFERRING DIAG:  R53.83 (ICD-10-CM) - Other fatigue    THERAPY DIAG:   Imbalance  Difficulty in walking, not elsewhere classified  Muscle weakness (generalized)  Rationale for Evaluation and Treatment Rehabilitation  PERTINENT HISTORY: Patient is an 82 year old male referred for balance and foot drop. 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 Patient's wife reports that patient had small stroke last summer. He has more difficulty with memory. She states he walks more slowly and his wife is concerned he might fall. They report trouble with getting up and down from chair. He reports more "wobbling" and being more unsteady with gait. His wife reports that his "heart is weak" - pt has Hx of CAD, A-fib, and MI. No unexplained weight loss.      Patient Goals: More steady, pt wants to get stronger       PRECAUTIONS: Imbalance/foot drop    SUBJECTIVE:  SUBJECTIVE STATEMENT:  Patient reports some lightheadedness and feeling "off" at arrival. Pt denies visual changes or new/worsening pain. Pt denies other S&S and feels that symptoms are mild. He denies recent falls or safety incidents.   PAIN:  Are you having pain? No   OBJECTIVE: (objective measures completed at initial evaluation unless otherwise dated)     TODAY'S TREATMENT 03/06/23  Vitals check (at arrival): BP: 110/73 HR: 60 SpO2: 100%   Therapeutic Exercise - improved strength as needed to improve performance of CKC activities/functional movements and as needed for power production to prevent fall during episode of large postural perturbation    -NuStep cycling, Level 5 x 5 minutes for improved soft tissue mobility and increased tissue temperature to improve muscle performance -Sit to stand with 6-lb medball Goblet hold; standard chair without Airex; 1x10, 1x8  PATIENT/FAMILY  EDUCATION: Verbal and tactile cueing for exercise technique.     Neuromuscular Re-education - for improved sensory integration, static and dynamic postural control, equilibrium and non-equilibrium coordination as needed for negotiating home and community environment and stepping over obstacles   On blue agility ladder: Forward/retro steps with cueing for heel first stepping; 5x D/B High knees with opposite knee tap; 5x D/B  // bars: Tandem walk along blue line; 3x D/B with intermittent touch on bar as needed -Semitandem standing; 2 x 30 sec, bilat Side stepping over 3 6-inch hurdles; 4x D/B length of bars   -Obstacle course:  6" step up and down --> airex pad step up and over x 2: --> alternating 6-inch and 12-inch hurdles  x4 laps, CGA  Alternating cone taps on blue agility ladder; 8 cones (4 on each side);  4x D/B course    *not today* -Anterior alternate toe taps, hands free, 2x12, 12" step with 2 # AW's.   -minimal UE support needed today  -Side stepping with Green Tband around ankles: 6x D/B length of // bars; Mod multimodal cuing for staying parallel to walls    PATIENT EDUCATION:  Education details: HEP, demo as well as verbal and tactile curing for exercise technique Person educated: Patient and Spouse Education method: Explanation, Demonstration, and Handouts Education comprehension: verbalized understanding     HOME EXERCISE PROGRAM: Access Code: [email or web address] URL: [email or web address] Date: 01/23/2023 Prepared by: Consuela Mimes   Exercises - Sit to Stand with Weight  - 2 x daily - 7 x weekly - 2-3 sets - 5-6 reps - Heel Toe Raises with Counter Support  - 2 x daily - 7 x weekly - 2 sets - 10 reps - Standing Hip Abduction with Counter Support  - 2 x daily - 7 x weekly - 2 sets - 10 reps   Patient Education - What You Can Do to Prevent Falls     ASSESSMENT:   CLINICAL IMPRESSION: Patient exhibits sound toe clearance with obstacles and steps.  He is able to maintain semitandem stance well, but he is still significantly challenged with full Tandem standing. Patient has made good progress to date and is able to further progress volume and intensity of sit to stands. Pt has remaining deficits in dynamic balance, gait stability, and equilibrium coordination necessitating further PT intervention. Patient will benefit from skilled PT to address his noted impairments and improve overall function.   REHAB POTENTIAL: Good   CLINICAL DECISION MAKING: Evolving/moderate complexity   EVALUATION COMPLEXITY: Moderate     GOALS: Goals reviewed with patient? No   SHORT TERM GOALS: Target date: 02/13/2023   Pt will be  independent with HEP in order to improve strength and balance in order to decrease fall risk and improve function at home. Baseline: 01/22/23: Baseline HEP initiated.   02/28/23: Compliant with HEP Goal status: ACHIEVED   Pt will perform independent sit to stand without UE support and symmetrical weightbearing indicative of improved functional LE strength and ability to perform transferring Baseline: 01/22/23: Multiple attempts to perform sit to stand without push on armrests, unable to perform until patient pushed onto anterior thighs.   02/28/23: Sit to stand independent with no UE support, stable upon completion of transfer.  Goal status: ACHIEVED      LONG TERM GOALS: Target date: 04/03/2023   Pt will increase FOTO to at least 57 to demonstrate significant improvement in function at home related to balance  Baseline: 01/22/23: 48.   02/28/23: 73/57 Goal status: ACHIEVED    2.  Pt will improve BERG by at least 3 points in order to demonstrate clinically significant improvement in balance.   Baseline: 01/22/23: Baseline to be performed on visit # 2.   01/24/23: 47/56.   02/28/23: 49/56 Goal status: ON-GOING   3. Pt will perform 5TSTS in less than 12 seconds to demonstrate clinically significant improvement in LE strength      Baseline:   01/22/23: Baseline to be performed as sit to stand improves.   02/28/23: 23.5 sec Goal status: ON-GOING   5. Pt will improve DGI by at least 3 points in order to demonstrate clinically significant improvement in balance and decreased risk for falls.     Baseline: 01/22/23: Baseline to be performed on visit # 2.   01/24/23: 18/24.   02/28/23: 20/24 Goal status: IN PROGRESS     PLAN: PT FREQUENCY: 2x/week   PT DURATION: 4-6 weeks   PLANNED INTERVENTIONS: Therapeutic exercises, Therapeutic activity, Neuromuscular re-education, Balance training, Gait training, Patient/Family education, Joint manipulation, Joint mobilization, Canalith repositioning, Aquatic Therapy, Dry Needling, Cognitive remediation, Electrical stimulation, Spinal manipulation, Spinal mobilization, Cryotherapy, Moist heat, Traction, Ultrasound, Ionotophoresis 4mg /ml Dexamethasone, and Manual therapy   PLAN FOR NEXT SESSION: Continue with LE strengthening, strategies to improve active dorsiflexion/toe clearance, balance training. Obstacle negotiation training. Static postural control drills.     Consuela Mimes, PT, DPT [phone]  Physical Therapist- [facility]  11:20 AM, 03/06/23

## 2023-03-07 ENCOUNTER — Encounter: Payer: Self-pay | Admitting: Physical Therapy

## 2023-03-11 ENCOUNTER — Encounter: Payer: PPO | Admitting: Physical Therapy

## 2023-03-11 DIAGNOSIS — I2510 Atherosclerotic heart disease of native coronary artery without angina pectoris: Secondary | ICD-10-CM | POA: Diagnosis not present

## 2023-03-11 DIAGNOSIS — Z87898 Personal history of other specified conditions: Secondary | ICD-10-CM | POA: Diagnosis not present

## 2023-03-11 DIAGNOSIS — Z9889 Other specified postprocedural states: Secondary | ICD-10-CM | POA: Diagnosis not present

## 2023-03-11 DIAGNOSIS — I255 Ischemic cardiomyopathy: Secondary | ICD-10-CM | POA: Diagnosis not present

## 2023-03-11 DIAGNOSIS — G4733 Obstructive sleep apnea (adult) (pediatric): Secondary | ICD-10-CM | POA: Diagnosis not present

## 2023-03-11 DIAGNOSIS — E7800 Pure hypercholesterolemia, unspecified: Secondary | ICD-10-CM | POA: Diagnosis not present

## 2023-03-11 DIAGNOSIS — I4891 Unspecified atrial fibrillation: Secondary | ICD-10-CM | POA: Diagnosis not present

## 2023-03-11 DIAGNOSIS — I059 Rheumatic mitral valve disease, unspecified: Secondary | ICD-10-CM | POA: Diagnosis not present

## 2023-03-13 ENCOUNTER — Ambulatory Visit: Payer: PPO

## 2023-03-13 DIAGNOSIS — M6281 Muscle weakness (generalized): Secondary | ICD-10-CM

## 2023-03-13 DIAGNOSIS — R2689 Other abnormalities of gait and mobility: Secondary | ICD-10-CM

## 2023-03-13 DIAGNOSIS — R262 Difficulty in walking, not elsewhere classified: Secondary | ICD-10-CM

## 2023-03-13 NOTE — Therapy (Signed)
OUTPATIENT PHYSICAL THERAPY TREATMENT   Patient Name: Juan [name] MRN: [id number] DOB:[birth year], 82 y.o., male Today's Date: 03/13/2023  PCP: Marguarite Arbour, MD REFERRING PROVIDER: Lonell Face, MD  END OF SESSION:   PT End of Session - 03/13/23 1032     Visit Number 13    Number of Visits 21    Date for PT Re-Evaluation 04/02/23    Authorization Type HTA 2024 - visit limit based on medical necessity    Authorization Time Period IE 01/22/23    Progress Note Due on Visit 20    PT Start Time 1030    PT Stop Time 1110    PT Time Calculation (min) 40 min    Equipment Utilized During Treatment Gait belt    Activity Tolerance Patient tolerated treatment well;No increased pain    Behavior During Therapy WFL for tasks assessed/performed              Past Medical History:  Diagnosis Date   Arthritis    back- low   Atrial fibrillation (HCC)    Cancer (HCC)    skin ( basal) cell , face, head, back, inside L ear   Coronary artery disease    Dysrhythmia    MI (myocardial infarction) (HCC)    Sleep apnea    +Cpap in use nightly , last study- 10 yrs. ago   Stroke [facility])    "light stroke"   Past Surgical History:  Procedure Laterality Date   APPENDECTOMY     BACK SURGERY  09/2011   lumbar fusion    CARDIAC CATHETERIZATION     CARDIAC SURGERY     CARDIAC VALVE REPLACEMENT     CORONARY ARTERY BYPASS GRAFT     EYE SURGERY Bilateral    catarascts removed-    HERNIA REPAIR Bilateral 1946, 1990's   inguinal x3 procedures    LUMBAR LAMINECTOMY/DECOMPRESSION MICRODISCECTOMY Left 08/14/2019   Procedure: Laminectomy for facet/synovial cyst - left - Lumbar three-Lumbar four;  Surgeon: Julio Sicks, MD;  Location: [facility] OR;  Service: Neurosurgery;  Laterality: Left;   TONSILLECTOMY     Patient Active Problem List   Diagnosis Date Noted   Synovial cyst of lumbar facet joint 08/14/2019    REFERRING DIAG:  R53.83 (ICD-10-CM) - Other fatigue    THERAPY DIAG:   Imbalance  Difficulty in walking, not elsewhere classified  Muscle weakness (generalized)  Rationale for Evaluation and Treatment Rehabilitation  PERTINENT HISTORY: Patient is an 82 year old male referred for balance and foot drop. 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 Patient's wife reports that patient had small stroke last summer. He has more difficulty with memory. She states he walks more slowly and his wife is concerned he might fall. They report trouble with getting up and down from chair. He reports more "wobbling" and being more unsteady with gait. His wife reports that his "heart is weak" - pt has Hx of CAD, A-fib, and MI. No unexplained weight loss.      Patient Goals: More steady, pt wants to get stronger      PRECAUTIONS: Imbalance/foot drop  SUBJECTIVE:  SUBJECTIVE STATEMENT:  No updates since prior sessions. Pt denies any other episodes with lightheadedness episode.   PAIN:  Are you having pain? No   OBJECTIVE:    TODAY'S TREATMENT 03/13/23 -NuStep cycling, Level 5 x 5 minutes for improved soft tissue mobility and increased tissue temperature to improve muscle performance -Sit to stand from chair hands free (unable, attempted 5x) -STS from chair + 2 airex pads 1x10  -blue line tandem walking 1x, hand support prn, almost constantly  -balance beam walking along 4" boards 2x, 1-2 hands support -tandem stance (static) on board 2x60sec bilat, <10sec each maintained balance  -semi tandem on 2 boards 2x60sec bilat, similar performance, remains very limited but improves with time  *heavily dependent on arms for righting strategy, no stepping strategy seen  -STS from chair + 2 airex pads 1x10 -lateral sidestepping in ladder 1x bilat, then 3x bilat with 5lb ball   -foursquare walking (48ft each direction) c 6lb bilat farmers carry (twice)  -6" step taps c bilat 6lb weights  -lateral step up/down over airex pad x20 (retro LOB ~80% on landings on pad, intermittent success in self-righting)  -same as above with 10lb weight plate in hands U04     PATIENT EDUCATION:  Education details: HEP, demo as well as verbal and tactile curing for exercise technique Person educated: Patient and Spouse Education method: Explanation, Demonstration, and Handouts Education comprehension: verbalized understanding     HOME EXERCISE PROGRAM: Access Code: [email or web address] URL: [email or web address] Date: 01/23/2023 Prepared by: Consuela Mimes   Exercises - Sit to Stand with Weight  - 2 x daily - 7 x weekly - 2-3 sets - 5-6 reps - Heel Toe Raises with Counter Support  - 2 x daily - 7 x weekly - 2 sets - 10 reps - Standing Hip Abduction with Counter Support  - 2 x daily - 7 x weekly - 2 sets - 10 reps        ASSESSMENT:   CLINICAL IMPRESSION: Continued to work on dynamic balance and stepping motor control. Righting strategies remain limited, encouraged patient to use his step control better. Patient will benefit from skilled PT to address his noted impairments and improve overall function.   REHAB POTENTIAL: Good   CLINICAL DECISION MAKING: Evolving/moderate complexity   EVALUATION COMPLEXITY: Moderate     GOALS: Goals reviewed with patient? No   SHORT TERM GOALS: Target date: 02/13/2023   Pt will be independent with HEP in order to improve strength and balance in order to decrease fall risk and improve function at home. Baseline: 01/22/23: Baseline HEP initiated.   02/28/23: Compliant with HEP Goal status: ACHIEVED   Pt will perform independent sit to stand without UE support and symmetrical weightbearing indicative of improved functional LE strength and ability to perform transferring Baseline: 01/22/23: Multiple attempts to perform sit to stand  without push on armrests, unable to perform until patient pushed onto anterior thighs.   02/28/23: Sit to stand independent with no UE support, stable upon completion of transfer.  Goal status: ACHIEVED      LONG TERM GOALS: Target date: 04/03/2023   Pt will increase FOTO to at least 57 to demonstrate significant improvement in function at home related to balance  Baseline: 01/22/23: 48.   02/28/23: 73/57 Goal status: ACHIEVED    2.  Pt will improve BERG by at least 3 points in order to demonstrate clinically significant improvement in balance.   Baseline: 01/22/23: Baseline to be performed on visit # 2.  01/24/23: 47/56.   02/28/23: 49/56 Goal status: ON-GOING   3. Pt will perform 5TSTS in less than 12 seconds to demonstrate clinically significant improvement in LE strength      Baseline:  01/22/23: Baseline to be performed as sit to stand improves.   02/28/23: 23.5 sec Goal status: ON-GOING   5. Pt will improve DGI by at least 3 points in order to demonstrate clinically significant improvement in balance and decreased risk for falls.     Baseline: 01/22/23: Baseline to be performed on visit # 2.   01/24/23: 18/24.   02/28/23: 20/24 Goal status: IN PROGRESS     PLAN: PT FREQUENCY: 2x/week   PT DURATION: 4-6 weeks   PLANNED INTERVENTIONS: Therapeutic exercises, Therapeutic activity, Neuromuscular re-education, Balance training, Gait training, Patient/Family education, Joint manipulation, Joint mobilization, Canalith repositioning, Aquatic Therapy, Dry Needling, Cognitive remediation, Electrical stimulation, Spinal manipulation, Spinal mobilization, Cryotherapy, Moist heat, Traction, Ultrasound, Ionotophoresis 4mg /ml Dexamethasone, and Manual therapy   PLAN FOR NEXT SESSION: Continue with LE strengthening, strategies to improve active dorsiflexion/toe clearance, balance training. Obstacle negotiation training. Static postural control drills.   10:34 AM, 03/13/23 Rosamaria Lints, PT, DPT Physical  Therapist - [facility] in [place]  [phone] (Office)

## 2023-03-18 ENCOUNTER — Ambulatory Visit: Payer: PPO | Admitting: Physical Therapy

## 2023-03-18 ENCOUNTER — Encounter: Payer: Self-pay | Admitting: Physical Therapy

## 2023-03-18 DIAGNOSIS — R2689 Other abnormalities of gait and mobility: Secondary | ICD-10-CM

## 2023-03-18 DIAGNOSIS — M6281 Muscle weakness (generalized): Secondary | ICD-10-CM

## 2023-03-18 DIAGNOSIS — R262 Difficulty in walking, not elsewhere classified: Secondary | ICD-10-CM

## 2023-03-18 NOTE — Therapy (Signed)
OUTPATIENT PHYSICAL THERAPY TREATMENT   Patient Name: Juan [name] MRN: [id number] DOB:Oct 19, 1940, 82 y.o., male Today's Date: 03/18/2023  PCP: Marguarite Arbour, MD REFERRING PROVIDER: Lonell Face, MD  END OF SESSION:   PT End of Session - 03/18/23 1027     Visit Number 14    Number of Visits 21    Date for PT Re-Evaluation 04/02/23    Authorization Type HTA 2024 - visit limit based on medical necessity    Authorization Time Period IE 01/22/23    Progress Note Due on Visit 20    PT Start Time 1030    PT Stop Time 1114    PT Time Calculation (min) 44 min    Equipment Utilized During Treatment Gait belt    Activity Tolerance Patient tolerated treatment well;No increased pain    Behavior During Therapy WFL for tasks assessed/performed               Past Medical History:  Diagnosis Date   Arthritis    back- low   Atrial fibrillation (HCC)    Cancer (HCC)    skin ( basal) cell , face, head, back, inside L ear   Coronary artery disease    Dysrhythmia    MI (myocardial infarction) (HCC)    Sleep apnea    +Cpap in use nightly , last study- 10 yrs. ago   Stroke [facility])    "light stroke"   Past Surgical History:  Procedure Laterality Date   APPENDECTOMY     BACK SURGERY  09/2011   lumbar fusion    CARDIAC CATHETERIZATION     CARDIAC SURGERY     CARDIAC VALVE REPLACEMENT     CORONARY ARTERY BYPASS GRAFT     EYE SURGERY Bilateral    catarascts removed-    HERNIA REPAIR Bilateral 1946, 1990's   inguinal x3 procedures    LUMBAR LAMINECTOMY/DECOMPRESSION MICRODISCECTOMY Left 08/14/2019   Procedure: Laminectomy for facet/synovial cyst - left - Lumbar three-Lumbar four;  Surgeon: Julio Sicks, MD;  Location: [facility] OR;  Service: Neurosurgery;  Laterality: Left;   TONSILLECTOMY     Patient Active Problem List   Diagnosis Date Noted   Synovial cyst of lumbar facet joint 08/14/2019    REFERRING DIAG:  R53.83 (ICD-10-CM) - Other fatigue    THERAPY DIAG:   Imbalance  Difficulty in walking, not elsewhere classified  Muscle weakness (generalized)  Rationale for Evaluation and Treatment Rehabilitation  PERTINENT HISTORY: Patient is an 82 year old male referred for balance and foot drop. 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 Patient's wife reports that patient had small stroke last summer. He has more difficulty with memory. She states he walks more slowly and his wife is concerned he might fall. They report trouble with getting up and down from chair. He reports more "wobbling" and being more unsteady with gait. His wife reports that his "heart is weak" - pt has Hx of CAD, A-fib, and MI. No unexplained weight loss.      Patient Goals: More steady, pt wants to get stronger       PRECAUTIONS: Imbalance/foot drop    SUBJECTIVE:  SUBJECTIVE STATEMENT:  Patient reports no recent near-falls or safety incidents recently. Patient reports generally feeling well.    PAIN:  Are you having pain? No   OBJECTIVE: (objective measures completed at initial evaluation unless otherwise dated)     TODAY'S TREATMENT 03/18/23    Therapeutic Exercise - improved strength as needed to improve performance of CKC activities/functional movements and as needed for power production to prevent fall during episode of large postural perturbation    -NuStep cycling, Level 5 x 5 minutes for improved soft tissue mobility and increased tissue temperature to improve muscle performance  -Sit to stand with 8-lb medball Goblet hold; standard chair without Airex; 1x7, 1x4, and 1x7 (notable fatigue during second set,volume limited)   PATIENT/FAMILY EDUCATION: Verbal and tactile cueing for exercise technique.     Neuromuscular Re-education - for improved  sensory integration, static and dynamic postural control, equilibrium and non-equilibrium coordination as needed for negotiating home and community environment and stepping over obstacles   On blue agility ladder: High knees with opposite knee tap, with 5-lb ankle weights; 5x D/B  // bars: -Attempted 3/4 Tandem standing; difficulty maintaining with pt touching bar after 1-3 sec -Semitandem (half Tandem) standing; 2 x 30 sec, bilat Side stepping over 3 6-inch hurdles; 5x D/B length of bars   -Obstacle course (in hallway):  6" step up and down --> airex pad step up and over x 2: --> alternating lateral cone taps, 8 cones (4 on each side)  x4 laps, CGA  Lateral step up/down over Airex pad, alternating R/L over pad; 2x10 alternating R<>L   *not today* Tandem walk along blue line; 3x D/B with intermittent touch on bar as needed -Anterior alternate toe taps, hands free, 2x12, 12" step with 2 # AW's.   -minimal UE support needed today  -Side stepping with Green Tband around ankles: 6x D/B length of // bars; Mod multimodal cuing for staying parallel to walls    PATIENT EDUCATION:  Education details: HEP, demo as well as verbal and tactile curing for exercise technique Person educated: Patient and Spouse Education method: Explanation, Demonstration, and Handouts Education comprehension: verbalized understanding     HOME EXERCISE PROGRAM: Access Code: [email or web address] URL: [email or web address] Date: 03/18/2023 Prepared by: Consuela Mimes  Exercises - Sit to Stand with Weight  - 2 x daily - 7 x weekly - 2-3 sets - 5-6 reps - Heel Toe Raises with Counter Support  - 2 x daily - 7 x weekly - 2 sets - 10 reps - Standing Hip Abduction with Counter Support  - 2 x daily - 7 x weekly - 2 sets - 10 reps - Standing Anterior Toe Taps  - 2 x daily - 7 x weekly - 2 sets - 10 reps - Standing Romberg to 1/2 Tandem Stance  - 2 x daily - 7 x weekly - 2-3 sets - 30sec hold     ASSESSMENT:    CLINICAL IMPRESSION: Patient is continuing to do well with stepping over obstacles including variations of stepping up/down with change in surface elevation and compliance of surface. He is able to continue progression of intensity with sit to stand with medicine ball goblet hold. Pt participates very well with physical therapy, though he does attest to less-than-ideal HEP compliance - pt states he performs not as consistently as he should. We reiterated importance of completing HEP on most days for best outcome. Pt has remaining deficits in dynamic balance, gait stability, and equilibrium coordination necessitating further PT intervention. Patient  will benefit from skilled PT to address his noted impairments and improve overall function.   REHAB POTENTIAL: Good   CLINICAL DECISION MAKING: Evolving/moderate complexity   EVALUATION COMPLEXITY: Moderate     GOALS: Goals reviewed with patient? No   SHORT TERM GOALS: Target date: 02/13/2023   Pt will be independent with HEP in order to improve strength and balance in order to decrease fall risk and improve function at home. Baseline: 01/22/23: Baseline HEP initiated.   02/28/23: Compliant with HEP Goal status: ACHIEVED   Pt will perform independent sit to stand without UE support and symmetrical weightbearing indicative of improved functional LE strength and ability to perform transferring Baseline: 01/22/23: Multiple attempts to perform sit to stand without push on armrests, unable to perform until patient pushed onto anterior thighs.   02/28/23: Sit to stand independent with no UE support, stable upon completion of transfer.  Goal status: ACHIEVED      LONG TERM GOALS: Target date: 04/03/2023   Pt will increase FOTO to at least 57 to demonstrate significant improvement in function at home related to balance  Baseline: 01/22/23: 48.   02/28/23: 73/57 Goal status: ACHIEVED    2.  Pt will improve BERG by at least 3 points in order to demonstrate  clinically significant improvement in balance.   Baseline: 01/22/23: Baseline to be performed on visit # 2.   01/24/23: 47/56.   02/28/23: 49/56 Goal status: ON-GOING   3. Pt will perform 5TSTS in less than 12 seconds to demonstrate clinically significant improvement in LE strength      Baseline:  01/22/23: Baseline to be performed as sit to stand improves.   02/28/23: 23.5 sec Goal status: ON-GOING   5. Pt will improve DGI by at least 3 points in order to demonstrate clinically significant improvement in balance and decreased risk for falls.     Baseline: 01/22/23: Baseline to be performed on visit # 2.   01/24/23: 18/24.   02/28/23: 20/24 Goal status: IN PROGRESS     PLAN: PT FREQUENCY: 2x/week   PT DURATION: 4-6 weeks   PLANNED INTERVENTIONS: Therapeutic exercises, Therapeutic activity, Neuromuscular re-education, Balance training, Gait training, Patient/Family education, Joint manipulation, Joint mobilization, Canalith repositioning, Aquatic Therapy, Dry Needling, Cognitive remediation, Electrical stimulation, Spinal manipulation, Spinal mobilization, Cryotherapy, Moist heat, Traction, Ultrasound, Ionotophoresis 4mg /ml Dexamethasone, and Manual therapy   PLAN FOR NEXT SESSION: Continue with LE strengthening, strategies to improve active dorsiflexion/toe clearance, balance training. Obstacle negotiation training. Static postural control drills.     Consuela Mimes, PT, DPT [phone]  Physical Therapist- [facility]  11:33 AM, 03/18/23

## 2023-03-20 ENCOUNTER — Ambulatory Visit: Payer: PPO | Admitting: Physical Therapy

## 2023-03-20 DIAGNOSIS — R2689 Other abnormalities of gait and mobility: Secondary | ICD-10-CM

## 2023-03-20 DIAGNOSIS — R262 Difficulty in walking, not elsewhere classified: Secondary | ICD-10-CM

## 2023-03-20 DIAGNOSIS — M6281 Muscle weakness (generalized): Secondary | ICD-10-CM

## 2023-03-20 NOTE — Therapy (Signed)
OUTPATIENT PHYSICAL THERAPY TREATMENT   Patient Name: Juan [name] MRN: [id number] DOB:15-Jun-1941, 82 y.o., male Today's Date: 03/20/2023  PCP: Marguarite Arbour, MD REFERRING PROVIDER: Lonell Face, MD  END OF SESSION:   PT End of Session - 03/20/23 1020     Visit Number 15    Number of Visits 21    Date for PT Re-Evaluation 04/02/23    Authorization Type HTA 2024 - visit limit based on medical necessity    Authorization Time Period IE 01/22/23    Progress Note Due on Visit 20    PT Start Time 1028    PT Stop Time 1118    PT Time Calculation (min) 50 min    Equipment Utilized During Treatment Gait belt    Activity Tolerance Patient tolerated treatment well;No increased pain    Behavior During Therapy WFL for tasks assessed/performed               Past Medical History:  Diagnosis Date   Arthritis    back- low   Atrial fibrillation (HCC)    Cancer (HCC)    skin ( basal) cell , face, head, back, inside L ear   Coronary artery disease    Dysrhythmia    MI (myocardial infarction) (HCC)    Sleep apnea    +Cpap in use nightly , last study- 10 yrs. ago   Stroke [facility])    "light stroke"   Past Surgical History:  Procedure Laterality Date   APPENDECTOMY     BACK SURGERY  09/2011   lumbar fusion    CARDIAC CATHETERIZATION     CARDIAC SURGERY     CARDIAC VALVE REPLACEMENT     CORONARY ARTERY BYPASS GRAFT     EYE SURGERY Bilateral    catarascts removed-    HERNIA REPAIR Bilateral 1946, 1990's   inguinal x3 procedures    LUMBAR LAMINECTOMY/DECOMPRESSION MICRODISCECTOMY Left 08/14/2019   Procedure: Laminectomy for facet/synovial cyst - left - Lumbar three-Lumbar four;  Surgeon: Julio Sicks, MD;  Location: [facility] OR;  Service: Neurosurgery;  Laterality: Left;   TONSILLECTOMY     Patient Active Problem List   Diagnosis Date Noted   Synovial cyst of lumbar facet joint 08/14/2019    REFERRING DIAG:  R53.83 (ICD-10-CM) - Other fatigue    THERAPY DIAG:   Imbalance  Difficulty in walking, not elsewhere classified  Muscle weakness (generalized)  Rationale for Evaluation and Treatment Rehabilitation  PERTINENT HISTORY: Patient is an 82 year old male referred for balance and foot drop. 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 Patient's wife reports that patient had small stroke last summer. He has more difficulty with memory. She states he walks more slowly and his wife is concerned he might fall. They report trouble with getting up and down from chair. He reports more "wobbling" and being more unsteady with gait. His wife reports that his "heart is weak" - pt has Hx of CAD, A-fib, and MI. No unexplained weight loss.      Patient Goals: More steady, pt wants to get stronger       PRECAUTIONS: Imbalance/foot drop    SUBJECTIVE:  SUBJECTIVE STATEMENT:  Patient reports no recent near-falls or safety incidents recently. Patient reports feeling tired - he denies dizziness, generalized weakness, malaise, or other notable symptoms. Patient reports working on HEP a few days per week.    PAIN:  Are you having pain? No   OBJECTIVE: (objective measures completed at initial evaluation unless otherwise dated)     TODAY'S TREATMENT 03/20/23    Therapeutic Exercise - improved strength as needed to improve performance of CKC activities/functional movements and as needed for power production to prevent fall during episode of large postural perturbation    -NuStep cycling, Level 5 x 5 minutes for improved soft tissue mobility and increased tissue temperature to improve muscle performance  -subjective gathered during portion of time on bike  -3 minutes not billed  -Sit to stand with 8-lb medball Goblet hold; standard chair without Airex;  2x8  PATIENT/FAMILY EDUCATION: Verbal and tactile cueing for exercise technique.     Neuromuscular Re-education - for improved sensory integration, static and dynamic postural control, equilibrium and non-equilibrium coordination as needed for negotiating home and community environment and stepping over obstacles   On blue agility ladder: High knees with opposite knee tap, with 5-lb ankle weights; 5x D/B  // bars: -Tandem walk along blue line; 2x D/B with difficulty maintaining without reliance on bars -Semitandem (half Tandem) standing; 2 x 30 sec, bilat -Standing FT on Airex x 30 sec  -minimal difficulty -Semitandem stance on Airex; x 30 sec bilat  -Obstacle course (in hallway):  6" step up and down --> airex pad step up and over x 2: --> alternating lateral cone taps, 8 cones (4 on each side)  x4 laps, CGA  -4 square stepping; 5x CCW/CW with PT CGA   *not today* -Side stepping over 3 6-inch hurdles; 5x D/B length of bars  Lateral step up/down over Airex pad, alternating R/L over pad; 2x10 alternating R<>L -Anterior alternate toe taps, hands free, 2x12, 12" step with 2 # AW's.   -minimal UE support needed today  -Side stepping with Green Tband around ankles: 6x D/B length of // bars; Mod multimodal cuing for staying parallel to walls     PATIENT EDUCATION:  Education details: HEP, demo as well as verbal and tactile curing for exercise technique Person educated: Patient and Spouse Education method: Explanation, Demonstration, and Handouts Education comprehension: verbalized understanding     HOME EXERCISE PROGRAM: Access Code: [email or web address] URL: [email or web address] Date: 03/18/2023 Prepared by: Consuela Mimes  Exercises - Sit to Stand with Weight  - 2 x daily - 7 x weekly - 2-3 sets - 5-6 reps - Heel Toe Raises with Counter Support  - 2 x daily - 7 x weekly - 2 sets - 10 reps - Standing Hip Abduction with Counter Support  - 2 x daily - 7 x weekly - 2  sets - 10 reps - Standing Anterior Toe Taps  - 2 x daily - 7 x weekly - 2 sets - 10 reps - Standing Romberg to 1/2 Tandem Stance  - 2 x daily - 7 x weekly - 2-3 sets - 30sec hold     ASSESSMENT:   CLINICAL IMPRESSION: Patient was educated today on dose-response relationship of exercise and improved outcomes with consistent home exercise. He has completed his HEP at lesser frequency and has made apparent progress to date. Pt is doing well with dorsiflexion-assist brace and he is able to clear obstacles in clinic well and demonstrates good heel to toe progression during level-ground gait.  Pt is continuing to improve with volume of sit to stands with increasing intensity as well. Pt has remaining deficits in dynamic balance, gait stability, and equilibrium coordination necessitating further PT intervention. Patient will benefit from skilled PT to address his noted impairments and improve overall function.   REHAB POTENTIAL: Good   CLINICAL DECISION MAKING: Evolving/moderate complexity   EVALUATION COMPLEXITY: Moderate     GOALS: Goals reviewed with patient? No   SHORT TERM GOALS: Target date: 02/13/2023   Pt will be independent with HEP in order to improve strength and balance in order to decrease fall risk and improve function at home. Baseline: 01/22/23: Baseline HEP initiated.   02/28/23: Compliant with HEP Goal status: ACHIEVED   Pt will perform independent sit to stand without UE support and symmetrical weightbearing indicative of improved functional LE strength and ability to perform transferring Baseline: 01/22/23: Multiple attempts to perform sit to stand without push on armrests, unable to perform until patient pushed onto anterior thighs.   02/28/23: Sit to stand independent with no UE support, stable upon completion of transfer.  Goal status: ACHIEVED      LONG TERM GOALS: Target date: 04/03/2023   Pt will increase FOTO to at least 57 to demonstrate significant improvement in  function at home related to balance  Baseline: 01/22/23: 48.   02/28/23: 73/57 Goal status: ACHIEVED    2.  Pt will improve BERG by at least 3 points in order to demonstrate clinically significant improvement in balance.   Baseline: 01/22/23: Baseline to be performed on visit # 2.   01/24/23: 47/56.   02/28/23: 49/56 Goal status: ON-GOING   3. Pt will perform 5TSTS in less than 12 seconds to demonstrate clinically significant improvement in LE strength      Baseline:  01/22/23: Baseline to be performed as sit to stand improves.   02/28/23: 23.5 sec Goal status: ON-GOING   5. Pt will improve DGI by at least 3 points in order to demonstrate clinically significant improvement in balance and decreased risk for falls.     Baseline: 01/22/23: Baseline to be performed on visit # 2.   01/24/23: 18/24.   02/28/23: 20/24 Goal status: IN PROGRESS     PLAN: PT FREQUENCY: 2x/week   PT DURATION: 4-6 weeks   PLANNED INTERVENTIONS: Therapeutic exercises, Therapeutic activity, Neuromuscular re-education, Balance training, Gait training, Patient/Family education, Joint manipulation, Joint mobilization, Canalith repositioning, Aquatic Therapy, Dry Needling, Cognitive remediation, Electrical stimulation, Spinal manipulation, Spinal mobilization, Cryotherapy, Moist heat, Traction, Ultrasound, Ionotophoresis 4mg /ml Dexamethasone, and Manual therapy   PLAN FOR NEXT SESSION: Continue with LE strengthening, strategies to improve active dorsiflexion/toe clearance, balance training. Obstacle negotiation training. Static postural control drills.     Consuela Mimes, PT, DPT [phone]  Physical Therapist- [facility]  11:19 AM, 03/20/23

## 2023-03-25 ENCOUNTER — Ambulatory Visit: Payer: PPO | Attending: Neurology | Admitting: Physical Therapy

## 2023-03-25 DIAGNOSIS — R262 Difficulty in walking, not elsewhere classified: Secondary | ICD-10-CM | POA: Diagnosis not present

## 2023-03-25 DIAGNOSIS — M6281 Muscle weakness (generalized): Secondary | ICD-10-CM | POA: Diagnosis not present

## 2023-03-25 DIAGNOSIS — R2689 Other abnormalities of gait and mobility: Secondary | ICD-10-CM | POA: Insufficient documentation

## 2023-03-25 NOTE — Therapy (Unsigned)
OUTPATIENT PHYSICAL THERAPY TREATMENT   Patient Name: Juan [name] MRN: [id number] DOB:01-Sep-1941, 82 y.o., male Today's Date: 03/27/2023  PCP: Marguarite Arbour, MD REFERRING PROVIDER: Lonell Face, MD  END OF SESSION:   PT End of Session - 03/27/23 0759     Visit Number 16    Number of Visits 21    Date for PT Re-Evaluation 04/02/23    Authorization Type HTA 2024 - visit limit based on medical necessity    Authorization Time Period IE 01/22/23    Progress Note Due on Visit 20    PT Start Time 1030    PT Stop Time 1114    PT Time Calculation (min) 44 min    Equipment Utilized During Treatment Gait belt    Activity Tolerance Patient tolerated treatment well;No increased pain    Behavior During Therapy WFL for tasks assessed/performed                Past Medical History:  Diagnosis Date   Arthritis    back- low   Atrial fibrillation (HCC)    Cancer (HCC)    skin ( basal) cell , face, head, back, inside L ear   Coronary artery disease    Dysrhythmia    MI (myocardial infarction) (HCC)    Sleep apnea    +Cpap in use nightly , last study- 10 yrs. 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"   Past Surgical History:  Procedure Laterality Date   APPENDECTOMY     BACK SURGERY  09/2011   lumbar fusion    CARDIAC CATHETERIZATION     CARDIAC SURGERY     CARDIAC VALVE REPLACEMENT     CORONARY ARTERY BYPASS GRAFT     EYE SURGERY Bilateral    catarascts removed-    HERNIA REPAIR Bilateral 1946, 1990's   inguinal x3 procedures    LUMBAR LAMINECTOMY/DECOMPRESSION MICRODISCECTOMY Left 08/14/2019   Procedure: Laminectomy for facet/synovial cyst - left - Lumbar three-Lumbar four;  Surgeon: Julio Sicks, MD;  Location: [facility] OR;  Service: Neurosurgery;  Laterality: Left;   TONSILLECTOMY     Patient Active Problem List   Diagnosis Date Noted   Synovial cyst of lumbar facet joint 08/14/2019    REFERRING DIAG:  R53.83 (ICD-10-CM) - Other fatigue    THERAPY DIAG:   Imbalance  Difficulty in walking, not elsewhere classified  Muscle weakness (generalized)  Rationale for Evaluation and Treatment Rehabilitation  PERTINENT HISTORY: Patient is an 82 year old male referred for balance and foot drop. 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 Patient's wife reports that patient had small stroke last summer. He has more difficulty with memory. She states he walks more slowly and his wife is concerned he might fall. They report trouble with getting up and down from chair. He reports more "wobbling" and being more unsteady with gait. His wife reports that his "heart is weak" - pt has Hx of CAD, A-fib, and MI. No unexplained weight loss.      Patient Goals: More steady, pt wants to get stronger       PRECAUTIONS: Imbalance/foot drop    SUBJECTIVE:  SUBJECTIVE STATEMENT:  Patient reports no major updates at arrival. He reports feeling somewhat tired, but not having any other notable symptoms. Pt reports no disturbed sleep last night.    PAIN:  Are you having pain? No   OBJECTIVE: (objective measures completed at initial evaluation unless otherwise dated)     TODAY'S TREATMENT 03/27/23    Therapeutic Exercise - improved strength as needed to improve performance of CKC activities/functional movements and as needed for power production to prevent fall during episode of large postural perturbation    -NuStep cycling, Level 5 x 5 minutes for improved soft tissue mobility and increased tissue temperature to improve muscle performance  -subjective gathered during portion of time on bike  -3 minutes not billed  -Sit to stand with 8-lb medball Goblet hold; standard chair without Airex; 1x7, 1x5   PATIENT/FAMILY EDUCATION: Verbal and tactile cueing  for exercise technique.     Neuromuscular Re-education - for improved sensory integration, static and dynamic postural control, equilibrium and non-equilibrium coordination as needed for negotiating home and community environment and stepping over obstacles   On blue agility ladder: High knees with opposite knee tap, no weight; 3x D/B High knees with opposite knee tap, with 5-lb ankle weights; 3x D/B  // bars: -Tandem walk along blue line; 2x D/B with difficulty maintaining without reliance on bars -Semitandem (3/4 Tandem) standing; 2 x 30 sec, bilat -Standing FT on Airex with perturbations; 2 x 1-minute bouts  -increased intensity of perturbations on second bout   -Obstacle course (in hallway):  6" step up and down --> airex pad step up and over x 2: --> alternating lateral cone taps, 8 cones (4 on each side)  x4 laps, CGA  -4 square stepping; 5x CCW/CW with PT CGA   *not today* -Standing FT on Airex x 30 sec  -minimal difficulty -Side stepping over 3 6-inch hurdles; 5x D/B length of bars  Lateral step up/down over Airex pad, alternating R/L over pad; 2x10 alternating R<>L -Anterior alternate toe taps, hands free, 2x12, 12" step with 2 # AW's.   -minimal UE support needed today  -Side stepping with Green Tband around ankles: 6x D/B length of // bars; Mod multimodal cuing for staying parallel to walls     PATIENT EDUCATION:  Education details: HEP, demo as well as verbal and tactile curing for exercise technique Person educated: Patient and Spouse Education method: Explanation, Demonstration, and Handouts Education comprehension: verbalized understanding     HOME EXERCISE PROGRAM: Access Code: [email or web address] URL: [email or web address] Date: 03/18/2023 Prepared by: Consuela Mimes  Exercises - Sit to Stand with Weight  - 2 x daily - 7 x weekly - 2-3 sets - 5-6 reps - Heel Toe Raises with Counter Support  - 2 x daily - 7 x weekly - 2 sets - 10 reps - Standing  Hip Abduction with Counter Support  - 2 x daily - 7 x weekly - 2 sets - 10 reps - Standing Anterior Toe Taps  - 2 x daily - 7 x weekly - 2 sets - 10 reps - Standing Romberg to 1/2 Tandem Stance  - 2 x daily - 7 x weekly - 2-3 sets - 30sec hold     ASSESSMENT:   CLINICAL IMPRESSION: Patient has made excellent progress with sit to stand performance; pt is unable to further progress volume of loaded sit to stands today. Pt is exhibiting safe negotiation of obstacles, steps, and uneven surfaces. Pt does have inconsistent HEP compliance, but he has  benefited to date from PT with improving capacity for transferring and maintaining balance during dynamic tasks. Pt has remaining deficits in dynamic balance, gait stability, and equilibrium coordination necessitating further PT intervention. Patient will benefit from skilled PT to address his noted impairments and improve overall function.   REHAB POTENTIAL: Good   CLINICAL DECISION MAKING: Evolving/moderate complexity   EVALUATION COMPLEXITY: Moderate     GOALS: Goals reviewed with patient? No   SHORT TERM GOALS: Target date: 02/13/2023   Pt will be independent with HEP in order to improve strength and balance in order to decrease fall risk and improve function at home. Baseline: 01/22/23: Baseline HEP initiated.   02/28/23: Compliant with HEP Goal status: ACHIEVED   Pt will perform independent sit to stand without UE support and symmetrical weightbearing indicative of improved functional LE strength and ability to perform transferring Baseline: 01/22/23: Multiple attempts to perform sit to stand without push on armrests, unable to perform until patient pushed onto anterior thighs.   02/28/23: Sit to stand independent with no UE support, stable upon completion of transfer.  Goal status: ACHIEVED      LONG TERM GOALS: Target date: 04/03/2023   Pt will increase FOTO to at least 57 to demonstrate significant improvement in function at home related to  balance  Baseline: 01/22/23: 48.   02/28/23: 73/57 Goal status: ACHIEVED    2.  Pt will improve BERG by at least 3 points in order to demonstrate clinically significant improvement in balance.   Baseline: 01/22/23: Baseline to be performed on visit # 2.   01/24/23: 47/56.   02/28/23: 49/56 Goal status: ON-GOING   3. Pt will perform 5TSTS in less than 12 seconds to demonstrate clinically significant improvement in LE strength      Baseline:  01/22/23: Baseline to be performed as sit to stand improves.   02/28/23: 23.5 sec Goal status: ON-GOING   5. Pt will improve DGI by at least 3 points in order to demonstrate clinically significant improvement in balance and decreased risk for falls.     Baseline: 01/22/23: Baseline to be performed on visit # 2.   01/24/23: 18/24.   02/28/23: 20/24 Goal status: IN PROGRESS     PLAN: PT FREQUENCY: 2x/week   PT DURATION: 4-6 weeks   PLANNED INTERVENTIONS: Therapeutic exercises, Therapeutic activity, Neuromuscular re-education, Balance training, Gait training, Patient/Family education, Joint manipulation, Joint mobilization, Canalith repositioning, Aquatic Therapy, Dry Needling, Cognitive remediation, Electrical stimulation, Spinal manipulation, Spinal mobilization, Cryotherapy, Moist heat, Traction, Ultrasound, Ionotophoresis 4mg /ml Dexamethasone, and Manual therapy   PLAN FOR NEXT SESSION: Continue with LE strengthening, strategies to improve active dorsiflexion/toe clearance, balance training. Obstacle negotiation training. Static postural control drills.     Consuela Mimes, PT, DPT [phone]  Physical Therapist- [facility]  8:00 AM, 03/27/23

## 2023-03-27 ENCOUNTER — Encounter: Payer: Self-pay | Admitting: Physical Therapy

## 2023-03-27 ENCOUNTER — Ambulatory Visit: Payer: PPO | Admitting: Physical Therapy

## 2023-03-27 DIAGNOSIS — R2689 Other abnormalities of gait and mobility: Secondary | ICD-10-CM

## 2023-03-27 DIAGNOSIS — R262 Difficulty in walking, not elsewhere classified: Secondary | ICD-10-CM

## 2023-03-27 DIAGNOSIS — M6281 Muscle weakness (generalized): Secondary | ICD-10-CM

## 2023-03-27 NOTE — Therapy (Signed)
OUTPATIENT PHYSICAL THERAPY TREATMENT/GOAL UPDATE AND RE-CERTIFICATION   Patient Name: Juan [name] MRN: [id number] DOB:Nov 19, 1940, 82 y.o., male Today's Date: 03/27/2023   END OF SESSION:   PT End of Session - 04/04/23 0819     Visit Number 17    Number of Visits 21    Date for PT Re-Evaluation 04/02/23    Authorization Type HTA 2024 - visit limit based on medical necessity    Authorization Time Period IE 01/22/23    Progress Note Due on Visit 20    PT Start Time 1034    PT Stop Time 1115    PT Time Calculation (min) 41 min    Equipment Utilized During Treatment Gait belt    Activity Tolerance Patient tolerated treatment well;No increased pain    Behavior During Therapy WFL for tasks assessed/performed             Past Medical History:  Diagnosis Date   Arthritis    back- low   Atrial fibrillation (HCC)    Cancer (HCC)    skin ( basal) cell , face, head, back, inside L ear   Coronary artery disease    Dysrhythmia    MI (myocardial infarction) (HCC)    Sleep apnea    +Cpap in use nightly , last study- 10 yrs. ago   Stroke [facility])    "light stroke"   Past Surgical History:  Procedure Laterality Date   APPENDECTOMY     BACK SURGERY  09/2011   lumbar fusion    CARDIAC CATHETERIZATION     CARDIAC SURGERY     CARDIAC VALVE REPLACEMENT     CORONARY ARTERY BYPASS GRAFT     EYE SURGERY Bilateral    catarascts removed-    HERNIA REPAIR Bilateral 1946, 1990's   inguinal x3 procedures    LUMBAR LAMINECTOMY/DECOMPRESSION MICRODISCECTOMY Left 08/14/2019   Procedure: Laminectomy for facet/synovial cyst - left - Lumbar three-Lumbar four;  Surgeon: Julio Sicks, MD;  Location: [facility] OR;  Service: Neurosurgery;  Laterality: Left;   TONSILLECTOMY     Patient Active Problem List   Diagnosis Date Noted   Synovial cyst of lumbar facet joint 08/14/2019    PCP: Marguarite Arbour, MD REFERRING PROVIDER: Lonell Face, MD  REFERRING DIAG:  R53.83 (ICD-10-CM) - Other  fatigue    THERAPY DIAG:  Imbalance  Difficulty in walking, not elsewhere classified  Muscle weakness (generalized)  Rationale for Evaluation and Treatment Rehabilitation  PERTINENT HISTORY: Patient is an 82 year old male referred for balance and foot drop. 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 Patient's wife reports that patient had small stroke last summer. He has more difficulty with memory. She states he walks more slowly and his wife is concerned he might fall. They report trouble with getting up and down from chair. He reports more "wobbling" and being more unsteady with gait. His wife reports that his "heart is weak" - pt has Hx of CAD, A-fib, and MI. No unexplained weight loss.      Patient Goals: More steady, pt wants to get stronger       PRECAUTIONS: Imbalance/foot drop    SUBJECTIVE:  SUBJECTIVE STATEMENT:  Patient reports no major updates at arrival. He reports feeling somewhat tired, but not having any other notable symptoms. Pt reports no disturbed sleep last night.    PAIN:  Are you having pain? No   OBJECTIVE: (objective measures completed at initial evaluation unless otherwise dated)     TODAY'S TREATMENT 04/04/23   Therapeutic Exercise - improved strength as needed to improve performance of CKC activities/functional movements and as needed for power production to prevent fall during episode of large postural perturbation    *GOAL UPDATE PERFORMED   -NuStep cycling, Level 5 x 5 minutes for improved soft tissue mobility and increased tissue temperature to improve muscle performance  -subjective gathered during portion of time on bike  -3 minutes not billed  PATIENT/FAMILY EDUCATION: Discussed current progress in PT, objective data gathered  today and current goals met. We discussed further POC to address LE strength/power and improved transfers.    *not today* -Sit to stand with 8-lb medball Goblet hold; standard chair without Airex; 1x7, 1x5     Neuromuscular Re-education - for improved sensory integration, static and dynamic postural control, equilibrium and non-equilibrium coordination as needed for negotiating home and community environment and stepping over obstacles   BERG performance (see updated Goal section)   -Obstacle course (in hallway):  6" step up and down --> airex pad step up and over x 2: --> alternating lateral cone taps, 8 cones (4 on each side)  x4 laps, CGA    *not today* -4 square stepping; 5x CCW/CW with PT CGA On blue agility ladder: High knees with opposite knee tap, no weight; 3x D/B High knees with opposite knee tap, with 5-lb ankle weights; 3x D/B // bars: -Tandem walk along blue line; 2x D/B with difficulty maintaining without reliance on bars -Semitandem (3/4 Tandem) standing; 2 x 30 sec, bilat -Standing FT on Airex with perturbations; 2 x 1-minute bouts  -increased intensity of perturbations on second bout  -Standing FT on Airex x 30 sec  -minimal difficulty -Side stepping over 3 6-inch hurdles; 5x D/B length of bars  Lateral step up/down over Airex pad, alternating R/L over pad; 2x10 alternating R<>L -Anterior alternate toe taps, hands free, 2x12, 12" step with 2 # AW's.   -minimal UE support needed today  -Side stepping with Green Tband around ankles: 6x D/B length of // bars; Mod multimodal cuing for staying parallel to walls     PATIENT EDUCATION:  Education details: HEP, demo as well as verbal and tactile curing for exercise technique Person educated: Patient and Spouse Education method: Explanation, Demonstration, and Handouts Education comprehension: verbalized understanding     HOME EXERCISE PROGRAM: Access Code: [email or web address] URL:  [email or web address] Date: 03/18/2023 Prepared by: Consuela Mimes  Exercises - Sit to Stand with Weight  - 2 x daily - 7 x weekly - 2-3 sets - 5-6 reps - Heel Toe Raises with Counter Support  - 2 x daily - 7 x weekly - 2 sets - 10 reps - Standing Hip Abduction with Counter Support  - 2 x daily - 7 x weekly - 2 sets - 10 reps - Standing Anterior Toe Taps  - 2 x daily - 7 x weekly - 2 sets - 10 reps - Standing Romberg to 1/2 Tandem Stance  - 2 x daily - 7 x weekly - 2-3 sets - 30sec hold     ASSESSMENT:   CLINICAL IMPRESSION: Patient has participated very well with PT in clinic, but he has performed  HEP inconsistently recently. He has met majority of PT goals, but he is still limited in sit to stand performance and has not met 5TSTS goal. Pt would likely see further improvement with ability to perform transferring with improved HEP compliance. He fortunately has not experienced recent fall and has benefited from use of dorsiflexion-assist brace discussed earlier in this episode of care. Pt is nearing readiness for D/C, but he would benefit from further work on strengthening and LE power needed for transferring and CKC LE movements needed for ADLs as well as being able to prevent fall. Pt has remaining deficits in functional LE strength for performance of transferring, postural control, and equilibrium coordination necessitating further PT intervention. Patient will benefit from skilled PT to address his noted impairments and improve overall function.   REHAB POTENTIAL: Good   CLINICAL DECISION MAKING: Evolving/moderate complexity   EVALUATION COMPLEXITY: Moderate     GOALS: Goals reviewed with patient? No   SHORT TERM GOALS: Target date: 02/13/2023   Pt will be independent with HEP in order to improve strength and balance in order to decrease fall risk and improve function at home. Baseline: 01/22/23: Baseline HEP initiated.   02/28/23: Compliant with HEP  03/27/23: Inconsistent  HEP compliance Goal status: ON-GOING   Pt will perform independent sit to stand without UE support and symmetrical weightbearing indicative of improved functional LE strength and ability to perform transferring Baseline: 01/22/23: Multiple attempts to perform sit to stand without push on armrests, unable to perform until patient pushed onto anterior thighs.   02/28/23: Sit to stand independent with no UE support, stable upon completion of transfer.  Goal status: ACHIEVED      LONG TERM GOALS: Target date: 04/03/2023   Pt will increase FOTO to at least 57 to demonstrate significant improvement in function at home related to balance  Baseline: 01/22/23: 48.   02/28/23: 73/57 Goal status: ACHIEVED    2.  Pt will improve BERG by at least 3 points in order to demonstrate clinically significant improvement in balance.   Baseline: 01/22/23: Baseline to be performed on visit # 2.   01/24/23: 47/56.   02/28/23: 49/56     03/27/23: 52/56 Goal status: ACHIEVED   3. Pt will perform 5TSTS in less than 12 seconds to demonstrate clinically significant improvement in LE strength      Baseline:  01/22/23: Baseline to be performed as sit to stand improves.   02/28/23: 23.5 sec     03/27/23: 21.3 sec   Goal status: ON-GOING   5. Pt will improve DGI by at least 3 points in order to demonstrate clinically significant improvement in balance and decreased risk for falls.     Baseline: 01/22/23: Baseline to be performed on visit # 2.   01/24/23: 18/24.   02/28/23: 20/24    03/27/23: 22/24 Goal status: ACHIEVED      PLAN: PT FREQUENCY: 2x/week   PT DURATION: 3-4 weeks   PLANNED INTERVENTIONS: Therapeutic exercises, Therapeutic activity, Neuromuscular re-education, Balance training, Gait training, Patient/Family education, Electrical stimulation, Cryotherapy, Moist heat, and Manual therapy   PLAN FOR NEXT SESSION: Continue with LE strengthening, task practice for transferring; improving LE power to prevent fall and reactive balance  strategies.     Consuela Mimes, PT, DPT [phone]  Physical Therapist- [facility]  8:19 AM, 04/04/23

## 2023-04-04 ENCOUNTER — Encounter: Payer: Self-pay | Admitting: Physical Therapy

## 2023-04-04 ENCOUNTER — Ambulatory Visit: Payer: PPO | Admitting: Physical Therapy

## 2023-04-04 DIAGNOSIS — R2689 Other abnormalities of gait and mobility: Secondary | ICD-10-CM

## 2023-04-04 DIAGNOSIS — R262 Difficulty in walking, not elsewhere classified: Secondary | ICD-10-CM

## 2023-04-04 DIAGNOSIS — M6281 Muscle weakness (generalized): Secondary | ICD-10-CM

## 2023-04-04 NOTE — Therapy (Addendum)
OUTPATIENT PHYSICAL THERAPY TREATMENT   Patient Name: Juan [name] MRN: [id number] DOB:Sep 16, 1941, 82 y.o., male Today's Date: 04/04/2023   END OF SESSION:   PT End of Session - 04/04/23 1123     Visit Number 18    Number of Visits 21    Date for PT Re-Evaluation 04/02/23    Authorization Type HTA 2024 - visit limit based on medical necessity    Authorization Time Period IE 01/22/23    Progress Note Due on Visit 20    PT Start Time 1118    PT Stop Time 1159    PT Time Calculation (min) 41 min    Equipment Utilized During Treatment Gait belt    Activity Tolerance Patient tolerated treatment well;No increased pain    Behavior During Therapy WFL for tasks assessed/performed              Past Medical History:  Diagnosis Date   Arthritis    back- low   Atrial fibrillation (HCC)    Cancer (HCC)    skin ( basal) cell , face, head, back, inside L ear   Coronary artery disease    Dysrhythmia    MI (myocardial infarction) (HCC)    Sleep apnea    +Cpap in use nightly , last study- 10 yrs. ago   Stroke [facility])    "light stroke"   Past Surgical History:  Procedure Laterality Date   APPENDECTOMY     BACK SURGERY  09/2011   lumbar fusion    CARDIAC CATHETERIZATION     CARDIAC SURGERY     CARDIAC VALVE REPLACEMENT     CORONARY ARTERY BYPASS GRAFT     EYE SURGERY Bilateral    catarascts removed-    HERNIA REPAIR Bilateral 1946, 1990's   inguinal x3 procedures    LUMBAR LAMINECTOMY/DECOMPRESSION MICRODISCECTOMY Left 08/14/2019   Procedure: Laminectomy for facet/synovial cyst - left - Lumbar three-Lumbar four;  Surgeon: Julio Sicks, MD;  Location: [facility] OR;  Service: Neurosurgery;  Laterality: Left;   TONSILLECTOMY     Patient Active Problem List   Diagnosis Date Noted   Synovial cyst of lumbar facet joint 08/14/2019    PCP: Marguarite Arbour, MD REFERRING PROVIDER: Lonell Face, MD  REFERRING DIAG:  R53.83 (ICD-10-CM) - Other fatigue    THERAPY DIAG:   Imbalance  Difficulty in walking, not elsewhere classified  Muscle weakness (generalized)  Rationale for Evaluation and Treatment Rehabilitation  PERTINENT HISTORY: Patient is an 82 year old male referred for balance and foot drop. 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 Patient's wife reports that patient had small stroke last summer. He has more difficulty with memory. She states he walks more slowly and his wife is concerned he might fall. They report trouble with getting up and down from chair. He reports more "wobbling" and being more unsteady with gait. His wife reports that his "heart is weak" - pt has Hx of CAD, A-fib, and MI. No unexplained weight loss.      Patient Goals: More steady, pt wants to get stronger       PRECAUTIONS: Imbalance/foot drop    SUBJECTIVE:  SUBJECTIVE STATEMENT:  Patient reports feeling generally well at arrival. No new updates today.    PAIN:  Are you having pain? No   OBJECTIVE: (objective measures completed at initial evaluation unless otherwise dated)     TODAY'S TREATMENT 04/04/23   Therapeutic Exercise - improved strength as needed to improve performance of CKC activities/functional movements and as needed for power production to prevent fall during episode of large postural perturbation     -NuStep cycling, Level 6 x 5 minutes for improved soft tissue mobility and increased tissue temperature to improve muscle performance  -subjective gathered during portion of time on bike  -3 minutes not billed  Forward step up to contralateral toe tap; 2x10 with each LE  Sit to stand with 6-lb medball Goblet hold; standard chair without Airex; 1x8; 1x5  PATIENT/FAMILY EDUCATION: Discussed current role of PT and expectations with  visits moving forward     Neuromuscular Re-education - for improved sensory integration, static and dynamic postural control, equilibrium and non-equilibrium coordination as needed for negotiating home and community environment and stepping over obstacles  Multi-directional step on blue star; 5 directions (lateral R/L, anterolateral R/L, anterior); x 5 ea dir  -Obstacle course (in hallway):  6" step up and down --> airex pad step up and over x 2: --> alternating lateral cone taps, 8 cones (4 on each side)  x4 laps, CGA  -Split stance perturbations; x 1 minute with RLE in back and LLE in back   -Gait with ball toss in hallway; x 3 D/B length of hall (70-ft hall)   *next visit* -Standing FT with wall bounce;  -4 square stepping; 5x CCW/CW with PT CGA   *not today* On blue agility ladder: High knees with opposite knee tap, no weight; 3x D/B High knees with opposite knee tap, with 5-lb ankle weights; 3x D/B // bars: -Tandem walk along blue line; 2x D/B with difficulty maintaining without reliance on bars -Semitandem (3/4 Tandem) standing; 2 x 30 sec, bilat -Standing FT on Airex with perturbations; 2 x 1-minute bouts  -increased intensity of perturbations on second bout  -Standing FT on Airex x 30 sec  -minimal difficulty -Side stepping over 3 6-inch hurdles; 5x D/B length of bars  Lateral step up/down over Airex pad, alternating R/L over pad; 2x10 alternating R<>L -Anterior alternate toe taps, hands free, 2x12, 12" step with 2 # AW's.   -minimal UE support needed today  -Side stepping with Green Tband around ankles: 6x D/B length of // bars; Mod multimodal cuing for staying parallel to walls     PATIENT EDUCATION:  Education details: HEP, demo as well as verbal and tactile curing for exercise technique Person educated: Patient and Spouse Education method: Explanation, Demonstration, and Handouts Education comprehension: verbalized understanding     HOME EXERCISE  PROGRAM: Access Code: [email or web address] URL: [email or web address] Date: 03/18/2023 Prepared by: Consuela Mimes  Exercises - Sit to Stand with Weight  - 2 x daily - 7 x weekly - 2-3 sets - 5-6 reps - Heel Toe Raises with Counter Support  - 2 x daily - 7 x weekly - 2 sets - 10 reps - Standing Hip Abduction with Counter Support  - 2 x daily - 7 x weekly - 2 sets - 10 reps - Standing Anterior Toe Taps  - 2 x daily - 7 x weekly - 2 sets - 10 reps - Standing Romberg to 1/2 Tandem Stance  - 2 x daily - 7 x weekly - 2-3 sets -  30sec hold     ASSESSMENT:   CLINICAL IMPRESSION: We focused on LE strengthening with primarily closed-chain movements to improve patient's capacity for transferring and power production to prevent fall. We utilized exercises requiring relatively quick reaction for reactive balance strategies. Pt tolerates session well and does not have significant LOB with activities performed. Pt does still have room for improvement with sit to stand performance. Pt has remaining deficits in functional LE strength for performance of transferring, postural control, and equilibrium coordination necessitating further PT intervention. Patient will benefit from skilled PT to address his noted impairments and improve overall function.   REHAB POTENTIAL: Good   CLINICAL DECISION MAKING: Evolving/moderate complexity   EVALUATION COMPLEXITY: Moderate     GOALS: Goals reviewed with patient? No   SHORT TERM GOALS: Target date: 02/13/2023   Pt will be independent with HEP in order to improve strength and balance in order to decrease fall risk and improve function at home. Baseline: 01/22/23: Baseline HEP initiated.   02/28/23: Compliant with HEP  03/27/23: Inconsistent HEP compliance Goal status: ON-GOING   Pt will perform independent sit to stand without UE support and symmetrical weightbearing indicative of improved functional LE strength and ability to perform transferring Baseline:  01/22/23: Multiple attempts to perform sit to stand without push on armrests, unable to perform until patient pushed onto anterior thighs.   02/28/23: Sit to stand independent with no UE support, stable upon completion of transfer.  Goal status: ACHIEVED      LONG TERM GOALS: Target date: 04/03/2023   Pt will increase FOTO to at least 57 to demonstrate significant improvement in function at home related to balance  Baseline: 01/22/23: 48.   02/28/23: 73/57 Goal status: ACHIEVED    2.  Pt will improve BERG by at least 3 points in order to demonstrate clinically significant improvement in balance.   Baseline: 01/22/23: Baseline to be performed on visit # 2.   01/24/23: 47/56.   02/28/23: 49/56     03/27/23: 52/56 Goal status: ACHIEVED   3. Pt will perform 5TSTS in less than 12 seconds to demonstrate clinically significant improvement in LE strength      Baseline:  01/22/23: Baseline to be performed as sit to stand improves.   02/28/23: 23.5 sec     03/27/23: 21.3 sec   Goal status: ON-GOING   5. Pt will improve DGI by at least 3 points in order to demonstrate clinically significant improvement in balance and decreased risk for falls.     Baseline: 01/22/23: Baseline to be performed on visit # 2.   01/24/23: 18/24.   02/28/23: 20/24    03/27/23: 22/24 Goal status: ACHIEVED      PLAN: PT FREQUENCY: 2x/week   PT DURATION: 3-4 weeks   PLANNED INTERVENTIONS: Therapeutic exercises, Therapeutic activity, Neuromuscular re-education, Balance training, Gait training, Patient/Family education, Electrical stimulation, Cryotherapy, Moist heat, and Manual therapy   PLAN FOR NEXT SESSION: Continue with LE strengthening, task practice for transferring; improving LE power to prevent fall and reactive balance strategies.      Consuela Mimes, PT, DPT [phone]  Physical Therapist- [facility]  11:23 AM, 04/04/23

## 2023-04-08 DIAGNOSIS — E782 Mixed hyperlipidemia: Secondary | ICD-10-CM | POA: Diagnosis not present

## 2023-04-08 DIAGNOSIS — I5020 Unspecified systolic (congestive) heart failure: Secondary | ICD-10-CM | POA: Diagnosis not present

## 2023-04-08 DIAGNOSIS — T466X5A Adverse effect of antihyperlipidemic and antiarteriosclerotic drugs, initial encounter: Secondary | ICD-10-CM | POA: Diagnosis not present

## 2023-04-08 DIAGNOSIS — Z951 Presence of aortocoronary bypass graft: Secondary | ICD-10-CM | POA: Diagnosis not present

## 2023-04-08 DIAGNOSIS — Z79899 Other long term (current) drug therapy: Secondary | ICD-10-CM | POA: Diagnosis not present

## 2023-04-08 DIAGNOSIS — Z125 Encounter for screening for malignant neoplasm of prostate: Secondary | ICD-10-CM | POA: Diagnosis not present

## 2023-04-08 DIAGNOSIS — I482 Chronic atrial fibrillation, unspecified: Secondary | ICD-10-CM | POA: Diagnosis not present

## 2023-04-08 DIAGNOSIS — R7303 Prediabetes: Secondary | ICD-10-CM | POA: Diagnosis not present

## 2023-04-08 DIAGNOSIS — E559 Vitamin D deficiency, unspecified: Secondary | ICD-10-CM | POA: Diagnosis not present

## 2023-04-08 DIAGNOSIS — G720 Drug-induced myopathy: Secondary | ICD-10-CM | POA: Diagnosis not present

## 2023-04-09 ENCOUNTER — Ambulatory Visit: Payer: PPO | Admitting: Physical Therapy

## 2023-04-09 DIAGNOSIS — Z951 Presence of aortocoronary bypass graft: Secondary | ICD-10-CM | POA: Diagnosis not present

## 2023-04-09 DIAGNOSIS — T466X5A Adverse effect of antihyperlipidemic and antiarteriosclerotic drugs, initial encounter: Secondary | ICD-10-CM | POA: Diagnosis not present

## 2023-04-09 DIAGNOSIS — I482 Chronic atrial fibrillation, unspecified: Secondary | ICD-10-CM | POA: Diagnosis not present

## 2023-04-09 DIAGNOSIS — I5020 Unspecified systolic (congestive) heart failure: Secondary | ICD-10-CM | POA: Diagnosis not present

## 2023-04-09 DIAGNOSIS — G720 Drug-induced myopathy: Secondary | ICD-10-CM | POA: Diagnosis not present

## 2023-04-09 DIAGNOSIS — Z125 Encounter for screening for malignant neoplasm of prostate: Secondary | ICD-10-CM | POA: Diagnosis not present

## 2023-04-09 DIAGNOSIS — E782 Mixed hyperlipidemia: Secondary | ICD-10-CM | POA: Diagnosis not present

## 2023-04-09 DIAGNOSIS — Z79899 Other long term (current) drug therapy: Secondary | ICD-10-CM | POA: Diagnosis not present

## 2023-04-09 DIAGNOSIS — R7303 Prediabetes: Secondary | ICD-10-CM | POA: Diagnosis not present

## 2023-04-09 DIAGNOSIS — E559 Vitamin D deficiency, unspecified: Secondary | ICD-10-CM | POA: Diagnosis not present

## 2023-04-11 ENCOUNTER — Ambulatory Visit: Payer: PPO | Admitting: Physical Therapy

## 2023-04-11 DIAGNOSIS — R2689 Other abnormalities of gait and mobility: Secondary | ICD-10-CM | POA: Diagnosis not present

## 2023-04-11 DIAGNOSIS — R262 Difficulty in walking, not elsewhere classified: Secondary | ICD-10-CM

## 2023-04-11 DIAGNOSIS — M6281 Muscle weakness (generalized): Secondary | ICD-10-CM

## 2023-04-11 NOTE — Therapy (Unsigned)
OUTPATIENT PHYSICAL THERAPY TREATMENT   Patient Name: Juan [name] MRN: [id number] DOB:May 18, 1941, 82 y.o., male Today's Date: 04/11/2023   END OF SESSION:   PT End of Session - 04/11/23 1608     Visit Number 19    Number of Visits 21    Date for PT Re-Evaluation 04/02/23    Authorization Type HTA 2024 - visit limit based on medical necessity    Authorization Time Period IE 01/22/23    Progress Note Due on Visit 20    PT Start Time 1603    PT Stop Time 1644    PT Time Calculation (min) 41 min    Equipment Utilized During Treatment Gait belt    Activity Tolerance Patient tolerated treatment well;No increased pain    Behavior During Therapy WFL for tasks assessed/performed               Past Medical History:  Diagnosis Date   Arthritis    back- low   Atrial fibrillation (HCC)    Cancer (HCC)    skin ( basal) cell , face, head, back, inside L ear   Coronary artery disease    Dysrhythmia    MI (myocardial infarction) (HCC)    Sleep apnea    +Cpap in use nightly , last study- 10 yrs. ago   Stroke [facility])    "light stroke"   Past Surgical History:  Procedure Laterality Date   APPENDECTOMY     BACK SURGERY  09/2011   lumbar fusion    CARDIAC CATHETERIZATION     CARDIAC SURGERY     CARDIAC VALVE REPLACEMENT     CORONARY ARTERY BYPASS GRAFT     EYE SURGERY Bilateral    catarascts removed-    HERNIA REPAIR Bilateral 1946, 1990's   inguinal x3 procedures    LUMBAR LAMINECTOMY/DECOMPRESSION MICRODISCECTOMY Left 08/14/2019   Procedure: Laminectomy for facet/synovial cyst - left - Lumbar three-Lumbar four;  Surgeon: Julio Sicks, MD;  Location: [facility] OR;  Service: Neurosurgery;  Laterality: Left;   TONSILLECTOMY     Patient Active Problem List   Diagnosis Date Noted   Synovial cyst of lumbar facet joint 08/14/2019    PCP: Marguarite Arbour, MD REFERRING PROVIDER: Lonell Face, MD  REFERRING DIAG:  R53.83 (ICD-10-CM) - Other fatigue    THERAPY DIAG:   Imbalance  Difficulty in walking, not elsewhere classified  Muscle weakness (generalized)  Rationale for Evaluation and Treatment Rehabilitation  PERTINENT HISTORY: Patient is an 82 year old male referred for balance and foot drop. 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 Patient's wife reports that patient had small stroke last summer. He has more difficulty with memory. She states he walks more slowly and his wife is concerned he might fall. They report trouble with getting up and down from chair. He reports more "wobbling" and being more unsteady with gait. His wife reports that his "heart is weak" - pt has Hx of CAD, A-fib, and MI. No unexplained weight loss.      Patient Goals: More steady, pt wants to get stronger       PRECAUTIONS: Imbalance/foot drop    SUBJECTIVE:  SUBJECTIVE STATEMENT:  Patient reports no new complaints or recent near-falls or safety incidents.    PAIN:  Are you having pain? No   OBJECTIVE: (objective measures completed at initial evaluation unless otherwise dated)     TODAY'S TREATMENT 04/11/23   Therapeutic Exercise - improved strength as needed to improve performance of CKC activities/functional movements and as needed for power production to prevent fall during episode of large postural perturbation     -NuStep cycling, Level 6 x 5 minutes for improved soft tissue mobility and increased tissue temperature to improve muscle performance  -subjective gathered during portion of time on bike  -3 minutes not billed  Forward step up to contralateral toe tap; 2x10 with each LE  Sit to stand with 6-lb medball Goblet hold; standard chair without Airex; 1x4, 1x5  PATIENT/FAMILY EDUCATION: Discussed current role of PT and expectations with  visits moving forward     Neuromuscular Re-education - for improved sensory integration, static and dynamic postural control, equilibrium and non-equilibrium coordination as needed for negotiating home and community environment and stepping over obstacles   -Split stance perturbations; x 1 minute with RLE in back and LLE in back   -Gait with ball toss in hallway; x 3 D/B length of hall (70-ft hall)  -Standing FT with wall bounce; x 25 bounces  -4 square stepping; 5x CCW/CW with PT CGA    *not today -Multi-directional step on blue star; 5 directions (lateral R/L, anterolateral R/L, anterior); x 5 ea dir -Obstacle course (in hallway):  6" step up and down --> airex pad step up and over x 2: --> alternating lateral cone taps, 8 cones (4 on each side)  x4 laps, CGA On blue agility ladder: High knees with opposite knee tap, no weight; 3x D/B High knees with opposite knee tap, with 5-lb ankle weights; 3x D/B // bars: -Tandem walk along blue line; 2x D/B with difficulty maintaining without reliance on bars -Semitandem (3/4 Tandem) standing; 2 x 30 sec, bilat -Standing FT on Airex with perturbations; 2 x 1-minute bouts  -increased intensity of perturbations on second bout  -Standing FT on Airex x 30 sec  -minimal difficulty -Side stepping over 3 6-inch hurdles; 5x D/B length of bars  Lateral step up/down over Airex pad, alternating R/L over pad; 2x10 alternating R<>L -Anterior alternate toe taps, hands free, 2x12, 12" step with 2 # AW's.   -minimal UE support needed today  -Side stepping with Green Tband around ankles: 6x D/B length of // bars; Mod multimodal cuing for staying parallel to walls     PATIENT EDUCATION:  Education details: HEP, demo as well as verbal and tactile curing for exercise technique Person educated: Patient and Spouse Education method: Explanation, Demonstration, and Handouts Education comprehension: verbalized understanding     HOME EXERCISE  PROGRAM: Access Code: [email or web address] URL: [email or web address] Date: 03/18/2023 Prepared by: Consuela Mimes  Exercises - Sit to Stand with Weight  - 2 x daily - 7 x weekly - 2-3 sets - 5-6 reps - Heel Toe Raises with Counter Support  - 2 x daily - 7 x weekly - 2 sets - 10 reps - Standing Hip Abduction with Counter Support  - 2 x daily - 7 x weekly - 2 sets - 10 reps - Standing Anterior Toe Taps  - 2 x daily - 7 x weekly - 2 sets - 10 reps - Standing Romberg to 1/2 Tandem Stance  - 2 x daily - 7 x weekly - 2-3 sets -  30sec hold     ASSESSMENT:   CLINICAL IMPRESSION: We focused on LE strengthening with primarily closed-chain movements to improve patient's capacity for transferring and power production to prevent fall. We utilized exercises requiring relatively quick reaction for reactive balance strategies. Pt tolerates session well and does not have significant LOB with activities performed. Pt does still have room for improvement with sit to stand performance. Pt has remaining deficits in functional LE strength for performance of transferring, postural control, and equilibrium coordination necessitating further PT intervention. Patient will benefit from skilled PT to address his noted impairments and improve overall function.   REHAB POTENTIAL: Good   CLINICAL DECISION MAKING: Evolving/moderate complexity   EVALUATION COMPLEXITY: Moderate     GOALS: Goals reviewed with patient? No   SHORT TERM GOALS: Target date: 02/13/2023   Pt will be independent with HEP in order to improve strength and balance in order to decrease fall risk and improve function at home. Baseline: 01/22/23: Baseline HEP initiated.   02/28/23: Compliant with HEP  03/27/23: Inconsistent HEP compliance Goal status: ON-GOING   Pt will perform independent sit to stand without UE support and symmetrical weightbearing indicative of improved functional LE strength and ability to perform transferring Baseline:  01/22/23: Multiple attempts to perform sit to stand without push on armrests, unable to perform until patient pushed onto anterior thighs.   02/28/23: Sit to stand independent with no UE support, stable upon completion of transfer.  Goal status: ACHIEVED      LONG TERM GOALS: Target date: 04/03/2023   Pt will increase FOTO to at least 57 to demonstrate significant improvement in function at home related to balance  Baseline: 01/22/23: 48.   02/28/23: 73/57 Goal status: ACHIEVED    2.  Pt will improve BERG by at least 3 points in order to demonstrate clinically significant improvement in balance.   Baseline: 01/22/23: Baseline to be performed on visit # 2.   01/24/23: 47/56.   02/28/23: 49/56     03/27/23: 52/56 Goal status: ACHIEVED   3. Pt will perform 5TSTS in less than 12 seconds to demonstrate clinically significant improvement in LE strength      Baseline:  01/22/23: Baseline to be performed as sit to stand improves.   02/28/23: 23.5 sec     03/27/23: 21.3 sec   Goal status: ON-GOING   5. Pt will improve DGI by at least 3 points in order to demonstrate clinically significant improvement in balance and decreased risk for falls.     Baseline: 01/22/23: Baseline to be performed on visit # 2.   01/24/23: 18/24.   02/28/23: 20/24    03/27/23: 22/24 Goal status: ACHIEVED      PLAN: PT FREQUENCY: 2x/week   PT DURATION: 3-4 weeks   PLANNED INTERVENTIONS: Therapeutic exercises, Therapeutic activity, Neuromuscular re-education, Balance training, Gait training, Patient/Family education, Electrical stimulation, Cryotherapy, Moist heat, and Manual therapy   PLAN FOR NEXT SESSION: Continue with LE strengthening, task practice for transferring; improving LE power to prevent fall and reactive balance strategies.      Consuela Mimes, PT, DPT [phone]  Physical Therapist- [facility]  4:08 PM, 04/11/23

## 2023-04-15 ENCOUNTER — Encounter: Payer: Self-pay | Admitting: Physical Therapy

## 2023-04-15 ENCOUNTER — Ambulatory Visit: Payer: PPO | Admitting: Physical Therapy

## 2023-04-15 DIAGNOSIS — M6281 Muscle weakness (generalized): Secondary | ICD-10-CM

## 2023-04-15 DIAGNOSIS — R2689 Other abnormalities of gait and mobility: Secondary | ICD-10-CM

## 2023-04-15 DIAGNOSIS — R262 Difficulty in walking, not elsewhere classified: Secondary | ICD-10-CM

## 2023-04-15 NOTE — Therapy (Unsigned)
OUTPATIENT PHYSICAL THERAPY TREATMENT AND PROGRESS NOTE   Dates of reporting period  03/27/23   to   04/15/23    Patient Name: Juan [name] MRN: [id number] DOB:October 22, 1940, 82 y.o., male Today's Date: 04/15/2023   END OF SESSION:   PT End of Session - 04/15/23 1409     Visit Number 20    Number of Visits 22    Date for PT Re-Evaluation 04/25/23    Authorization Type HTA 2024 - visit limit based on medical necessity    Authorization Time Period IE 01/22/23    Progress Note Due on Visit --    PT Start Time 1410    PT Stop Time 1453    PT Time Calculation (min) 43 min    Equipment Utilized During Treatment Gait belt    Activity Tolerance Patient tolerated treatment well;No increased pain    Behavior During Therapy WFL for tasks assessed/performed              Past Medical History:  Diagnosis Date   Arthritis    back- low   Atrial fibrillation (HCC)    Cancer (HCC)    skin ( basal) cell , face, head, back, inside L ear   Coronary artery disease    Dysrhythmia    MI (myocardial infarction) (HCC)    Sleep apnea    +Cpap in use nightly , last study- 10 yrs. ago   Stroke [facility])    "light stroke"   Past Surgical History:  Procedure Laterality Date   APPENDECTOMY     BACK SURGERY  09/2011   lumbar fusion    CARDIAC CATHETERIZATION     CARDIAC SURGERY     CARDIAC VALVE REPLACEMENT     CORONARY ARTERY BYPASS GRAFT     EYE SURGERY Bilateral    catarascts removed-    HERNIA REPAIR Bilateral 1946, 1990's   inguinal x3 procedures    LUMBAR LAMINECTOMY/DECOMPRESSION MICRODISCECTOMY Left 08/14/2019   Procedure: Laminectomy for facet/synovial cyst - left - Lumbar three-Lumbar four;  Surgeon: Julio Sicks, MD;  Location: [facility] OR;  Service: Neurosurgery;  Laterality: Left;   TONSILLECTOMY     Patient Active Problem List   Diagnosis Date Noted   Synovial cyst of lumbar facet joint 08/14/2019    PCP: Marguarite Arbour, MD REFERRING PROVIDER: Lonell Face,  MD  REFERRING DIAG:  R53.83 (ICD-10-CM) - Other fatigue    THERAPY DIAG:  Imbalance  Difficulty in walking, not elsewhere classified  Muscle weakness (generalized)  Rationale for Evaluation and Treatment Rehabilitation  PERTINENT HISTORY: Patient is an 82 year old male referred for balance and foot drop. 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 Patient's wife reports that patient had small stroke last summer. He has more difficulty with memory. She states he walks more slowly and his wife is concerned he might fall. They report trouble with getting up and down from chair. He reports more "wobbling" and being more unsteady with gait. His wife reports that his "heart is weak" - pt has Hx of CAD, A-fib, and MI. No unexplained weight loss.      Patient Goals: More steady, pt wants to get stronger       PRECAUTIONS: Imbalance/foot drop    SUBJECTIVE:  SUBJECTIVE STATEMENT:  Patient reports no significant issues since last visit. He reports limited compliance with HEP. Patient reports no near-falls or safety incidents recently. Patient reports 50% global rating of improvement at this time. Patient reports no specific functional mobility deficits recently.    PAIN:  Are you having pain? No   OBJECTIVE: (objective measures completed at initial evaluation unless otherwise dated)     TODAY'S TREATMENT 04/15/23   Therapeutic Exercise - improved strength as needed to improve performance of CKC activities/functional movements and as needed for power production to prevent fall during episode of large postural perturbation   *GOAL UPDATE PERFORMED  -NuStep cycling, Level 6 x 5 minutes for improved soft tissue mobility and increased tissue temperature to improve muscle  performance  -subjective gathered during portion of time on bike  -3 minutes not billed  Forward step up to contralateral toe tap; 2x10 with each LE   PATIENT/FAMILY EDUCATION: Discussed current role of PT and expectations with visits moving forward    *not today* Sit to stand with 6-lb medball Goblet hold; standard chair without Airex; 1x4, 1x5   Neuromuscular Re-education - for improved sensory integration, static and dynamic postural control, equilibrium and non-equilibrium coordination as needed for negotiating home and community environment and stepping over obstacles   -Standing FT with wall bounce; x 30 bounces  -Obstacle course (in hallway):  6" step up and down --> airex pad step up and over x 2: --> alternating lateral cone taps, 8 cones (4 on each side)  x4 laps, CGA   *next visit* -Split stance perturbations; x 1 minute with RLE in back and LLE in back   *not today* -Gait with ball toss in hallway; x 3 D/B length of hall (70-ft hall) -4 square stepping; 5x CCW/CW with PT CGA -Multi-directional step on blue star; 5 directions (lateral R/L, anterolateral R/L, anterior); x 5 ea dir On blue agility ladder: High knees with opposite knee tap, no weight; 3x D/B High knees with opposite knee tap, with 5-lb ankle weights; 3x D/B // bars: -Tandem walk along blue line; 2x D/B with difficulty maintaining without reliance on bars -Semitandem (3/4 Tandem) standing; 2 x 30 sec, bilat -Standing FT on Airex with perturbations; 2 x 1-minute bouts  -increased intensity of perturbations on second bout  -Standing FT on Airex x 30 sec  -minimal difficulty -Side stepping over 3 6-inch hurdles; 5x D/B length of bars  Lateral step up/down over Airex pad, alternating R/L over pad; 2x10 alternating R<>L -Anterior alternate toe taps, hands free, 2x12, 12" step with 2 # AW's.   -minimal UE support needed today  -Side stepping with Green Tband around ankles: 6x D/B length of // bars; Mod  multimodal cuing for staying parallel to walls     PATIENT EDUCATION:  Education details: HEP, demo as well as verbal and tactile curing for exercise technique Person educated: Patient and Spouse Education method: Explanation, Demonstration, and Handouts Education comprehension: verbalized understanding     HOME EXERCISE PROGRAM: Access Code: [email or web address] URL: [email or web address] Date: 03/18/2023 Prepared by: Consuela Mimes  Exercises - Sit to Stand with Weight  - 2 x daily - 7 x weekly - 2-3 sets - 5-6 reps - Heel Toe Raises with Counter Support  - 2 x daily - 7 x weekly - 2 sets - 10 reps - Standing Hip Abduction with Counter Support  - 2 x daily - 7 x weekly - 2 sets - 10 reps - Standing Anterior Toe Taps  -  2 x daily - 7 x weekly - 2 sets - 10 reps - Standing Romberg to 1/2 Tandem Stance  - 2 x daily - 7 x weekly - 2-3 sets - 30sec hold     ASSESSMENT:   CLINICAL IMPRESSION: Pt has improved sit to stand relative to baseline and has markedly improved with outcome measure related to balance and fall risk. Sit to stand volume is less today than last visit, and this may reflect limited carryover of strengthening program and performance of sit to stands at home. Pt participates well with PT and performs well with stepping tasks and dynamic balance drills. Pt has remaining deficits in functional LE strength for performance of transferring, postural control, and equilibrium coordination necessitating further PT intervention. Patient will benefit from skilled PT to address his noted impairments and improve overall function.   REHAB POTENTIAL: Good   CLINICAL DECISION MAKING: Evolving/moderate complexity   EVALUATION COMPLEXITY: Moderate     GOALS: Goals reviewed with patient? No   SHORT TERM GOALS: Target date: 02/13/2023   Pt will be independent with HEP in order to improve strength and balance in order to decrease fall risk and improve function at home. Baseline:  01/22/23: Baseline HEP initiated.   02/28/23: Compliant with HEP     03/27/23: Inconsistent HEP compliance.   04/15/23: Pt reports limited compliance with HEP.  Goal status: NOT MET    Pt will perform independent sit to stand without UE support and symmetrical weightbearing indicative of improved functional LE strength and ability to perform transferring Baseline: 01/22/23: Multiple attempts to perform sit to stand without push on armrests, unable to perform until patient pushed onto anterior thighs.   02/28/23: Sit to stand independent with no UE support, stable upon completion of transfer.  Goal status: ACHIEVED      LONG TERM GOALS: Target date: 04/03/2023   Pt will increase FOTO to at least 57 to demonstrate significant improvement in function at home related to balance  Baseline: 01/22/23: 48.   02/28/23: 73/57 Goal status: ACHIEVED    2.  Pt will improve BERG by at least 3 points in order to demonstrate clinically significant improvement in balance.   Baseline: 01/22/23: Baseline to be performed on visit # 2.   01/24/23: 47/56.   02/28/23: 49/56     03/27/23: 52/56 Goal status: ACHIEVED   3. Pt will perform 5TSTS in less than 12 seconds to demonstrate clinically significant improvement in LE strength      Baseline:  01/22/23: Baseline to be performed as sit to stand improves.   02/28/23: 23.5 sec     03/27/23: 21.3 sec    04/15/23: 15.9 sec  Goal status: IN PROGRESS    5. Pt will improve DGI by at least 3 points in order to demonstrate clinically significant improvement in balance and decreased risk for falls.     Baseline: 01/22/23: Baseline to be performed on visit # 2.   01/24/23: 18/24.   02/28/23: 20/24    03/27/23: 22/24 Goal status: ACHIEVED      PLAN: PT FREQUENCY: 2x/week   PT DURATION: 1-2 weeks   PLANNED INTERVENTIONS: Therapeutic exercises, Therapeutic activity, Neuromuscular re-education, Balance training, Gait training, Patient/Family education, Electrical stimulation, Cryotherapy, Moist heat, and  Manual therapy   PLAN FOR NEXT SESSION: Continue with LE strengthening, task practice for transferring; improving LE power to prevent fall and reactive balance strategies.      Consuela Mimes, PT, DPT [phone]  Physical Therapist- [place]  [facility]  2:57 PM, 04/15/23

## 2023-04-17 ENCOUNTER — Encounter: Payer: Self-pay | Admitting: Physical Therapy

## 2023-04-17 ENCOUNTER — Ambulatory Visit: Payer: PPO | Admitting: Physical Therapy

## 2023-04-17 DIAGNOSIS — R262 Difficulty in walking, not elsewhere classified: Secondary | ICD-10-CM

## 2023-04-17 DIAGNOSIS — R2689 Other abnormalities of gait and mobility: Secondary | ICD-10-CM | POA: Diagnosis not present

## 2023-04-17 DIAGNOSIS — M6281 Muscle weakness (generalized): Secondary | ICD-10-CM

## 2023-04-17 NOTE — Therapy (Signed)
OUTPATIENT PHYSICAL THERAPY TREATMENT    Patient Name: Juan [name] MRN: [id number] DOB:[birth year], 82 y.o., male Today's Date: 04/17/2023   END OF SESSION:   PT End of Session - 04/17/23 1413     Visit Number 21    Number of Visits 22    Date for PT Re-Evaluation 04/25/23    Authorization Type HTA 2024 - visit limit based on medical necessity    Authorization Time Period IE 01/22/23    PT Start Time 1415    PT Stop Time 1455    PT Time Calculation (min) 40 min    Equipment Utilized During Treatment Gait belt    Activity Tolerance Patient tolerated treatment well;No increased pain    Behavior During Therapy WFL for tasks assessed/performed               Past Medical History:  Diagnosis Date   Arthritis    back- low   Atrial fibrillation (HCC)    Cancer (HCC)    skin ( basal) cell , face, head, back, inside L ear   Coronary artery disease    Dysrhythmia    MI (myocardial infarction) (HCC)    Sleep apnea    +Cpap in use nightly , last study- 10 yrs. ago   Stroke [facility])    "light stroke"   Past Surgical History:  Procedure Laterality Date   APPENDECTOMY     BACK SURGERY  09/2011   lumbar fusion    CARDIAC CATHETERIZATION     CARDIAC SURGERY     CARDIAC VALVE REPLACEMENT     CORONARY ARTERY BYPASS GRAFT     EYE SURGERY Bilateral    catarascts removed-    HERNIA REPAIR Bilateral 1946, 1990's   inguinal x3 procedures    LUMBAR LAMINECTOMY/DECOMPRESSION MICRODISCECTOMY Left 08/14/2019   Procedure: Laminectomy for facet/synovial cyst - left - Lumbar three-Lumbar four;  Surgeon: Julio Sicks, MD;  Location: [facility] OR;  Service: Neurosurgery;  Laterality: Left;   TONSILLECTOMY     Patient Active Problem List   Diagnosis Date Noted   Synovial cyst of lumbar facet joint 08/14/2019    PCP: Marguarite Arbour, MD REFERRING PROVIDER: Lonell Face, MD  REFERRING DIAG:  R53.83 (ICD-10-CM) - Other fatigue    THERAPY DIAG:  Imbalance  Difficulty in walking,  not elsewhere classified  Muscle weakness (generalized)  Rationale for Evaluation and Treatment Rehabilitation  PERTINENT HISTORY: Patient is an 82 year old male referred for balance and foot drop. 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 Patient's wife reports that patient had small stroke last summer. He has more difficulty with memory. She states he walks more slowly and his wife is concerned he might fall. They report trouble with getting up and down from chair. He reports more "wobbling" and being more unsteady with gait. His wife reports that his "heart is weak" - pt has Hx of CAD, A-fib, and MI. No unexplained weight loss.      Patient Goals: More steady, pt wants to get stronger       PRECAUTIONS: Imbalance/foot drop    SUBJECTIVE:  SUBJECTIVE STATEMENT:  Patient reports feeling generally well today. No new concerns or safety incidents since last follow-up visit.    PAIN:  Are you having pain? No   OBJECTIVE: (objective measures completed at initial evaluation unless otherwise dated)     TODAY'S TREATMENT 04/17/23    Therapeutic Exercise - improved strength as needed to improve performance of CKC activities/functional movements and as needed for power production to prevent fall during episode of large postural perturbation    -NuStep cycling, Level 6 x 5 minutes for improved soft tissue mobility and increased tissue temperature to improve muscle performance  -subjective gathered during portion of time on bike  -3 minutes not billed  Sit to stand with 6-lb medball Goblet hold; standard chair without Airex; 1x7, 1x9  Forward mini-lunge, alternating R/L in // bars; x15 alternating side to side    PATIENT/FAMILY EDUCATION: Discussed current role of PT and  expectations with visits moving forward    *not today* Forward step up to contralateral toe tap; 2x10 with each LE     Neuromuscular Re-education - for improved sensory integration, static and dynamic postural control, equilibrium and non-equilibrium coordination as needed for negotiating home and community environment and stepping over obstacles   -Standing FT with wall bounce; 2 x 30 bounces  -Obstacle course (in hallway):  6" step up and down --> airex pad step up and over x 2: --> long Airex step-over with pad oriented horizontally   x5 laps D/B, CGA  -Split stance perturbations; x 1 minute with RLE in back and LLE in back   -4 square stepping; 6x CCW/CW with PT CGA  -Full Tandem stance attempted, unable to maintain -3/4 Tandem stance; 2 x 30 sec each position    *not today* -Gait with ball toss in hallway; x 3 D/B length of hall (70-ft hall) -Multi-directional step on blue star; 5 directions (lateral R/L, anterolateral R/L, anterior); x 5 ea dir On blue agility ladder: High knees with opposite knee tap, no weight; 3x D/B High knees with opposite knee tap, with 5-lb ankle weights; 3x D/B // bars: -Tandem walk along blue line; 2x D/B with difficulty maintaining without reliance on bars -Semitandem (3/4 Tandem) standing; 2 x 30 sec, bilat -Standing FT on Airex with perturbations; 2 x 1-minute bouts  -increased intensity of perturbations on second bout  -Standing FT on Airex x 30 sec  -minimal difficulty -Side stepping over 3 6-inch hurdles; 5x D/B length of bars  Lateral step up/down over Airex pad, alternating R/L over pad; 2x10 alternating R<>L -Anterior alternate toe taps, hands free, 2x12, 12" step with 2 # AW's.   -minimal UE support needed today  -Side stepping with Green Tband around ankles: 6x D/B length of // bars; Mod multimodal cuing for staying parallel to walls     PATIENT EDUCATION:  Education details: HEP, demo as well as verbal and tactile curing for  exercise technique Person educated: Patient and Spouse Education method: Explanation, Demonstration, and Handouts Education comprehension: verbalized understanding     HOME EXERCISE PROGRAM: Access Code: [email or web address] URL: [email or web address] Date: 03/18/2023 Prepared by: Consuela Mimes  Exercises - Sit to Stand with Weight  - 2 x daily - 7 x weekly - 2-3 sets - 5-6 reps - Heel Toe Raises with Counter Support  - 2 x daily - 7 x weekly - 2 sets - 10 reps - Standing Hip Abduction with Counter Support  - 2 x daily - 7 x weekly - 2 sets - 10  reps - Standing Anterior Toe Taps  - 2 x daily - 7 x weekly - 2 sets - 10 reps - Standing Romberg to 1/2 Tandem Stance  - 2 x daily - 7 x weekly - 2-3 sets - 30sec hold     ASSESSMENT:   CLINICAL IMPRESSION: Pt demonstrates safe negotiation of uneven terrain and obstacles in gym. He does still have some static postural control deficits likely associated with LE sensory changes. Pt is able to increase volume of sit to stands today compared to last visit. Pt has progressed well and will likely be able to discharge following completion of next week's appointments. Pt has remaining deficits in functional LE strength for performance of transferring, postural control, and equilibrium coordination necessitating further PT intervention. Patient will benefit from skilled PT to address his noted impairments and improve overall function.   REHAB POTENTIAL: Good   CLINICAL DECISION MAKING: Evolving/moderate complexity   EVALUATION COMPLEXITY: Moderate     GOALS: Goals reviewed with patient? No   SHORT TERM GOALS: Target date: 02/13/2023   Pt will be independent with HEP in order to improve strength and balance in order to decrease fall risk and improve function at home. Baseline: 01/22/23: Baseline HEP initiated.   02/28/23: Compliant with HEP     03/27/23: Inconsistent HEP compliance.   04/15/23: Pt reports limited compliance with HEP.  Goal status:  NOT MET    Pt will perform independent sit to stand without UE support and symmetrical weightbearing indicative of improved functional LE strength and ability to perform transferring Baseline: 01/22/23: Multiple attempts to perform sit to stand without push on armrests, unable to perform until patient pushed onto anterior thighs.   02/28/23: Sit to stand independent with no UE support, stable upon completion of transfer.  Goal status: ACHIEVED      LONG TERM GOALS: Target date: 04/03/2023   Pt will increase FOTO to at least 57 to demonstrate significant improvement in function at home related to balance  Baseline: 01/22/23: 48.   02/28/23: 73/57 Goal status: ACHIEVED    2.  Pt will improve BERG by at least 3 points in order to demonstrate clinically significant improvement in balance.   Baseline: 01/22/23: Baseline to be performed on visit # 2.   01/24/23: 47/56.   02/28/23: 49/56     03/27/23: 52/56 Goal status: ACHIEVED   3. Pt will perform 5TSTS in less than 12 seconds to demonstrate clinically significant improvement in LE strength      Baseline:  01/22/23: Baseline to be performed as sit to stand improves.   02/28/23: 23.5 sec     03/27/23: 21.3 sec    04/15/23: 15.9 sec  Goal status: IN PROGRESS    5. Pt will improve DGI by at least 3 points in order to demonstrate clinically significant improvement in balance and decreased risk for falls.     Baseline: 01/22/23: Baseline to be performed on visit # 2.   01/24/23: 18/24.   02/28/23: 20/24    03/27/23: 22/24 Goal status: ACHIEVED      PLAN: PT FREQUENCY: 2x/week   PT DURATION: 1-2 weeks   PLANNED INTERVENTIONS: Therapeutic exercises, Therapeutic activity, Neuromuscular re-education, Balance training, Gait training, Patient/Family education, Electrical stimulation, Cryotherapy, Moist heat, and Manual therapy   PLAN FOR NEXT SESSION: Continue with LE strengthening, task practice for transferring; improving LE power to prevent fall and reactive balance  strategies.      Consuela Mimes, PT, DPT [phone]  Physical Therapist- [facility]  2:14 PM, 04/17/23

## 2023-04-22 ENCOUNTER — Ambulatory Visit: Payer: PPO | Admitting: Physical Therapy

## 2023-04-24 ENCOUNTER — Ambulatory Visit: Payer: PPO | Admitting: Physical Therapy

## 2023-04-24 DIAGNOSIS — R262 Difficulty in walking, not elsewhere classified: Secondary | ICD-10-CM

## 2023-04-24 DIAGNOSIS — R2689 Other abnormalities of gait and mobility: Secondary | ICD-10-CM | POA: Diagnosis not present

## 2023-04-24 DIAGNOSIS — M6281 Muscle weakness (generalized): Secondary | ICD-10-CM

## 2023-04-24 NOTE — Therapy (Signed)
OUTPATIENT PHYSICAL THERAPY TREATMENT    Patient Name: Juan [name] MRN: [id number] DOB:14-Oct-1940, 82 y.o., male Today's Date: 04/24/2023   END OF SESSION:   PT End of Session - 04/24/23 1251     Visit Number 22    Number of Visits 22    Date for PT Re-Evaluation 04/25/23    Authorization Type HTA 2024 - visit limit based on medical necessity    Authorization Time Period IE 01/22/23    PT Start Time 1247    PT Stop Time 1327    PT Time Calculation (min) 40 min    Equipment Utilized During Treatment Gait belt    Activity Tolerance Patient tolerated treatment well;No increased pain    Behavior During Therapy WFL for tasks assessed/performed                Past Medical History:  Diagnosis Date   Arthritis    back- low   Atrial fibrillation (HCC)    Cancer (HCC)    skin ( basal) cell , face, head, back, inside L ear   Coronary artery disease    Dysrhythmia    MI (myocardial infarction) (HCC)    Sleep apnea    +Cpap in use nightly , last study- 10 yrs. ago   Stroke [facility])    "light stroke"   Past Surgical History:  Procedure Laterality Date   APPENDECTOMY     BACK SURGERY  09/2011   lumbar fusion    CARDIAC CATHETERIZATION     CARDIAC SURGERY     CARDIAC VALVE REPLACEMENT     CORONARY ARTERY BYPASS GRAFT     EYE SURGERY Bilateral    catarascts removed-    HERNIA REPAIR Bilateral 1946, 1990's   inguinal x3 procedures    LUMBAR LAMINECTOMY/DECOMPRESSION MICRODISCECTOMY Left 08/14/2019   Procedure: Laminectomy for facet/synovial cyst - left - Lumbar three-Lumbar four;  Surgeon: Julio Sicks, MD;  Location: [facility] OR;  Service: Neurosurgery;  Laterality: Left;   TONSILLECTOMY     Patient Active Problem List   Diagnosis Date Noted   Synovial cyst of lumbar facet joint 08/14/2019    PCP: Marguarite Arbour, MD REFERRING PROVIDER: Lonell Face, MD  REFERRING DIAG:  R53.83 (ICD-10-CM) - Other fatigue    THERAPY DIAG:  Imbalance  Difficulty in  walking, not elsewhere classified  Muscle weakness (generalized)  Rationale for Evaluation and Treatment Rehabilitation  PERTINENT HISTORY: Patient is an 82 year old male referred for balance and foot drop. 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 Patient's wife reports that patient had small stroke last summer. He has more difficulty with memory. She states he walks more slowly and his wife is concerned he might fall. They report trouble with getting up and down from chair. He reports more "wobbling" and being more unsteady with gait. His wife reports that his "heart is weak" - pt has Hx of CAD, A-fib, and MI. No unexplained weight loss.      Patient Goals: More steady, pt wants to get stronger       PRECAUTIONS: Imbalance/foot drop    SUBJECTIVE:  SUBJECTIVE STATEMENT:  Patient reports feeling generally well today. No new concerns or safety incidents since last follow-up visit.    PAIN:  Are you having pain? No     OBJECTIVE: (objective measures completed at initial evaluation unless otherwise dated)     TODAY'S TREATMENT 04/24/23    Therapeutic Exercise - improved strength as needed to improve performance of CKC activities/functional movements and as needed for power production to prevent fall during episode of large postural perturbation    -NuStep cycling, Level 6 x 5 minutes for improved soft tissue mobility and increased tissue temperature to improve muscle performance  -subjective gathered during portion of time on bike  -2 minutes not billed  Sit to stand with 6-lb medball Goblet hold; standard chair without Airex; 1x10, 1x8   Forward mini-lunge, alternating R/L in // bars; x15 alternating side to side    PATIENT/FAMILY EDUCATION: Discussed current  role of PT and expectations with visits moving forward    *not today* Forward step up to contralateral toe tap; 2x10 with each LE     Neuromuscular Re-education - for improved sensory integration, static and dynamic postural control, equilibrium and non-equilibrium coordination as needed for negotiating home and community environment and stepping over obstacles   -Standing wall bounce with pt standing on Airex; 1x30, 1x20 bounces  -Obstacle course (in hallway):  6" step up and down --> airex pad step up and over x 2: --> long Airex step-over with pad oriented horizontally   x5 laps D/B, CGA  -Split stance perturbations; x 1 minute with RLE in back and LLE in back   -4 square stepping; 5x CCW/CW with PT CGA  -3/4 Tandem stance; 2 x 30 sec each position    *not today* -Full Tandem stance attempted, unable to maintain -Gait with ball toss in hallway; x 3 D/B length of hall (70-ft hall) -Multi-directional step on blue star; 5 directions (lateral R/L, anterolateral R/L, anterior); x 5 ea dir On blue agility ladder: High knees with opposite knee tap, no weight; 3x D/B High knees with opposite knee tap, with 5-lb ankle weights; 3x D/B // bars: -Tandem walk along blue line; 2x D/B with difficulty maintaining without reliance on bars -Semitandem (3/4 Tandem) standing; 2 x 30 sec, bilat -Standing FT on Airex with perturbations; 2 x 1-minute bouts  -increased intensity of perturbations on second bout  -Standing FT on Airex x 30 sec  -minimal difficulty -Side stepping over 3 6-inch hurdles; 5x D/B length of bars  Lateral step up/down over Airex pad, alternating R/L over pad; 2x10 alternating R<>L -Anterior alternate toe taps, hands free, 2x12, 12" step with 2 # AW's.   -minimal UE support needed today  -Side stepping with Green Tband around ankles: 6x D/B length of // bars; Mod multimodal cuing for staying parallel to walls     PATIENT EDUCATION:  Education details: HEP, demo as  well as verbal and tactile curing for exercise technique Person educated: Patient and Spouse Education method: Explanation, Demonstration, and Handouts Education comprehension: verbalized understanding     HOME EXERCISE PROGRAM: Access Code: [email or web address] URL: [email or web address] Date: 03/18/2023 Prepared by: Consuela Mimes  Exercises - Sit to Stand with Weight  - 2 x daily - 7 x weekly - 2-3 sets - 5-6 reps - Heel Toe Raises with Counter Support  - 2 x daily - 7 x weekly - 2 sets - 10 reps - Standing Hip Abduction with Counter Support  - 2 x daily - 7 x weekly -  2 sets - 10 reps - Standing Anterior Toe Taps  - 2 x daily - 7 x weekly - 2 sets - 10 reps - Standing Romberg to 1/2 Tandem Stance  - 2 x daily - 7 x weekly - 2-3 sets - 30sec hold     ASSESSMENT:   CLINICAL IMPRESSION: Pt exhibits markedly improved sit to stand and demonstrates safe negotiation of obstacles and step-up/step-down. He will likely be ready for D/C with one more visit scheduled next Wednesday. Pt will likely then be appropriate for transition to HEP. Patient will benefit from skilled PT to address his noted impairments and improve overall function.    REHAB POTENTIAL: Good   CLINICAL DECISION MAKING: Evolving/moderate complexity   EVALUATION COMPLEXITY: Moderate     GOALS: Goals reviewed with patient? No   SHORT TERM GOALS: Target date: 02/13/2023   Pt will be independent with HEP in order to improve strength and balance in order to decrease fall risk and improve function at home. Baseline: 01/22/23: Baseline HEP initiated.   02/28/23: Compliant with HEP     03/27/23: Inconsistent HEP compliance.   04/15/23: Pt reports limited compliance with HEP.  Goal status: NOT MET    Pt will perform independent sit to stand without UE support and symmetrical weightbearing indicative of improved functional LE strength and ability to perform transferring Baseline: 01/22/23: Multiple attempts to perform sit to  stand without push on armrests, unable to perform until patient pushed onto anterior thighs.   02/28/23: Sit to stand independent with no UE support, stable upon completion of transfer.  Goal status: ACHIEVED      LONG TERM GOALS: Target date: 04/03/2023   Pt will increase FOTO to at least 57 to demonstrate significant improvement in function at home related to balance  Baseline: 01/22/23: 48.   02/28/23: 73/57 Goal status: ACHIEVED    2.  Pt will improve BERG by at least 3 points in order to demonstrate clinically significant improvement in balance.   Baseline: 01/22/23: Baseline to be performed on visit # 2.   01/24/23: 47/56.   02/28/23: 49/56     03/27/23: 52/56 Goal status: ACHIEVED   3. Pt will perform 5TSTS in less than 12 seconds to demonstrate clinically significant improvement in LE strength      Baseline:  01/22/23: Baseline to be performed as sit to stand improves.   02/28/23: 23.5 sec     03/27/23: 21.3 sec    04/15/23: 15.9 sec  Goal status: IN PROGRESS    5. Pt will improve DGI by at least 3 points in order to demonstrate clinically significant improvement in balance and decreased risk for falls.     Baseline: 01/22/23: Baseline to be performed on visit # 2.   01/24/23: 18/24.   02/28/23: 20/24    03/27/23: 22/24 Goal status: ACHIEVED      PLAN: PT FREQUENCY: 2x/week   PT DURATION: 1-2 weeks   PLANNED INTERVENTIONS: Therapeutic exercises, Therapeutic activity, Neuromuscular re-education, Balance training, Gait training, Patient/Family education, Electrical stimulation, Cryotherapy, Moist heat, and Manual therapy   PLAN FOR NEXT SESSION: Continue with LE strengthening, task practice for transferring; improving LE power to prevent fall and reactive balance strategies.      Consuela Mimes, PT, DPT [phone]  Physical Therapist- [facility]  1:27 PM, 04/24/23

## 2023-04-30 DIAGNOSIS — G3184 Mild cognitive impairment, so stated: Secondary | ICD-10-CM | POA: Diagnosis not present

## 2023-04-30 DIAGNOSIS — M21372 Foot drop, left foot: Secondary | ICD-10-CM | POA: Diagnosis not present

## 2023-04-30 DIAGNOSIS — E441 Mild protein-calorie malnutrition: Secondary | ICD-10-CM | POA: Diagnosis not present

## 2023-04-30 DIAGNOSIS — R5383 Other fatigue: Secondary | ICD-10-CM | POA: Diagnosis not present

## 2023-04-30 DIAGNOSIS — Z8673 Personal history of transient ischemic attack (TIA), and cerebral infarction without residual deficits: Secondary | ICD-10-CM | POA: Diagnosis not present

## 2023-04-30 DIAGNOSIS — G250 Essential tremor: Secondary | ICD-10-CM | POA: Diagnosis not present

## 2023-05-01 ENCOUNTER — Ambulatory Visit: Payer: PPO | Attending: Neurology | Admitting: Physical Therapy

## 2023-05-01 DIAGNOSIS — R2689 Other abnormalities of gait and mobility: Secondary | ICD-10-CM | POA: Diagnosis not present

## 2023-05-01 DIAGNOSIS — R262 Difficulty in walking, not elsewhere classified: Secondary | ICD-10-CM | POA: Diagnosis not present

## 2023-05-01 DIAGNOSIS — M6281 Muscle weakness (generalized): Secondary | ICD-10-CM | POA: Diagnosis not present

## 2023-05-01 NOTE — Therapy (Signed)
OUTPATIENT PHYSICAL THERAPY TREATMENT    Patient Name: Juan [name] MRN: [id number] DOB:1941-05-20, 82 y.o., male Today's Date: 05/01/2023   END OF SESSION:   PT End of Session - 05/01/23 0943     Visit Number 23    Number of Visits 24    Date for PT Re-Evaluation --    Authorization Type HTA 2024 - visit limit based on medical necessity    Authorization Time Period IE 01/22/23    PT Start Time 0943    PT Stop Time 1025    PT Time Calculation (min) 42 min    Equipment Utilized During Treatment Gait belt    Activity Tolerance Patient tolerated treatment well;No increased pain    Behavior During Therapy WFL for tasks assessed/performed                 Past Medical History:  Diagnosis Date   Arthritis    back- low   Atrial fibrillation (HCC)    Cancer (HCC)    skin ( basal) cell , face, head, back, inside L ear   Coronary artery disease    Dysrhythmia    MI (myocardial infarction) (HCC)    Sleep apnea    +Cpap in use nightly , last study- 10 yrs. ago   Stroke [facility])    "light stroke"   Past Surgical History:  Procedure Laterality Date   APPENDECTOMY     BACK SURGERY  09/2011   lumbar fusion    CARDIAC CATHETERIZATION     CARDIAC SURGERY     CARDIAC VALVE REPLACEMENT     CORONARY ARTERY BYPASS GRAFT     EYE SURGERY Bilateral    catarascts removed-    HERNIA REPAIR Bilateral 1946, 1990's   inguinal x3 procedures    LUMBAR LAMINECTOMY/DECOMPRESSION MICRODISCECTOMY Left 08/14/2019   Procedure: Laminectomy for facet/synovial cyst - left - Lumbar three-Lumbar four;  Surgeon: Julio Sicks, MD;  Location: [facility] OR;  Service: Neurosurgery;  Laterality: Left;   TONSILLECTOMY     Patient Active Problem List   Diagnosis Date Noted   Synovial cyst of lumbar facet joint 08/14/2019    PCP: Marguarite Arbour, MD REFERRING PROVIDER: Lonell Face, MD  REFERRING DIAG:  R53.83 (ICD-10-CM) - Other fatigue    THERAPY DIAG:  Imbalance  Difficulty in walking,  not elsewhere classified  Muscle weakness (generalized)  Rationale for Evaluation and Treatment Rehabilitation  PERTINENT HISTORY: Patient is an 82 year old male referred for balance and foot drop. 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 Patient's wife reports that patient had small stroke last summer. He has more difficulty with memory. She states he walks more slowly and his wife is concerned he might fall. They report trouble with getting up and down from chair. He reports more "wobbling" and being more unsteady with gait. His wife reports that his "heart is weak" - pt has Hx of CAD, A-fib, and MI. No unexplained weight loss.      Patient Goals: More steady, pt wants to get stronger       PRECAUTIONS: Imbalance/foot drop    SUBJECTIVE:  SUBJECTIVE STATEMENT:  Patient reports feeling some fatigue and headache at arrival today. He reports no dizziness or no other pain at arrival to PT. Patient reports he is following up with MD this week. He reports no major LOB or safety incidents recently.    PAIN:  Are you having pain? No     OBJECTIVE: (objective measures completed at initial evaluation unless otherwise dated)     TODAY'S TREATMENT 05/01/23    Therapeutic Exercise - improved strength as needed to improve performance of CKC activities/functional movements and as needed for power production to prevent fall during episode of large postural perturbation    -NuStep cycling, Level 6 x 5 minutes for improved soft tissue mobility and increased tissue temperature to improve muscle performance  -subjective gathered during portion of time on bike -2 minutes not billed   5 times sit to stand  Trial 1: 18.8 sec  Trial 2: 17.5 sec  Trial 3: Unable to complete 5  consecutively    Forward mini-lunge, alternating R/L in // bars; x15 alternating side to side    PATIENT/FAMILY EDUCATION: Discussed current role of PT and expectations with visits moving forward    *not today* Forward step up to contralateral toe tap; 2x10 with each LE     Neuromuscular Re-education - for improved sensory integration, static and dynamic postural control, equilibrium and non-equilibrium coordination as needed for negotiating home and community environment and stepping over obstacles   -Standing wall bounce with pt standing on Airex; 2x20 boucnes   -Split stance perturbations; x 1 minute with RLE in back and LLE in back    -Attempted full Tandem stance;unable to maintain -3/4 Tandem stance; 2 x 30 sec each position   -Obstacle course (in hallway):  6" step up and down --> airex pad step up and over x 2: --> long Airex step-over with pad oriented horizontally   x5 laps D/B, CGA   *not today* -4 square stepping; 5x CCW/CW with PT CGA -Full Tandem stance attempted, unable to maintain -Gait with ball toss in hallway; x 3 D/B length of hall (70-ft hall) -Multi-directional step on blue star; 5 directions (lateral R/L, anterolateral R/L, anterior); x 5 ea dir On blue agility ladder: High knees with opposite knee tap, no weight; 3x D/B High knees with opposite knee tap, with 5-lb ankle weights; 3x D/B // bars: -Tandem walk along blue line; 2x D/B with difficulty maintaining without reliance on bars -Semitandem (3/4 Tandem) standing; 2 x 30 sec, bilat -Standing FT on Airex with perturbations; 2 x 1-minute bouts  -increased intensity of perturbations on second bout  -Standing FT on Airex x 30 sec  -minimal difficulty -Side stepping over 3 6-inch hurdles; 5x D/B length of bars  Lateral step up/down over Airex pad, alternating R/L over pad; 2x10 alternating R<>L -Anterior alternate toe taps, hands free, 2x12, 12" step with 2 # AW's.   -minimal UE support needed  today  -Side stepping with Green Tband around ankles: 6x D/B length of // bars; Mod multimodal cuing for staying parallel to walls     PATIENT EDUCATION:  Education details: HEP, demo as well as verbal and tactile curing for exercise technique Person educated: Patient and Spouse Education method: Explanation, Demonstration, and Handouts Education comprehension: verbalized understanding     HOME EXERCISE PROGRAM: Access Code: [email or web address] URL: [email or web address] Date: 03/18/2023 Prepared by: Consuela Mimes  Exercises - Sit to Stand with Weight  - 2 x daily - 7 x weekly - 2-3 sets -  5-6 reps - Heel Toe Raises with Counter Support  - 2 x daily - 7 x weekly - 2 sets - 10 reps - Standing Hip Abduction with Counter Support  - 2 x daily - 7 x weekly - 2 sets - 10 reps - Standing Anterior Toe Taps  - 2 x daily - 7 x weekly - 2 sets - 10 reps - Standing Romberg to 1/2 Tandem Stance  - 2 x daily - 7 x weekly - 2-3 sets - 30sec hold     ASSESSMENT:   CLINICAL IMPRESSION: Pt has one more visit for next week and he will likely be able to work toward independent HEP at that time. Pt has met or surpassed cut-off for all fall risk screens completed and he has substantially improved with ability to perform sit to stands and 5TSTS test. Patient will benefit from skilled PT to address his noted impairments and improve overall function.    REHAB POTENTIAL: Good   CLINICAL DECISION MAKING: Evolving/moderate complexity   EVALUATION COMPLEXITY: Moderate     GOALS: Goals reviewed with patient? No   SHORT TERM GOALS: Target date: 02/13/2023   Pt will be independent with HEP in order to improve strength and balance in order to decrease fall risk and improve function at home. Baseline: 01/22/23: Baseline HEP initiated.   02/28/23: Compliant with HEP     03/27/23: Inconsistent HEP compliance.   04/15/23: Pt reports limited compliance with HEP.  Goal status: NOT MET    Pt will perform  independent sit to stand without UE support and symmetrical weightbearing indicative of improved functional LE strength and ability to perform transferring Baseline: 01/22/23: Multiple attempts to perform sit to stand without push on armrests, unable to perform until patient pushed onto anterior thighs.   02/28/23: Sit to stand independent with no UE support, stable upon completion of transfer.  Goal status: ACHIEVED      LONG TERM GOALS: Target date: 04/03/2023   Pt will increase FOTO to at least 57 to demonstrate significant improvement in function at home related to balance  Baseline: 01/22/23: 48.   02/28/23: 73/57 Goal status: ACHIEVED    2.  Pt will improve BERG by at least 3 points in order to demonstrate clinically significant improvement in balance.   Baseline: 01/22/23: Baseline to be performed on visit # 2.   01/24/23: 47/56.   02/28/23: 49/56     03/27/23: 52/56 Goal status: ACHIEVED   3. Pt will perform 5TSTS in less than 12 seconds to demonstrate clinically significant improvement in LE strength      Baseline:  01/22/23: Baseline to be performed as sit to stand improves.   02/28/23: 23.5 sec     03/27/23: 21.3 sec    04/15/23: 15.9 sec  Goal status: IN PROGRESS    5. Pt will improve DGI by at least 3 points in order to demonstrate clinically significant improvement in balance and decreased risk for falls.     Baseline: 01/22/23: Baseline to be performed on visit # 2.   01/24/23: 18/24.   02/28/23: 20/24    03/27/23: 22/24 Goal status: ACHIEVED      PLAN: PT FREQUENCY: 2x/week   PT DURATION: 1-2 weeks   PLANNED INTERVENTIONS: Therapeutic exercises, Therapeutic activity, Neuromuscular re-education, Balance training, Gait training, Patient/Family education, Electrical stimulation, Cryotherapy, Moist heat, and Manual therapy   PLAN FOR NEXT SESSION: Continue with LE strengthening, task practice for transferring; improving LE power to prevent fall and reactive balance  strategies.  Re-assess and discuss  D/C next visit.    Consuela Mimes, PT, DPT [phone]  Physical Therapist- [facility]  9:43 AM, 05/01/23

## 2023-05-04 ENCOUNTER — Emergency Department: Payer: PPO

## 2023-05-04 ENCOUNTER — Other Ambulatory Visit: Payer: Self-pay

## 2023-05-04 ENCOUNTER — Emergency Department
Admission: EM | Admit: 2023-05-04 | Discharge: 2023-05-04 | Disposition: A | Discharge status: Home / Self Care | Payer: PPO | Source: Home / Self Care | Attending: Emergency Medicine | Admitting: Emergency Medicine

## 2023-05-04 DIAGNOSIS — R519 Headache, unspecified: Secondary | ICD-10-CM | POA: Diagnosis not present

## 2023-05-04 DIAGNOSIS — I2510 Atherosclerotic heart disease of native coronary artery without angina pectoris: Secondary | ICD-10-CM | POA: Insufficient documentation

## 2023-05-04 DIAGNOSIS — R531 Weakness: Secondary | ICD-10-CM | POA: Diagnosis not present

## 2023-05-04 DIAGNOSIS — I4891 Unspecified atrial fibrillation: Secondary | ICD-10-CM | POA: Insufficient documentation

## 2023-05-04 DIAGNOSIS — H539 Unspecified visual disturbance: Secondary | ICD-10-CM

## 2023-05-04 DIAGNOSIS — I6782 Cerebral ischemia: Secondary | ICD-10-CM | POA: Diagnosis not present

## 2023-05-04 DIAGNOSIS — Z7901 Long term (current) use of anticoagulants: Secondary | ICD-10-CM | POA: Insufficient documentation

## 2023-05-04 DIAGNOSIS — H538 Other visual disturbances: Secondary | ICD-10-CM | POA: Insufficient documentation

## 2023-05-04 LAB — TROPONIN I (HIGH SENSITIVITY): Troponin I (High Sensitivity): 22 ng/L — ABNORMAL HIGH (ref <18)

## 2023-05-04 LAB — COMPREHENSIVE METABOLIC PANEL
ALT: 17 U/L (ref 0–44)
AST: 40 U/L (ref 15–41)
Albumin: 3.9 g/dL (ref 3.5–5.0)
Alkaline Phosphatase: 39 U/L (ref 38–126)
Anion gap: 9 (ref 5–15)
BUN: 22 mg/dL (ref 8–23)
CO2: 28 mmol/L (ref 22–32)
Calcium: 9.2 mg/dL (ref 8.9–10.3)
Chloride: 100 mmol/L (ref 98–111)
Creatinine, Ser: 1.19 mg/dL (ref 0.61–1.24)
GFR, Estimated: >60 mL/min (ref >60)
Glucose, Bld: 113 mg/dL — ABNORMAL HIGH (ref 70–99)
Potassium: 4 mmol/L (ref 3.5–5.1)
Sodium: 137 mmol/L (ref 135–145)
Total Bilirubin: 1.1 mg/dL (ref 0.3–1.2)
Total Protein: 6.3 g/dL — ABNORMAL LOW (ref 6.5–8.1)

## 2023-05-04 LAB — CBC WITH DIFFERENTIAL/PLATELET
Abs Immature Granulocytes: 0.01 10*3/uL (ref 0.00–0.07)
Basophils Absolute: 0.1 10*3/uL (ref 0.0–0.1)
Basophils Relative: 1 %
Eosinophils Absolute: 0.2 10*3/uL (ref 0.0–0.5)
Eosinophils Relative: 3 %
HCT: 39 % (ref 39.0–52.0)
Hemoglobin: 12.7 g/dL — ABNORMAL LOW (ref 13.0–17.0)
Immature Granulocytes: 0 %
Lymphocytes Relative: 7 %
Lymphs Abs: 0.5 10*3/uL — ABNORMAL LOW (ref 0.7–4.0)
MCH: 33 pg (ref 26.0–34.0)
MCHC: 32.6 g/dL (ref 30.0–36.0)
MCV: 101.3 fL — ABNORMAL HIGH (ref 80.0–100.0)
Monocytes Absolute: 0.4 10*3/uL (ref 0.1–1.0)
Monocytes Relative: 6 %
Neutro Abs: 5.7 10*3/uL (ref 1.7–7.7)
Neutrophils Relative %: 83 %
Platelets: 140 10*3/uL — ABNORMAL LOW (ref 150–400)
RBC: 3.85 MIL/uL — ABNORMAL LOW (ref 4.22–5.81)
RDW: 12.8 % (ref 11.5–15.5)
WBC: 6.8 10*3/uL (ref 4.0–10.5)
nRBC: 0 % (ref 0.0–0.2)

## 2023-05-04 LAB — MAGNESIUM: Magnesium: 2.2 mg/dL (ref 1.7–2.4)

## 2023-05-04 MED ORDER — SODIUM CHLORIDE 0.9 % IV BOLUS
500.0000 mL | Freq: Once | INTRAVENOUS | Status: AC
  Administered 2023-05-04: 500 mL via INTRAVENOUS

## 2023-05-04 NOTE — ED Provider Notes (Signed)
[facility] Provider Note   Event Date/Time   First MD Initiated Contact with Patient 05/04/23 1136     (approximate) History  Weakness and Headache  HPI Juan [name] is a 82 y.o. male with stated past medical history of CAD, atrial fibrillation on Eliquis, and previous CVA who presents complaining of generalized weakness, headache, left eye vision changes, and left upper extremity weakness that has been intermittent over the last week associated with a generalized headache.  Patient states that the vision changes and headache 1 week ago have resolved and lasted approximately 30 minutes.  Patient states that his left arm also has intermittent weakness over this last week as well.  Patient states that he has now just having a headache that began this morning and he has resolved prior to my evaluation.  Patient states that over this last week he has also just felt generalized weakness. ROS: Patient currently denies any vision changes, tinnitus, difficulty speaking, facial droop, sore throat, chest pain, shortness of breath, abdominal pain, nausea/vomiting/diarrhea, dysuria, or numbness/paresthesias in any extremity   Physical Exam  Triage Vital Signs: ED Triage Vitals  Encounter Vitals Group     BP 05/04/23 1134 123/77     Systolic BP Percentile --      Diastolic BP Percentile --      Pulse Rate 05/04/23 1134 (!) 58     Resp 05/04/23 1134 16     Temp 05/04/23 1134 98.1 F (36.7 C)     Temp Source 05/04/23 1134 Oral     SpO2 05/04/23 1134 100 %     Weight 05/04/23 1126 149 lb 14.6 oz (68 kg)     Height 05/04/23 1126 5\' 9"  (1.753 m)     Head Circumference --      Peak Flow --      Pain Score 05/04/23 1126 8     Pain Loc --      Pain Education --      Exclude from Growth Chart --    Most recent vital signs: Vitals:   05/04/23 1134  BP: 123/77  Pulse: (!) 58  Resp: 16  Temp: 98.1 F (36.7 C)  SpO2: 100%   General: Awake, oriented x4. CV:  Good  peripheral perfusion.  Resp:  Normal effort.  Abd:  No distention.  Other:  Well-developed, well-nourished Caucasian elderly male laying in bed in no acute distress.  NIHSS is 0 ED Results / Procedures / Treatments  Labs (all labs ordered are listed, but only abnormal results are displayed) Labs Reviewed  COMPREHENSIVE METABOLIC PANEL - Abnormal; Notable for the following components:      Result Value   Glucose, Bld 113 (*)    Total Protein 6.3 (*)    All other components within normal limits  CBC WITH DIFFERENTIAL/PLATELET - Abnormal; Notable for the following components:   RBC 3.85 (*)    Hemoglobin 12.7 (*)    MCV 101.3 (*)    Platelets 140 (*)    Lymphs Abs 0.5 (*)    All other components within normal limits  TROPONIN I (HIGH SENSITIVITY) - Abnormal; Notable for the following components:   Troponin I (High Sensitivity) 22 (*)    All other components within normal limits  MAGNESIUM   EKG ED ECG REPORT I, Merwyn Katos, the attending physician, personally viewed and interpreted this ECG. Date: 05/04/2023 EKG Time: 1133 Rate: 86 Rhythm: Atrial fibrillation with PVCs QRS Axis: normal Intervals: normal ST/T Wave  abnormalities: normal Narrative Interpretation: Atrial fibrillation with PVCs.  No evidence of acute ischemia RADIOLOGY ED MD interpretation: CT of the head without contrast interpreted by me shows no evidence of acute abnormalities including no intracerebral hemorrhage, obvious masses, or significant edema -Agree with radiology assessment Official radiology report(s): CT Head Wo Contrast  Result Date: 05/04/2023 CLINICAL DATA:  82 year old male with suspected stroke. Headache. Visual abnormalities. EXAM: CT HEAD WITHOUT CONTRAST TECHNIQUE: Contiguous axial images were obtained from the base of the skull through the vertex without intravenous contrast. RADIATION DOSE REDUCTION: This exam was performed according to the departmental dose-optimization program which  includes automated exposure control, adjustment of the mA and/or kV according to patient size and/or use of iterative reconstruction technique. COMPARISON:  MRI of the brain 05/22/2022. FINDINGS: Brain: Mild cerebral atrophy. Patchy and confluent areas of decreased attenuation are noted throughout the deep and periventricular white matter of the cerebral hemispheres bilaterally, compatible with chronic microvascular ischemic disease. Well-defined areas of low attenuation are noted in the posterior limb of the left internal capsule, inferior aspect of the left putamen, and in the left cerebellar hemisphere, compatible with old infarcts. No evidence of acute infarction, hemorrhage, hydrocephalus, extra-axial collection or mass lesion/mass effect. Vascular: No hyperdense vessel or unexpected calcification. Skull: Normal. Negative for fracture or focal lesion. Sinuses/Orbits: No acute finding. Other: None. IMPRESSION: 1. No acute intracranial abnormalities. 2. Mild cerebral atrophy with extensive chronic microvascular ischemic changes, old infarct in the left cerebellar hemisphere, and old lacunar infarcts in the left basal ganglia and posterior limb of the left internal capsule. Electronically Signed   By: Trudie Reed M.D.   On: 05/04/2023 12:43   PROCEDURES: Critical Care performed: No .1-3 Lead EKG Interpretation  Performed by: Merwyn Katos, MD Authorized by: Merwyn Katos, MD     Interpretation: normal     ECG rate:  71   ECG rate assessment: normal     Rhythm: sinus rhythm     Ectopy: none     Conduction: normal    MEDICATIONS ORDERED IN ED: Medications  sodium chloride 0.9 % bolus 500 mL (has no administration in time range)   IMPRESSION / MDM / ASSESSMENT AND PLAN / ED COURSE  I reviewed the triage vital signs and the nursing notes.                             The patient is on the cardiac monitor to evaluate for evidence of arrhythmia and/or significant heart rate  changes. Patient's presentation is most consistent with acute presentation with potential threat to life or bodily function. Presents with sensation of intermittent vision changes and intermittent left-sided weakness.  Current NIH stroke scale 0  Patients symptoms and work-up not consistent with a stroke and therefore they will be discharged from the ED.  Patient has currently been stabilized in the emergency department.  Patient received an head CT that was negative for stroke.  Patients symptoms not typical for other emergent causes such as dissection, infection, DKA, trauma. Patient will be discharged with strict return precautions and follow up with primary MD within 24 hours for further evaluation.   FINAL CLINICAL IMPRESSION(S) / ED DIAGNOSES   Final diagnoses:  Acute nonintractable headache, unspecified headache type  Changes in vision   Rx / DC Orders   ED Discharge Orders     None      Note:  This document was prepared using Dragon  voice recognition software and may include unintentional dictation errors.   Merwyn Katos, MD 05/04/23 [phone]

## 2023-05-04 NOTE — ED Triage Notes (Signed)
Pt reports has felt weak over the last few days and this am started with a HA and closed in vision. Pt reports vision has cleared but still has a headache. Denies CP, SOB or other sx's.

## 2023-05-04 NOTE — ED Notes (Signed)
Pt A&O x4, no obvious distress noted, respirations regular/unlabored. Pt verbalizes understanding of discharge instructions. Pt able to ambulate from ED independently.   

## 2023-05-04 NOTE — Discharge Instructions (Addendum)
Please use ibuprofen (Motrin) up to 800 mg every 8 hours, naproxen (Naprosyn) up to 500 mg every 12 hours, and/or acetaminophen (Tylenol) up to 4 g/day for any continued pain.  Please do not use this medication regimen for longer than 7 days

## 2023-05-06 ENCOUNTER — Ambulatory Visit: Payer: PPO | Admitting: Physical Therapy

## 2023-05-06 NOTE — Therapy (Deleted)
OUTPATIENT PHYSICAL THERAPY TREATMENT    Patient Name: Juan [name] MRN: [id number] DOB:1940/11/02, 82 y.o., male Today's Date: 05/06/2023   END OF SESSION:         Past Medical History:  Diagnosis Date   Arthritis    back- low   Atrial fibrillation (HCC)    Cancer (HCC)    skin ( basal) cell , face, head, back, inside L ear   Coronary artery disease    Dysrhythmia    MI (myocardial infarction) (HCC)    Sleep apnea    +Cpap in use nightly , last study- 10 yrs. ago   Stroke [facility])    "light stroke"   Past Surgical History:  Procedure Laterality Date   APPENDECTOMY     BACK SURGERY  09/2011   lumbar fusion    CARDIAC CATHETERIZATION     CARDIAC SURGERY     CARDIAC VALVE REPLACEMENT     CORONARY ARTERY BYPASS GRAFT     EYE SURGERY Bilateral    catarascts removed-    HERNIA REPAIR Bilateral 1946, 1990's   inguinal x3 procedures    LUMBAR LAMINECTOMY/DECOMPRESSION MICRODISCECTOMY Left 08/14/2019   Procedure: Laminectomy for facet/synovial cyst - left - Lumbar three-Lumbar four;  Surgeon: Julio Sicks, MD;  Location: [facility] OR;  Service: Neurosurgery;  Laterality: Left;   TONSILLECTOMY     Patient Active Problem List   Diagnosis Date Noted   Synovial cyst of lumbar facet joint 08/14/2019    PCP: Marguarite Arbour, MD REFERRING PROVIDER: Lonell Face, MD  REFERRING DIAG:  R53.83 (ICD-10-CM) - Other fatigue    THERAPY DIAG:  Imbalance  Difficulty in walking, not elsewhere classified  Muscle weakness (generalized)  Rationale for Evaluation and Treatment Rehabilitation  PERTINENT HISTORY: Patient is an 82 year old male referred for balance and foot drop. 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 Patient's wife reports that patient had small stroke last summer. He has more difficulty with memory. She states he walks more slowly and his wife is concerned he might fall. They report trouble with getting up and down from chair. He  reports more "wobbling" and being more unsteady with gait. His wife reports that his "heart is weak" - pt has Hx of CAD, A-fib, and MI. No unexplained weight loss.      Patient Goals: More steady, pt wants to get stronger       PRECAUTIONS: Imbalance/foot drop    SUBJECTIVE:  SUBJECTIVE STATEMENT:  Patient reports feeling some fatigue and headache at arrival today. He reports no dizziness or no other pain at arrival to PT. Patient reports he is following up with MD this week. He reports no major LOB or safety incidents recently.    PAIN:  Are you having pain? No     OBJECTIVE: (objective measures completed at initial evaluation unless otherwise dated)     TODAY'S TREATMENT 05/06/23    Therapeutic Exercise - improved strength as needed to improve performance of CKC activities/functional movements and as needed for power production to prevent fall during episode of large postural perturbation    -NuStep cycling, Level 6 x 5 minutes for improved soft tissue mobility and increased tissue temperature to improve muscle performance  -subjective gathered during portion of time on bike -2 minutes not billed   5 times sit to stand  Trial 1: 18.8 sec  Trial 2: 17.5 sec  Trial 3: Unable to complete 5 consecutively    Forward mini-lunge, alternating R/L in // bars; x15 alternating side to side    PATIENT/FAMILY EDUCATION: Discussed current role of PT and expectations with visits moving forward    *not today* Forward step up to contralateral toe tap; 2x10 with each LE     Neuromuscular Re-education - for improved sensory integration, static and dynamic postural control, equilibrium and non-equilibrium coordination as needed for negotiating home and community environment and stepping over  obstacles   -Standing wall bounce with pt standing on Airex; 2x20 boucnes   -Split stance perturbations; x 1 minute with RLE in back and LLE in back    -Attempted full Tandem stance;unable to maintain -3/4 Tandem stance; 2 x 30 sec each position   -Obstacle course (in hallway):  6" step up and down --> airex pad step up and over x 2: --> long Airex step-over with pad oriented horizontally   x5 laps D/B, CGA   *not today* -4 square stepping; 5x CCW/CW with PT CGA -Full Tandem stance attempted, unable to maintain -Gait with ball toss in hallway; x 3 D/B length of hall (70-ft hall) -Multi-directional step on blue star; 5 directions (lateral R/L, anterolateral R/L, anterior); x 5 ea dir On blue agility ladder: High knees with opposite knee tap, no weight; 3x D/B High knees with opposite knee tap, with 5-lb ankle weights; 3x D/B // bars: -Tandem walk along blue line; 2x D/B with difficulty maintaining without reliance on bars -Semitandem (3/4 Tandem) standing; 2 x 30 sec, bilat -Standing FT on Airex with perturbations; 2 x 1-minute bouts  -increased intensity of perturbations on second bout  -Standing FT on Airex x 30 sec  -minimal difficulty -Side stepping over 3 6-inch hurdles; 5x D/B length of bars  Lateral step up/down over Airex pad, alternating R/L over pad; 2x10 alternating R<>L -Anterior alternate toe taps, hands free, 2x12, 12" step with 2 # AW's.   -minimal UE support needed today  -Side stepping with Green Tband around ankles: 6x D/B length of // bars; Mod multimodal cuing for staying parallel to walls     PATIENT EDUCATION:  Education details: HEP, demo as well as verbal and tactile curing for exercise technique Person educated: Patient and Spouse Education method: Explanation, Demonstration, and Handouts Education comprehension: verbalized understanding     HOME EXERCISE PROGRAM: Access Code: [email or web address] URL: [email or web address] Date:  03/18/2023 Prepared by: Consuela Mimes  Exercises - Sit to Stand with Weight  - 2 x daily - 7 x weekly - 2-3 sets -  5-6 reps - Heel Toe Raises with Counter Support  - 2 x daily - 7 x weekly - 2 sets - 10 reps - Standing Hip Abduction with Counter Support  - 2 x daily - 7 x weekly - 2 sets - 10 reps - Standing Anterior Toe Taps  - 2 x daily - 7 x weekly - 2 sets - 10 reps - Standing Romberg to 1/2 Tandem Stance  - 2 x daily - 7 x weekly - 2-3 sets - 30sec hold     ASSESSMENT:   CLINICAL IMPRESSION: Pt has one more visit for next week and he will likely be able to work toward independent HEP at that time. Pt has met or surpassed cut-off for all fall risk screens completed and he has substantially improved with ability to perform sit to stands and 5TSTS test. Patient will benefit from skilled PT to address his noted impairments and improve overall function.    REHAB POTENTIAL: Good   CLINICAL DECISION MAKING: Evolving/moderate complexity   EVALUATION COMPLEXITY: Moderate     GOALS: Goals reviewed with patient? No   SHORT TERM GOALS: Target date: 02/13/2023   Pt will be independent with HEP in order to improve strength and balance in order to decrease fall risk and improve function at home. Baseline: 01/22/23: Baseline HEP initiated.   02/28/23: Compliant with HEP     03/27/23: Inconsistent HEP compliance.   04/15/23: Pt reports limited compliance with HEP.    05/06/23:  Goal status: NOT MET    Pt will perform independent sit to stand without UE support and symmetrical weightbearing indicative of improved functional LE strength and ability to perform transferring Baseline: 01/22/23: Multiple attempts to perform sit to stand without push on armrests, unable to perform until patient pushed onto anterior thighs.   02/28/23: Sit to stand independent with no UE support, stable upon completion of transfer.  Goal status: ACHIEVED      LONG TERM GOALS: Target date: 04/03/2023   Pt will increase FOTO  to at least 57 to demonstrate significant improvement in function at home related to balance  Baseline: 01/22/23: 48.   02/28/23: 73/57 Goal status: ACHIEVED    2.  Pt will improve BERG by at least 3 points in order to demonstrate clinically significant improvement in balance.   Baseline: 01/22/23: Baseline to be performed on visit # 2.   01/24/23: 47/56.   02/28/23: 49/56     03/27/23: 52/56 Goal status: ACHIEVED   3. Pt will perform 5TSTS in less than 12 seconds to demonstrate clinically significant improvement in LE strength      Baseline:  01/22/23: Baseline to be performed as sit to stand improves.   02/28/23: 23.5 sec     03/27/23: 21.3 sec    04/15/23: 15.9 sec.    05/06/23:  Goal status: IN PROGRESS    5. Pt will improve DGI by at least 3 points in order to demonstrate clinically significant improvement in balance and decreased risk for falls.     Baseline: 01/22/23: Baseline to be performed on visit # 2.   01/24/23: 18/24.   02/28/23: 20/24    03/27/23: 22/24 Goal status: ACHIEVED      PLAN: PT FREQUENCY: 2x/week   PT DURATION: 1-2 weeks   PLANNED INTERVENTIONS: Therapeutic exercises, Therapeutic activity, Neuromuscular re-education, Balance training, Gait training, Patient/Family education, Electrical stimulation, Cryotherapy, Moist heat, and Manual therapy   PLAN FOR NEXT SESSION: Continue with LE strengthening, task practice for transferring; improving  LE power to prevent fall and reactive balance strategies.     Consuela Mimes, PT, DPT [phone]  Physical Therapist- [facility]  10:42 AM, 05/06/23

## 2023-05-07 DIAGNOSIS — Z8673 Personal history of transient ischemic attack (TIA), and cerebral infarction without residual deficits: Secondary | ICD-10-CM | POA: Diagnosis not present

## 2023-05-07 DIAGNOSIS — E7800 Pure hypercholesterolemia, unspecified: Secondary | ICD-10-CM | POA: Diagnosis not present

## 2023-05-07 DIAGNOSIS — I059 Rheumatic mitral valve disease, unspecified: Secondary | ICD-10-CM | POA: Diagnosis not present

## 2023-05-07 DIAGNOSIS — I4891 Unspecified atrial fibrillation: Secondary | ICD-10-CM | POA: Diagnosis not present

## 2023-05-07 DIAGNOSIS — Z87898 Personal history of other specified conditions: Secondary | ICD-10-CM | POA: Diagnosis not present

## 2023-05-07 DIAGNOSIS — I255 Ischemic cardiomyopathy: Secondary | ICD-10-CM | POA: Diagnosis not present

## 2023-05-07 DIAGNOSIS — G4733 Obstructive sleep apnea (adult) (pediatric): Secondary | ICD-10-CM | POA: Diagnosis not present

## 2023-05-07 DIAGNOSIS — Z951 Presence of aortocoronary bypass graft: Secondary | ICD-10-CM | POA: Diagnosis not present

## 2023-05-07 DIAGNOSIS — I5020 Unspecified systolic (congestive) heart failure: Secondary | ICD-10-CM | POA: Diagnosis not present

## 2023-05-07 DIAGNOSIS — Z9889 Other specified postprocedural states: Secondary | ICD-10-CM | POA: Diagnosis not present

## 2023-05-07 DIAGNOSIS — G720 Drug-induced myopathy: Secondary | ICD-10-CM | POA: Diagnosis not present

## 2023-05-07 DIAGNOSIS — I2510 Atherosclerotic heart disease of native coronary artery without angina pectoris: Secondary | ICD-10-CM | POA: Diagnosis not present

## 2023-05-08 DIAGNOSIS — H6123 Impacted cerumen, bilateral: Secondary | ICD-10-CM | POA: Diagnosis not present

## 2023-05-08 DIAGNOSIS — H903 Sensorineural hearing loss, bilateral: Secondary | ICD-10-CM | POA: Diagnosis not present

## 2023-05-09 ENCOUNTER — Ambulatory Visit: Payer: PPO | Admitting: Physical Therapy

## 2023-05-09 ENCOUNTER — Encounter: Payer: Self-pay | Admitting: Physical Therapy

## 2023-05-09 DIAGNOSIS — R2689 Other abnormalities of gait and mobility: Secondary | ICD-10-CM

## 2023-05-09 DIAGNOSIS — R262 Difficulty in walking, not elsewhere classified: Secondary | ICD-10-CM

## 2023-05-09 DIAGNOSIS — M6281 Muscle weakness (generalized): Secondary | ICD-10-CM

## 2023-05-09 NOTE — Therapy (Signed)
OUTPATIENT PHYSICAL THERAPY TREATMENT AND PROGRESS NOTE/DISCHARGE    Dates of reporting period  04/15/23   to   05/09/23   Patient Name: Juan [name] MRN: [id number] DOB:[birth year], 82 y.o., male Today's Date: 05/09/2023   END OF SESSION:   PT End of Session - 05/09/23 1558     Visit Number 24    Number of Visits 24    Authorization Type HTA 2024 - visit limit based on medical necessity    Authorization Time Period IE 01/22/23    PT Start Time 1556    PT Stop Time 1636    PT Time Calculation (min) 40 min    Equipment Utilized During Treatment Gait belt    Activity Tolerance Patient tolerated treatment well;No increased pain    Behavior During Therapy WFL for tasks assessed/performed                  Past Medical History:  Diagnosis Date   Arthritis    back- low   Atrial fibrillation (HCC)    Cancer (HCC)    skin ( basal) cell , face, head, back, inside L ear   Coronary artery disease    Dysrhythmia    MI (myocardial infarction) (HCC)    Sleep apnea    +Cpap in use nightly , last study- 10 yrs. ago   Stroke [facility])    "light stroke"   Past Surgical History:  Procedure Laterality Date   APPENDECTOMY     BACK SURGERY  09/2011   lumbar fusion    CARDIAC CATHETERIZATION     CARDIAC SURGERY     CARDIAC VALVE REPLACEMENT     CORONARY ARTERY BYPASS GRAFT     EYE SURGERY Bilateral    catarascts removed-    HERNIA REPAIR Bilateral 1946, 1990's   inguinal x3 procedures    LUMBAR LAMINECTOMY/DECOMPRESSION MICRODISCECTOMY Left 08/14/2019   Procedure: Laminectomy for facet/synovial cyst - left - Lumbar three-Lumbar four;  Surgeon: Julio Sicks, MD;  Location: [facility] OR;  Service: Neurosurgery;  Laterality: Left;   TONSILLECTOMY     Patient Active Problem List   Diagnosis Date Noted   Synovial cyst of lumbar facet joint 08/14/2019    PCP: Marguarite Arbour, MD REFERRING PROVIDER: Lonell Face, MD  REFERRING DIAG:  R53.83 (ICD-10-CM) - Other fatigue     THERAPY DIAG:  Imbalance  Difficulty in walking, not elsewhere classified  Muscle weakness (generalized)  Rationale for Evaluation and Treatment Rehabilitation  PERTINENT HISTORY: Patient is an 82 year old male referred for balance and foot drop. 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 Patient's wife reports that patient had small stroke last summer. He has more difficulty with memory. She states he walks more slowly and his wife is concerned he might fall. They report trouble with getting up and down from chair. He reports more "wobbling" and being more unsteady with gait. His wife reports that his "heart is weak" - pt has Hx of CAD, A-fib, and MI. No unexplained weight loss.      Patient Goals: More steady, pt wants to get stronger       PRECAUTIONS: Imbalance/foot drop    SUBJECTIVE:  SUBJECTIVE STATEMENT:  Patient reports no significant symptoms at arrival. Pt had ED visit on 05/04/23 to rule out stroke with pt having severe headache and some visual changes at the time. Patient reports good f/u with cardiology on 05/07/23. Patient reports doing well with functional mobility as needed for getting to appointments and to church. He has not experienced recent fall or near-fall incident.   PAIN:  Are you having pain? No     OBJECTIVE: (objective measures completed at initial evaluation unless otherwise dated)     TODAY'S TREATMENT 05/09/23    Therapeutic Exercise - improved strength as needed to improve performance of CKC activities/functional movements and as needed for power production to prevent fall during episode of large postural perturbation    GOAL UPDATE PERFORMED*  -NuStep cycling, Level 5;  x 5 minutes for improved soft tissue mobility and increased  tissue temperature to improve muscle performance  -subjective gathered during portion of time on bike -1 minute not billed   5 times sit to stand  Trial 1: 15 sec  Trial 2: 12.5 sec   Trial 3: 12.8 sec    Forward mini-lunge, alternating R/L in // bars; x15 alternating side to side    PATIENT/FAMILY EDUCATION: Updated and reviewed HEP. We discussed with pt and his wife current progress made and appropriateness for transition to HEP given his current progress.     *not today* Forward step up to contralateral toe tap; 2x10 with each LE     Neuromuscular Re-education - for improved sensory integration, static and dynamic postural control, equilibrium and non-equilibrium coordination as needed for negotiating home and community environment and stepping over obstacles   -Standing wall bounce with pt standing on Airex; 2x20 boucnes  -Split stance perturbations; x 1 minute with RLE in back and LLE in back   -3/4 Tandem stance; 2 x 30 sec each position   On blue agility ladder: Alternating lateral cone tap, 4 cones on R and L; 4x D/B   *not today* -Obstacle course (in hallway):  6" step up and down --> airex pad step up and over x 2: --> long Airex step-over with pad oriented horizontally   x5 laps D/B, CGA -4 square stepping; 5x CCW/CW with PT CGA -Full Tandem stance attempted, unable to maintain -Gait with ball toss in hallway; x 3 D/B length of hall (70-ft hall) -Multi-directional step on blue star; 5 directions (lateral R/L, anterolateral R/L, anterior); x 5 ea dir High knees with opposite knee tap, no weight; 3x D/B High knees with opposite knee tap, with 5-lb ankle weights; 3x D/B // bars: -Tandem walk along blue line; 2x D/B with difficulty maintaining without reliance on bars -Semitandem (3/4 Tandem) standing; 2 x 30 sec, bilat -Standing FT on Airex with perturbations; 2 x 1-minute bouts  -increased intensity of perturbations on second bout  -Standing FT on Airex x  30 sec  -minimal difficulty -Side stepping over 3 6-inch hurdles; 5x D/B length of bars  Lateral step up/down over Airex pad, alternating R/L over pad; 2x10 alternating R<>L -Anterior alternate toe taps, hands free, 2x12, 12" step with 2 # AW's.   -minimal UE support needed today  -Side stepping with Green Tband around ankles: 6x D/B length of // bars; Mod multimodal cuing for staying parallel to walls     PATIENT EDUCATION:  Education details: HEP, demo as well as verbal and tactile curing for exercise technique Person educated: Patient and Spouse Education method: Explanation, Facilities manager, and Handouts Education  comprehension: verbalized understanding     HOME EXERCISE PROGRAM: Access Code: [email or web address] URL: [email or web address] Date: 05/09/2023 Prepared by: Consuela Mimes  Exercises - Sit to Stand with Weight  - 2 x daily - 7 x weekly - 2-3 sets - 5-6 reps - Heel Toe Raises with Counter Support  - 2 x daily - 7 x weekly - 2 sets - 10 reps - Standing Hip Abduction with Counter Support  - 2 x daily - 7 x weekly - 2 sets - 10 reps - Standing Anterior Toe Taps  - 2 x daily - 7 x weekly - 2 sets - 10 reps - Standing Romberg to 1/2 Tandem Stance  - 2 x daily - 7 x weekly - 2-3 sets - 30sec hold - Standing Partial Lunge  - 1 x daily - 7 x weekly - 2 sets - 10 reps     ASSESSMENT:   CLINICAL IMPRESSION: Pt has made excellent progress toward goals and has demonstrated low risk of falls for almost all outcome measures utilized. He is 0.5 sec from cut-off for 5-Times Sit to Stand. Pt primarily needs to continue working on LE power and specifically sit to stands at home to address this minimal deficit. Pt has not experienced recent near-fall or safety incident; he has no major concern with functional mobility as needed for community-level gait and going to church as well as appointments. Pt has met his FOTO goal at previous goal update/re-assessment. With his current progress  demonstrated, pt is appropriate for transition to home program and discharge for formal PT.   REHAB POTENTIAL: Good   CLINICAL DECISION MAKING: Evolving/moderate complexity   EVALUATION COMPLEXITY: Moderate     GOALS: Goals reviewed with patient? No   SHORT TERM GOALS: Target date: 02/13/2023   Pt will be independent with HEP in order to improve strength and balance in order to decrease fall risk and improve function at home. Baseline: 01/22/23: Baseline HEP initiated.   02/28/23: Compliant with HEP     03/27/23: Inconsistent HEP compliance.   04/15/23: Pt reports limited compliance with HEP.    05/09/23: Partial compliance with HEP, not consistent.  Goal status: NOT MET    Pt will perform independent sit to stand without UE support and symmetrical weightbearing indicative of improved functional LE strength and ability to perform transferring Baseline: 01/22/23: Multiple attempts to perform sit to stand without push on armrests, unable to perform until patient pushed onto anterior thighs.   02/28/23: Sit to stand independent with no UE support, stable upon completion of transfer.  Goal status: ACHIEVED      LONG TERM GOALS: Target date: 04/03/2023   Pt will increase FOTO to at least 57 to demonstrate significant improvement in function at home related to balance  Baseline: 01/22/23: 48.   02/28/23: 73/57 Goal status: ACHIEVED    2.  Pt will improve BERG by at least 3 points in order to demonstrate clinically significant improvement in balance.   Baseline: 01/22/23: Baseline to be performed on visit # 2.   01/24/23: 47/56.   02/28/23: 49/56     03/27/23: 52/56 Goal status: ACHIEVED   3. Pt will perform 5TSTS in less than 12 seconds to demonstrate clinically significant improvement in LE strength      Baseline:  01/22/23: Baseline to be performed as sit to stand improves.   02/28/23: 23.5 sec     03/27/23: 21.3 sec    04/15/23: 15.9 sec.    05/09/23: 12.5 seconds Goal status:  IN PROGRESS/MOSTLY MET   5. Pt  will improve DGI by at least 3 points in order to demonstrate clinically significant improvement in balance and decreased risk for falls.     Baseline: 01/22/23: Baseline to be performed on visit # 2.   01/24/23: 18/24.   02/28/23: 20/24    03/27/23: 22/24 Goal status: ACHIEVED      PLAN: PT FREQUENCY: -   PT DURATION: -   PLANNED INTERVENTIONS: Therapeutic exercises, Therapeutic activity, Neuromuscular re-education, Balance training, Gait training, Patient/Family education, Electrical stimulation, Cryotherapy, Moist heat, and Manual therapy   PLAN FOR NEXT SESSION: Pt to continue with advanced HEP at this time. Discharge after today's visit.     Consuela Mimes, PT, DPT [phone]  Physical Therapist- [facility]  3:59 PM, 05/09/23

## 2023-05-10 ENCOUNTER — Telehealth: Payer: Self-pay

## 2023-05-10 NOTE — Telephone Encounter (Signed)
Transition Care Management Unsuccessful Follow-up Telephone Call  Date of discharge and from where:  [place] 8/10  Attempts:  2nd Attempt  Reason for unsuccessful TCM follow-up call:  No answer/busy   Lenard Forth [facility] Guide, [facility] [phone] [address] [place], [place], [place] [address] Phone: [phone] Email: [email or web address]

## 2023-05-10 NOTE — Telephone Encounter (Signed)
Transition Care Management Unsuccessful Follow-up Telephone Call  Date of discharge and from where:  [place] 8/10  Attempts:  1st Attempt  Reason for unsuccessful TCM follow-up call:  No answer/busy   Lenard Forth [facility] Guide,  [facility] [phone] [address] [place], [place], [place] [address] Phone: [phone] Email: [email or web address]

## 2023-05-13 DIAGNOSIS — Z79899 Other long term (current) drug therapy: Secondary | ICD-10-CM | POA: Diagnosis not present

## 2023-05-15 DIAGNOSIS — R531 Weakness: Secondary | ICD-10-CM | POA: Diagnosis not present

## 2023-05-15 DIAGNOSIS — G250 Essential tremor: Secondary | ICD-10-CM | POA: Diagnosis not present

## 2023-05-15 DIAGNOSIS — G4733 Obstructive sleep apnea (adult) (pediatric): Secondary | ICD-10-CM | POA: Diagnosis not present

## 2023-05-15 DIAGNOSIS — G3184 Mild cognitive impairment, so stated: Secondary | ICD-10-CM | POA: Diagnosis not present

## 2023-05-15 DIAGNOSIS — Z8673 Personal history of transient ischemic attack (TIA), and cerebral infarction without residual deficits: Secondary | ICD-10-CM | POA: Diagnosis not present

## 2023-05-23 ENCOUNTER — Encounter: Payer: PPO | Admitting: Physical Therapy

## 2023-05-30 DIAGNOSIS — I482 Chronic atrial fibrillation, unspecified: Secondary | ICD-10-CM | POA: Diagnosis not present

## 2023-05-30 DIAGNOSIS — I959 Hypotension, unspecified: Secondary | ICD-10-CM | POA: Diagnosis not present

## 2023-05-30 DIAGNOSIS — R5383 Other fatigue: Secondary | ICD-10-CM | POA: Diagnosis not present

## 2023-06-04 DIAGNOSIS — Z872 Personal history of diseases of the skin and subcutaneous tissue: Secondary | ICD-10-CM | POA: Diagnosis not present

## 2023-06-04 DIAGNOSIS — L821 Other seborrheic keratosis: Secondary | ICD-10-CM | POA: Diagnosis not present

## 2023-06-04 DIAGNOSIS — D485 Neoplasm of uncertain behavior of skin: Secondary | ICD-10-CM | POA: Diagnosis not present

## 2023-06-04 DIAGNOSIS — C44329 Squamous cell carcinoma of skin of other parts of face: Secondary | ICD-10-CM | POA: Diagnosis not present

## 2023-06-04 DIAGNOSIS — D225 Melanocytic nevi of trunk: Secondary | ICD-10-CM | POA: Diagnosis not present

## 2023-06-04 DIAGNOSIS — B354 Tinea corporis: Secondary | ICD-10-CM | POA: Diagnosis not present

## 2023-06-04 DIAGNOSIS — L570 Actinic keratosis: Secondary | ICD-10-CM | POA: Diagnosis not present

## 2023-06-04 DIAGNOSIS — L120 Bullous pemphigoid: Secondary | ICD-10-CM | POA: Diagnosis not present

## 2023-06-04 DIAGNOSIS — Z85828 Personal history of other malignant neoplasm of skin: Secondary | ICD-10-CM | POA: Diagnosis not present

## 2023-06-13 DIAGNOSIS — Z79899 Other long term (current) drug therapy: Secondary | ICD-10-CM | POA: Diagnosis not present

## 2023-06-13 DIAGNOSIS — L120 Bullous pemphigoid: Secondary | ICD-10-CM | POA: Diagnosis not present

## 2023-06-14 DIAGNOSIS — I2510 Atherosclerotic heart disease of native coronary artery without angina pectoris: Secondary | ICD-10-CM | POA: Diagnosis not present

## 2023-06-14 DIAGNOSIS — I059 Rheumatic mitral valve disease, unspecified: Secondary | ICD-10-CM | POA: Diagnosis not present

## 2023-06-14 DIAGNOSIS — I482 Chronic atrial fibrillation, unspecified: Secondary | ICD-10-CM | POA: Diagnosis not present

## 2023-06-14 DIAGNOSIS — I5020 Unspecified systolic (congestive) heart failure: Secondary | ICD-10-CM | POA: Diagnosis not present

## 2023-07-04 DIAGNOSIS — C44629 Squamous cell carcinoma of skin of left upper limb, including shoulder: Secondary | ICD-10-CM | POA: Diagnosis not present

## 2023-07-04 DIAGNOSIS — C44329 Squamous cell carcinoma of skin of other parts of face: Secondary | ICD-10-CM | POA: Diagnosis not present

## 2023-07-04 DIAGNOSIS — L570 Actinic keratosis: Secondary | ICD-10-CM | POA: Diagnosis not present

## 2023-07-04 DIAGNOSIS — D485 Neoplasm of uncertain behavior of skin: Secondary | ICD-10-CM | POA: Diagnosis not present

## 2023-07-09 DIAGNOSIS — E7800 Pure hypercholesterolemia, unspecified: Secondary | ICD-10-CM | POA: Diagnosis not present

## 2023-07-09 DIAGNOSIS — I2510 Atherosclerotic heart disease of native coronary artery without angina pectoris: Secondary | ICD-10-CM | POA: Diagnosis not present

## 2023-07-09 DIAGNOSIS — Z79899 Other long term (current) drug therapy: Secondary | ICD-10-CM | POA: Diagnosis not present

## 2023-07-09 DIAGNOSIS — R7303 Prediabetes: Secondary | ICD-10-CM | POA: Diagnosis not present

## 2023-07-09 DIAGNOSIS — I10 Essential (primary) hypertension: Secondary | ICD-10-CM | POA: Diagnosis not present

## 2023-07-09 DIAGNOSIS — G720 Drug-induced myopathy: Secondary | ICD-10-CM | POA: Diagnosis not present

## 2023-07-09 DIAGNOSIS — I5020 Unspecified systolic (congestive) heart failure: Secondary | ICD-10-CM | POA: Diagnosis not present

## 2023-07-09 DIAGNOSIS — E559 Vitamin D deficiency, unspecified: Secondary | ICD-10-CM | POA: Diagnosis not present

## 2023-07-09 DIAGNOSIS — T466X5A Adverse effect of antihyperlipidemic and antiarteriosclerotic drugs, initial encounter: Secondary | ICD-10-CM | POA: Diagnosis not present

## 2023-07-29 DIAGNOSIS — C44629 Squamous cell carcinoma of skin of left upper limb, including shoulder: Secondary | ICD-10-CM | POA: Diagnosis not present

## 2023-08-02 DIAGNOSIS — R399 Unspecified symptoms and signs involving the genitourinary system: Secondary | ICD-10-CM | POA: Diagnosis not present

## 2023-08-03 ENCOUNTER — Inpatient Hospital Stay
Admission: EM | Admit: 2023-08-03 | Discharge: 2023-08-07 | DRG: 725 | Disposition: A | Discharge status: Home / Self Care | Payer: PPO | Attending: Internal Medicine | Admitting: Internal Medicine

## 2023-08-03 ENCOUNTER — Other Ambulatory Visit: Payer: Self-pay

## 2023-08-03 DIAGNOSIS — N401 Enlarged prostate with lower urinary tract symptoms: Principal | ICD-10-CM | POA: Diagnosis present

## 2023-08-03 DIAGNOSIS — R6520 Severe sepsis without septic shock: Secondary | ICD-10-CM | POA: Diagnosis present

## 2023-08-03 DIAGNOSIS — G4733 Obstructive sleep apnea (adult) (pediatric): Secondary | ICD-10-CM | POA: Diagnosis present

## 2023-08-03 DIAGNOSIS — I4891 Unspecified atrial fibrillation: Secondary | ICD-10-CM | POA: Diagnosis not present

## 2023-08-03 DIAGNOSIS — I255 Ischemic cardiomyopathy: Secondary | ICD-10-CM | POA: Diagnosis present

## 2023-08-03 DIAGNOSIS — E785 Hyperlipidemia, unspecified: Secondary | ICD-10-CM | POA: Diagnosis present

## 2023-08-03 DIAGNOSIS — I2510 Atherosclerotic heart disease of native coronary artery without angina pectoris: Secondary | ICD-10-CM | POA: Diagnosis present

## 2023-08-03 DIAGNOSIS — I110 Hypertensive heart disease with heart failure: Secondary | ICD-10-CM | POA: Diagnosis present

## 2023-08-03 DIAGNOSIS — Z8673 Personal history of transient ischemic attack (TIA), and cerebral infarction without residual deficits: Secondary | ICD-10-CM

## 2023-08-03 DIAGNOSIS — Z951 Presence of aortocoronary bypass graft: Secondary | ICD-10-CM

## 2023-08-03 DIAGNOSIS — G3184 Mild cognitive impairment, so stated: Secondary | ICD-10-CM | POA: Diagnosis present

## 2023-08-03 DIAGNOSIS — Z981 Arthrodesis status: Secondary | ICD-10-CM

## 2023-08-03 DIAGNOSIS — G4730 Sleep apnea, unspecified: Secondary | ICD-10-CM | POA: Diagnosis present

## 2023-08-03 DIAGNOSIS — Z888 Allergy status to other drugs, medicaments and biological substances status: Secondary | ICD-10-CM

## 2023-08-03 DIAGNOSIS — I482 Chronic atrial fibrillation, unspecified: Secondary | ICD-10-CM | POA: Diagnosis present

## 2023-08-03 DIAGNOSIS — Z87440 Personal history of urinary (tract) infections: Secondary | ICD-10-CM

## 2023-08-03 DIAGNOSIS — A419 Sepsis, unspecified organism: Secondary | ICD-10-CM | POA: Diagnosis not present

## 2023-08-03 DIAGNOSIS — G9341 Metabolic encephalopathy: Secondary | ICD-10-CM | POA: Diagnosis present

## 2023-08-03 DIAGNOSIS — Z7952 Long term (current) use of systemic steroids: Secondary | ICD-10-CM

## 2023-08-03 DIAGNOSIS — Z79631 Long term (current) use of antimetabolite agent: Secondary | ICD-10-CM

## 2023-08-03 DIAGNOSIS — L120 Bullous pemphigoid: Secondary | ICD-10-CM | POA: Diagnosis present

## 2023-08-03 DIAGNOSIS — D696 Thrombocytopenia, unspecified: Secondary | ICD-10-CM | POA: Diagnosis present

## 2023-08-03 DIAGNOSIS — Z85828 Personal history of other malignant neoplasm of skin: Secondary | ICD-10-CM

## 2023-08-03 DIAGNOSIS — Z872 Personal history of diseases of the skin and subcutaneous tissue: Secondary | ICD-10-CM

## 2023-08-03 DIAGNOSIS — R338 Other retention of urine: Secondary | ICD-10-CM

## 2023-08-03 DIAGNOSIS — T380X5A Adverse effect of glucocorticoids and synthetic analogues, initial encounter: Secondary | ICD-10-CM | POA: Insufficient documentation

## 2023-08-03 DIAGNOSIS — Z952 Presence of prosthetic heart valve: Secondary | ICD-10-CM

## 2023-08-03 DIAGNOSIS — Z515 Encounter for palliative care: Secondary | ICD-10-CM

## 2023-08-03 DIAGNOSIS — R6510 Systemic inflammatory response syndrome (SIRS) of non-infectious origin without acute organ dysfunction: Secondary | ICD-10-CM | POA: Diagnosis present

## 2023-08-03 DIAGNOSIS — N138 Other obstructive and reflux uropathy: Secondary | ICD-10-CM

## 2023-08-03 DIAGNOSIS — Z7901 Long term (current) use of anticoagulants: Secondary | ICD-10-CM

## 2023-08-03 DIAGNOSIS — G9340 Encephalopathy, unspecified: Secondary | ICD-10-CM | POA: Diagnosis not present

## 2023-08-03 DIAGNOSIS — D649 Anemia, unspecified: Secondary | ICD-10-CM | POA: Insufficient documentation

## 2023-08-03 DIAGNOSIS — I252 Old myocardial infarction: Secondary | ICD-10-CM

## 2023-08-03 DIAGNOSIS — I5022 Chronic systolic (congestive) heart failure: Secondary | ICD-10-CM | POA: Diagnosis present

## 2023-08-03 DIAGNOSIS — R509 Fever, unspecified: Secondary | ICD-10-CM | POA: Diagnosis not present

## 2023-08-03 DIAGNOSIS — R4182 Altered mental status, unspecified: Secondary | ICD-10-CM | POA: Diagnosis not present

## 2023-08-03 DIAGNOSIS — Z79899 Other long term (current) drug therapy: Secondary | ICD-10-CM

## 2023-08-03 DIAGNOSIS — D638 Anemia in other chronic diseases classified elsewhere: Secondary | ICD-10-CM | POA: Diagnosis present

## 2023-08-03 DIAGNOSIS — M1909 Primary osteoarthritis, other specified site: Secondary | ICD-10-CM | POA: Diagnosis present

## 2023-08-03 DIAGNOSIS — N179 Acute kidney failure, unspecified: Secondary | ICD-10-CM

## 2023-08-03 DIAGNOSIS — N390 Urinary tract infection, site not specified: Principal | ICD-10-CM

## 2023-08-03 DIAGNOSIS — D84821 Immunodeficiency due to drugs: Secondary | ICD-10-CM | POA: Diagnosis present

## 2023-08-03 DIAGNOSIS — R000 Tachycardia, unspecified: Secondary | ICD-10-CM | POA: Diagnosis not present

## 2023-08-03 DIAGNOSIS — R339 Retention of urine, unspecified: Secondary | ICD-10-CM | POA: Diagnosis present

## 2023-08-03 DIAGNOSIS — I5020 Unspecified systolic (congestive) heart failure: Secondary | ICD-10-CM | POA: Diagnosis present

## 2023-08-03 DIAGNOSIS — R0689 Other abnormalities of breathing: Secondary | ICD-10-CM | POA: Diagnosis not present

## 2023-08-03 MED ORDER — LACTATED RINGERS IV BOLUS
500.0000 mL | Freq: Once | INTRAVENOUS | Status: AC
  Administered 2023-08-04: 500 mL via INTRAVENOUS

## 2023-08-03 MED ORDER — LACTATED RINGERS IV BOLUS: 1000.0000 mL | Freq: Once | INTRAVENOUS | Status: DC

## 2023-08-03 NOTE — ED Triage Notes (Signed)
Patient arrives from home by EMS.  Wife states that patient has hx of UTI, and has been displaying symptoms.  She says that he is more altered than normal.  He is normally A&O x3, but now only oriented to person.  She also states the patient has been experiencing urinary retention.

## 2023-08-03 NOTE — ED Provider Notes (Signed)
[facility] Provider Note    Event Date/Time   First MD Initiated Contact with Patient 08/03/23 2344     (approximate)   History   Altered Mental Status   HPI  Juan [name] is a 82 y.o. male who presents to the ED for evaluation of Altered Mental Status   I review a cardiology clinic visit from last month.  History of CAD and CHF.  Remote CABG.  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  He is uncertain why he is here and unable provide any relevant history.  History is provided by his wife the bedside later.  She reports that he had dribbling urine and poor voids for about 4 days and became more confused in the past 1 day.  Unable to provide a urine sample to PCP office.  No recent antibiotics.  No falls.   Physical Exam   Triage Vital Signs: ED Triage Vitals  Encounter Vitals Group     BP 08/03/23 2348 129/89     Systolic BP Percentile --      Diastolic BP Percentile --      Pulse Rate 08/03/23 2348 (!) 120     Resp 08/03/23 2348 (!) 21     Temp 08/03/23 2348 98.8 F (37.1 C)     Temp src --      SpO2 08/03/23 2348 100 %     Weight --      Height --      Head Circumference --      Peak Flow --      Pain Score 08/03/23 2350 0     Pain Loc --      Pain Education --      Exclude from Growth Chart --     Most recent vital signs: Vitals:   08/04/23 0130 08/04/23 0200  BP: 104/76 102/73  Pulse: (!) 101 98  Resp: 14 20  Temp:    SpO2: 98% 100%    General: Awake, no distress.  Nonfocal and pleasantly disoriented CV:  Good peripheral perfusion.  Irregular Resp:  Normal effort.  Abd:  No distention.  Suprapubic tenderness, benign upper abdomen MSK:  No deformity noted.  Neuro:  No focal deficits appreciated. Other:     ED Results / Procedures /  Treatments   Labs (all labs ordered are listed, but only abnormal results are displayed) Labs Reviewed  CBC WITH DIFFERENTIAL/PLATELET - Abnormal; Notable for the following components:      Result Value   WBC 10.8 (*)    RBC 3.63 (*)    Hemoglobin 12.2 (*)    HCT 35.8 (*)    Platelets 115 (*)    Neutro Abs 9.5 (*)    Lymphs Abs 0.3 (*)    All other components within normal limits  URINALYSIS, ROUTINE W REFLEX MICROSCOPIC - Abnormal; Notable for the following components:   Color, Urine AMBER (*)    APPearance TURBID (*)    Hgb urine dipstick MODERATE (*)    Ketones, ur 5 (*)    Protein, ur 100 (*)    Leukocytes,Ua LARGE (*)    Bacteria, UA RARE (*)    All other components within normal limits  COMPREHENSIVE METABOLIC PANEL - Abnormal; Notable for the following components:   Sodium 127 (*)    Chloride  97 (*)    Glucose, Bld 114 (*)    BUN 34 (*)    Creatinine, Ser 1.41 (*)    Calcium 8.5 (*)    Total Protein 6.3 (*)    AST 69 (*)    Total Bilirubin 2.1 (*)    GFR, Estimated 49 (*)    All other components within normal limits  URINE CULTURE  CULTURE, BLOOD (ROUTINE X 2)  CULTURE, BLOOD (ROUTINE X 2)  LACTIC ACID, PLASMA    EKG A-fib with rate of 115 bpm.  Normal axis.  Left bundle morphology.  Frequent PVCs.  No STEMI.  RADIOLOGY CT head interpreted by me without evidence of acute intracranial pathology CXR interpreted by me without evidence of acute cardiopulmonary pathology.  Official radiology report(s): DG Chest Portable 1 View  Result Date: 08/04/2023 CLINICAL DATA:  Altered mental status. EXAM: PORTABLE CHEST 1 VIEW COMPARISON:  December 06, 2017 FINDINGS: Multiple sternal wires are noted. The cardiac silhouette is mildly enlarged and unchanged in size. An artificial cardiac valve is seen. There is mild, stable elevation of the left hemidiaphragm. There is no evidence of an acute infiltrate, pleural effusion or pneumothorax. The visualized skeletal structures are  unremarkable. IMPRESSION: 1. Evidence of prior median sternotomy and artificial cardiac valve placement. 2. No acute or active cardiopulmonary disease. Electronically Signed   By: Aram Candela M.D.   On: 08/04/2023 00:58   CT HEAD WO CONTRAST ( )  Result Date: 08/04/2023 CLINICAL DATA:  altered on eliquis, eval ich EXAM: CT HEAD WITHOUT CONTRAST TECHNIQUE: Contiguous axial images were obtained from the base of the skull through the vertex without intravenous contrast. RADIATION DOSE REDUCTION: This exam was performed according to the departmental dose-optimization program which includes automated exposure control, adjustment of the mA and/or kV according to patient size and/or use of iterative reconstruction technique. COMPARISON:  CT head 05/04/2023, MRA head 05/22/2022, MRI head 06/16/2018 FINDINGS: Brain: Cerebral ventricle sizes are concordant with the degree of cerebral volume loss. Patchy and confluent areas of decreased attenuation are noted throughout the deep and periventricular white matter of the cerebral hemispheres bilaterally, compatible with chronic microvascular ischemic disease. Chronic lacunar left basal ganglia insular region infarctions. Chronic left cerebellar infarction. No evidence of large-territorial acute infarction. No parenchymal hemorrhage. No mass lesion. No extra-axial collection. No mass effect or midline shift. No hydrocephalus. Basilar cisterns are patent. Vascular: No hyperdense vessel. Atherosclerotic calcifications are present within the cavernous internal carotid arteries. Gas noted within the venous system likely due to IV placement. Skull: No acute fracture or focal lesion. Sinuses/Orbits: Paranasal sinuses and mastoid air cells are clear. Bilateral lens replacement. Otherwise the orbits are unremarkable. Other: None. IMPRESSION: No acute intracranial abnormality. Electronically Signed   By: Tish Frederickson M.D.   On: 08/04/2023 00:44    PROCEDURES and  INTERVENTIONS:  .1-3 Lead EKG Interpretation  Performed by: Delton Prairie, MD Authorized by: Delton Prairie, MD     Interpretation: abnormal     ECG rate:  111   ECG rate assessment: tachycardic     Rhythm: atrial fibrillation     Ectopy: none     Conduction: normal   .Critical Care  Performed by: Delton Prairie, MD Authorized by: Delton Prairie, MD   Critical care provider statement:    Critical care time (minutes):  30   Critical care time was exclusive of:  Separately billable procedures and treating other patients   Critical care was necessary to treat or prevent imminent or life-threatening deterioration  of the following conditions:  Sepsis   Critical care was time spent personally by me on the following activities:  Development of treatment plan with patient or surrogate, discussions with consultants, evaluation of patient's response to treatment, examination of patient, ordering and review of laboratory studies, ordering and review of radiographic studies, ordering and performing treatments and interventions, pulse oximetry, re-evaluation of patient's condition and review of old charts   Medications  digoxin (LANOXIN) tablet 0.125 mg (has no administration in time range)  apixaban (ELIQUIS) tablet 5 mg (has no administration in time range)  tamsulosin (FLOMAX) capsule 0.4 mg (has no administration in time range)  cefTRIAXone (ROCEPHIN) 2 g in sodium chloride 0.9 % 100 mL IVPB (has no administration in time range)  acetaminophen (TYLENOL) tablet 650 mg (has no administration in time range)    Or  acetaminophen (TYLENOL) suppository 650 mg (has no administration in time range)  ondansetron (ZOFRAN) tablet 4 mg (has no administration in time range)    Or  ondansetron (ZOFRAN) injection 4 mg (has no administration in time range)  lactated ringers infusion (has no administration in time range)  lactated ringers bolus 500 mL (0 mLs Intravenous Stopped 08/04/23 0057)  cefTRIAXone  (ROCEPHIN) 2 g in sodium chloride 0.9 % 100 mL IVPB (0 g Intravenous Stopped 08/04/23 0159)     IMPRESSION / MDM / ASSESSMENT AND PLAN / ED COURSE  I reviewed the triage vital signs and the nursing notes.  Differential diagnosis includes, but is not limited to, ICH, sepsis, dehydration, metabolic encephalopathy, hyponatremia  {Patient presents with symptoms of an acute illness or injury that is potentially life-threatening.  Patient presents with signs of urosepsis and acute metabolic encephalopathy requiring medical admission.  Tachycardic and chronic A-fib, improving with bladder catheterization and small fluid bolus.  Remained stable without signs of shock.  Blood work with mild leukocytosis.  Urine is concerning for infection of the sent for culture.  He is started on antibiotics for this.  Has a normal lactic acid but some mild metabolic derangements with mild hyponatremia and prerenal azotemia.  CXR and CT head are reassuring without evidence of further causes of his encephalopathy.  No focal deficits.  Suspect metabolic in setting of sepsis.  Consult medicine for admission.  Clinical Course as of 08/04/23 1610  Sat Aug 03, 2023  2354 Patient is attempted to void multiple occasions since arriving to the ED but unable to pass any urine, 5-600 on the bladder scanner.  We will place Foley catheter [DS]  Sun Aug 04, 2023  0159 Reassessed and discussed plan of care with wife. [DS]    Clinical Course User Index [DS] Delton Prairie, MD     FINAL CLINICAL IMPRESSION(S) / ED DIAGNOSES   Final diagnoses:  Sepsis secondary to UTI [facility])  Acute metabolic encephalopathy     Rx / DC Orders   ED Discharge Orders     None        Note:  This document was prepared using Dragon voice recognition software and may include unintentional dictation errors.   Delton Prairie, MD 08/04/23 [phone]

## 2023-08-04 ENCOUNTER — Emergency Department: Payer: PPO

## 2023-08-04 DIAGNOSIS — Z952 Presence of prosthetic heart valve: Secondary | ICD-10-CM | POA: Diagnosis not present

## 2023-08-04 DIAGNOSIS — I255 Ischemic cardiomyopathy: Secondary | ICD-10-CM | POA: Diagnosis not present

## 2023-08-04 DIAGNOSIS — D696 Thrombocytopenia, unspecified: Secondary | ICD-10-CM | POA: Diagnosis not present

## 2023-08-04 DIAGNOSIS — I4891 Unspecified atrial fibrillation: Secondary | ICD-10-CM | POA: Diagnosis not present

## 2023-08-04 DIAGNOSIS — R4182 Altered mental status, unspecified: Secondary | ICD-10-CM | POA: Diagnosis not present

## 2023-08-04 DIAGNOSIS — E785 Hyperlipidemia, unspecified: Secondary | ICD-10-CM | POA: Diagnosis not present

## 2023-08-04 DIAGNOSIS — Z888 Allergy status to other drugs, medicaments and biological substances status: Secondary | ICD-10-CM | POA: Diagnosis not present

## 2023-08-04 DIAGNOSIS — G9341 Metabolic encephalopathy: Secondary | ICD-10-CM | POA: Diagnosis not present

## 2023-08-04 DIAGNOSIS — G4730 Sleep apnea, unspecified: Secondary | ICD-10-CM | POA: Diagnosis not present

## 2023-08-04 DIAGNOSIS — G3184 Mild cognitive impairment, so stated: Secondary | ICD-10-CM

## 2023-08-04 DIAGNOSIS — R6520 Severe sepsis without septic shock: Secondary | ICD-10-CM | POA: Diagnosis present

## 2023-08-04 DIAGNOSIS — I25810 Atherosclerosis of coronary artery bypass graft(s) without angina pectoris: Secondary | ICD-10-CM | POA: Diagnosis not present

## 2023-08-04 DIAGNOSIS — Z951 Presence of aortocoronary bypass graft: Secondary | ICD-10-CM | POA: Diagnosis not present

## 2023-08-04 DIAGNOSIS — Z7952 Long term (current) use of systemic steroids: Secondary | ICD-10-CM | POA: Insufficient documentation

## 2023-08-04 DIAGNOSIS — L120 Bullous pemphigoid: Secondary | ICD-10-CM | POA: Diagnosis not present

## 2023-08-04 DIAGNOSIS — R9389 Abnormal findings on diagnostic imaging of other specified body structures: Secondary | ICD-10-CM | POA: Diagnosis not present

## 2023-08-04 DIAGNOSIS — N179 Acute kidney failure, unspecified: Secondary | ICD-10-CM

## 2023-08-04 DIAGNOSIS — N390 Urinary tract infection, site not specified: Secondary | ICD-10-CM | POA: Diagnosis not present

## 2023-08-04 DIAGNOSIS — N401 Enlarged prostate with lower urinary tract symptoms: Secondary | ICD-10-CM | POA: Diagnosis not present

## 2023-08-04 DIAGNOSIS — M1909 Primary osteoarthritis, other specified site: Secondary | ICD-10-CM | POA: Diagnosis not present

## 2023-08-04 DIAGNOSIS — A419 Sepsis, unspecified organism: Secondary | ICD-10-CM | POA: Diagnosis not present

## 2023-08-04 DIAGNOSIS — I5022 Chronic systolic (congestive) heart failure: Secondary | ICD-10-CM | POA: Diagnosis not present

## 2023-08-04 DIAGNOSIS — I2510 Atherosclerotic heart disease of native coronary artery without angina pectoris: Secondary | ICD-10-CM | POA: Diagnosis not present

## 2023-08-04 DIAGNOSIS — N138 Other obstructive and reflux uropathy: Secondary | ICD-10-CM

## 2023-08-04 DIAGNOSIS — I5020 Unspecified systolic (congestive) heart failure: Secondary | ICD-10-CM | POA: Diagnosis not present

## 2023-08-04 DIAGNOSIS — D649 Anemia, unspecified: Secondary | ICD-10-CM | POA: Insufficient documentation

## 2023-08-04 DIAGNOSIS — G4733 Obstructive sleep apnea (adult) (pediatric): Secondary | ICD-10-CM | POA: Diagnosis not present

## 2023-08-04 DIAGNOSIS — I252 Old myocardial infarction: Secondary | ICD-10-CM | POA: Diagnosis not present

## 2023-08-04 DIAGNOSIS — Z515 Encounter for palliative care: Secondary | ICD-10-CM | POA: Diagnosis not present

## 2023-08-04 DIAGNOSIS — D638 Anemia in other chronic diseases classified elsewhere: Secondary | ICD-10-CM | POA: Diagnosis not present

## 2023-08-04 DIAGNOSIS — R9082 White matter disease, unspecified: Secondary | ICD-10-CM | POA: Diagnosis not present

## 2023-08-04 DIAGNOSIS — Z872 Personal history of diseases of the skin and subcutaneous tissue: Secondary | ICD-10-CM

## 2023-08-04 DIAGNOSIS — R338 Other retention of urine: Secondary | ICD-10-CM

## 2023-08-04 DIAGNOSIS — R6510 Systemic inflammatory response syndrome (SIRS) of non-infectious origin without acute organ dysfunction: Secondary | ICD-10-CM | POA: Diagnosis not present

## 2023-08-04 DIAGNOSIS — Z7901 Long term (current) use of anticoagulants: Secondary | ICD-10-CM | POA: Diagnosis not present

## 2023-08-04 DIAGNOSIS — Z7189 Other specified counseling: Secondary | ICD-10-CM | POA: Diagnosis not present

## 2023-08-04 DIAGNOSIS — I482 Chronic atrial fibrillation, unspecified: Secondary | ICD-10-CM | POA: Diagnosis not present

## 2023-08-04 DIAGNOSIS — I110 Hypertensive heart disease with heart failure: Secondary | ICD-10-CM | POA: Diagnosis not present

## 2023-08-04 DIAGNOSIS — D84821 Immunodeficiency due to drugs: Secondary | ICD-10-CM | POA: Diagnosis not present

## 2023-08-04 DIAGNOSIS — Z79899 Other long term (current) drug therapy: Secondary | ICD-10-CM | POA: Diagnosis not present

## 2023-08-04 DIAGNOSIS — I672 Cerebral atherosclerosis: Secondary | ICD-10-CM | POA: Diagnosis not present

## 2023-08-04 DIAGNOSIS — R339 Retention of urine, unspecified: Secondary | ICD-10-CM | POA: Diagnosis not present

## 2023-08-04 LAB — CBC WITH DIFFERENTIAL/PLATELET
Abs Immature Granulocytes: 0.05 10*3/uL (ref 0.00–0.07)
Basophils Absolute: 0 10*3/uL (ref 0.0–0.1)
Basophils Relative: 0 %
Eosinophils Absolute: 0 10*3/uL (ref 0.0–0.5)
Eosinophils Relative: 0 %
HCT: 35.8 % — ABNORMAL LOW (ref 39.0–52.0)
Hemoglobin: 12.2 g/dL — ABNORMAL LOW (ref 13.0–17.0)
Immature Granulocytes: 1 %
Lymphocytes Relative: 3 %
Lymphs Abs: 0.3 10*3/uL — ABNORMAL LOW (ref 0.7–4.0)
MCH: 33.6 pg (ref 26.0–34.0)
MCHC: 34.1 g/dL (ref 30.0–36.0)
MCV: 98.6 fL (ref 80.0–100.0)
Monocytes Absolute: 0.9 10*3/uL (ref 0.1–1.0)
Monocytes Relative: 8 %
Neutro Abs: 9.5 10*3/uL — ABNORMAL HIGH (ref 1.7–7.7)
Neutrophils Relative %: 88 %
Platelets: 115 10*3/uL — ABNORMAL LOW (ref 150–400)
RBC: 3.63 MIL/uL — ABNORMAL LOW (ref 4.22–5.81)
RDW: 14 % (ref 11.5–15.5)
WBC: 10.8 10*3/uL — ABNORMAL HIGH (ref 4.0–10.5)
nRBC: 0 % (ref 0.0–0.2)

## 2023-08-04 LAB — URINALYSIS, ROUTINE W REFLEX MICROSCOPIC
Bilirubin Urine: NEGATIVE
Glucose, UA: NEGATIVE mg/dL
Ketones, ur: 5 mg/dL — AB
Nitrite: NEGATIVE
Protein, ur: 100 mg/dL — AB
RBC / HPF: >50 RBC/hpf (ref 0–5)
Specific Gravity, Urine: 1.012 (ref 1.005–1.030)
Squamous Epithelial / HPF: 0 /[HPF] (ref 0–5)
WBC, UA: >50 WBC/hpf (ref 0–5)
pH: 8 (ref 5.0–8.0)

## 2023-08-04 LAB — BASIC METABOLIC PANEL
Anion gap: 8 (ref 5–15)
BUN: 24 mg/dL — ABNORMAL HIGH (ref 8–23)
CO2: 24 mmol/L (ref 22–32)
Calcium: 8.4 mg/dL — ABNORMAL LOW (ref 8.9–10.3)
Chloride: 99 mmol/L (ref 98–111)
Creatinine, Ser: 1.03 mg/dL (ref 0.61–1.24)
GFR, Estimated: >60 mL/min (ref >60)
Glucose, Bld: 132 mg/dL — ABNORMAL HIGH (ref 70–99)
Potassium: 3.8 mmol/L (ref 3.5–5.1)
Sodium: 131 mmol/L — ABNORMAL LOW (ref 135–145)

## 2023-08-04 LAB — CBC
HCT: 32.6 % — ABNORMAL LOW (ref 39.0–52.0)
Hemoglobin: 11.2 g/dL — ABNORMAL LOW (ref 13.0–17.0)
MCH: 33.2 pg (ref 26.0–34.0)
MCHC: 34.4 g/dL (ref 30.0–36.0)
MCV: 96.7 fL (ref 80.0–100.0)
Platelets: 111 10*3/uL — ABNORMAL LOW (ref 150–400)
RBC: 3.37 MIL/uL — ABNORMAL LOW (ref 4.22–5.81)
RDW: 14.1 % (ref 11.5–15.5)
WBC: 7.4 10*3/uL (ref 4.0–10.5)
nRBC: 0 % (ref 0.0–0.2)

## 2023-08-04 LAB — COMPREHENSIVE METABOLIC PANEL
ALT: 24 U/L (ref 0–44)
AST: 69 U/L — ABNORMAL HIGH (ref 15–41)
Albumin: 3.7 g/dL (ref 3.5–5.0)
Alkaline Phosphatase: 48 U/L (ref 38–126)
Anion gap: 8 (ref 5–15)
BUN: 34 mg/dL — ABNORMAL HIGH (ref 8–23)
CO2: 22 mmol/L (ref 22–32)
Calcium: 8.5 mg/dL — ABNORMAL LOW (ref 8.9–10.3)
Chloride: 97 mmol/L — ABNORMAL LOW (ref 98–111)
Creatinine, Ser: 1.41 mg/dL — ABNORMAL HIGH (ref 0.61–1.24)
GFR, Estimated: 49 mL/min — ABNORMAL LOW (ref >60)
Glucose, Bld: 114 mg/dL — ABNORMAL HIGH (ref 70–99)
Potassium: 4.6 mmol/L (ref 3.5–5.1)
Sodium: 127 mmol/L — ABNORMAL LOW (ref 135–145)
Total Bilirubin: 2.1 mg/dL — ABNORMAL HIGH (ref <1.2)
Total Protein: 6.3 g/dL — ABNORMAL LOW (ref 6.5–8.1)

## 2023-08-04 LAB — LACTIC ACID, PLASMA: Lactic Acid, Venous: 1.9 mmol/L (ref 0.5–1.9)

## 2023-08-04 LAB — PHOSPHORUS: Phosphorus: 2.7 mg/dL (ref 2.5–4.6)

## 2023-08-04 LAB — VITAMIN D 25 HYDROXY (VIT D DEFICIENCY, FRACTURES): Vit D, 25-Hydroxy: 66.59 ng/mL (ref 30–100)

## 2023-08-04 LAB — HEPATIC FUNCTION PANEL
ALT: 24 U/L (ref 0–44)
AST: 72 U/L — ABNORMAL HIGH (ref 15–41)
Albumin: 3.2 g/dL — ABNORMAL LOW (ref 3.5–5.0)
Alkaline Phosphatase: 41 U/L (ref 38–126)
Bilirubin, Direct: 0.3 mg/dL — ABNORMAL HIGH (ref 0.0–0.2)
Indirect Bilirubin: 0.9 mg/dL (ref 0.3–0.9)
Total Bilirubin: 1.2 mg/dL — ABNORMAL HIGH (ref <1.2)
Total Protein: 5.6 g/dL — ABNORMAL LOW (ref 6.5–8.1)

## 2023-08-04 LAB — OSMOLALITY: Osmolality: 281 mosm/kg (ref 275–295)

## 2023-08-04 LAB — MAGNESIUM: Magnesium: 1.9 mg/dL (ref 1.7–2.4)

## 2023-08-04 MED ORDER — ONDANSETRON HCL 4 MG PO TABS: 4.0000 mg | ORAL_TABLET | Freq: Four times a day (QID) | ORAL | Status: DC | PRN

## 2023-08-04 MED ORDER — POTASSIUM CHLORIDE CRYS ER 20 MEQ PO TBCR
20.0000 meq | EXTENDED_RELEASE_TABLET | Freq: Once | ORAL | Status: AC
  Administered 2023-08-04: 20 meq via ORAL
  Filled 2023-08-04: qty 1

## 2023-08-04 MED ORDER — DIGOXIN 125 MCG PO TABS
0.1250 mg | ORAL_TABLET | Freq: Every day | ORAL | Status: DC
  Administered 2023-08-04 – 2023-08-07 (×4): 0.125 mg via ORAL
  Filled 2023-08-04 (×5): qty 1

## 2023-08-04 MED ORDER — METOPROLOL TARTRATE 25 MG PO TABS
25.0000 mg | ORAL_TABLET | Freq: Two times a day (BID) | ORAL | Status: DC
  Administered 2023-08-05 – 2023-08-07 (×5): 25 mg via ORAL
  Filled 2023-08-04 (×5): qty 1

## 2023-08-04 MED ORDER — LACTATED RINGERS IV SOLN: INTRAVENOUS | Status: AC

## 2023-08-04 MED ORDER — APIXABAN 5 MG PO TABS
5.0000 mg | ORAL_TABLET | Freq: Two times a day (BID) | ORAL | Status: DC
  Administered 2023-08-04 – 2023-08-07 (×7): 5 mg via ORAL
  Filled 2023-08-04 (×7): qty 1

## 2023-08-04 MED ORDER — ACETAMINOPHEN 325 MG PO TABS
650.0000 mg | ORAL_TABLET | Freq: Four times a day (QID) | ORAL | Status: DC | PRN
  Administered 2023-08-04 (×2): 650 mg via ORAL
  Filled 2023-08-04 (×2): qty 2

## 2023-08-04 MED ORDER — DILTIAZEM HCL 25 MG/5ML IV SOLN
10.0000 mg | Freq: Once | INTRAVENOUS | Status: AC
  Administered 2023-08-04: 10 mg via INTRAVENOUS
  Filled 2023-08-04: qty 5

## 2023-08-04 MED ORDER — TAMSULOSIN HCL 0.4 MG PO CAPS
0.4000 mg | ORAL_CAPSULE | Freq: Every day | ORAL | Status: DC
  Administered 2023-08-04 – 2023-08-07 (×4): 0.4 mg via ORAL
  Filled 2023-08-04 (×4): qty 1

## 2023-08-04 MED ORDER — ONDANSETRON HCL 4 MG/2ML IJ SOLN: 4.0000 mg | Freq: Four times a day (QID) | INTRAMUSCULAR | Status: DC | PRN

## 2023-08-04 MED ORDER — ACETAMINOPHEN 650 MG RE SUPP: 650.0000 mg | Freq: Four times a day (QID) | RECTAL | Status: DC | PRN

## 2023-08-04 MED ORDER — MAGNESIUM OXIDE -MG SUPPLEMENT 400 (240 MG) MG PO TABS
400.0000 mg | ORAL_TABLET | Freq: Two times a day (BID) | ORAL | Status: DC
  Administered 2023-08-04 – 2023-08-07 (×6): 400 mg via ORAL
  Filled 2023-08-04 (×6): qty 1

## 2023-08-04 MED ORDER — SODIUM CHLORIDE 0.9 % IV SOLN
2.0000 g | INTRAVENOUS | Status: DC
  Administered 2023-08-05 – 2023-08-06 (×3): 2 g via INTRAVENOUS
  Filled 2023-08-04 (×3): qty 20

## 2023-08-04 MED ORDER — METOPROLOL TARTRATE 5 MG/5ML IV SOLN
5.0000 mg | Freq: Once | INTRAVENOUS | Status: AC
  Administered 2023-08-04: 5 mg via INTRAVENOUS
  Filled 2023-08-04: qty 5

## 2023-08-04 MED ORDER — SODIUM CHLORIDE 0.9 % IV SOLN
2.0000 g | Freq: Once | INTRAVENOUS | Status: AC
  Administered 2023-08-04: 2 g via INTRAVENOUS
  Filled 2023-08-04: qty 20

## 2023-08-04 NOTE — ED Notes (Signed)
Notified RN of pt's BP & HR.

## 2023-08-04 NOTE — Assessment & Plan Note (Signed)
Chronic thrombocytopenia.  Platelets 115,000, baseline 130,000-140,000

## 2023-08-04 NOTE — Plan of Care (Signed)
Patient was seen and examined at bedside, patient is slightly confused, AO x 2, denied any complaints, no chest pain or palpitation no shortness of breath, no abdominal pain. We will continue current treatment.  Follow urine culture and blood cultures Management plan discussed with family at bedside.

## 2023-08-04 NOTE — Assessment & Plan Note (Addendum)
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

## 2023-08-04 NOTE — Consult Note (Signed)
Palliative Medicine  Name: Juan [name] Date: 08/04/2023 MRN: [id number]  DOB: 02/05/1941  Patient Care Team: Marguarite Arbour, MD as PCP - General (Internal Medicine)    REASON FOR CONSULTATION: Juan [name] is a 82 y.o. 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  Patient was admitted to the hospital on 08/04/2023 with sepsis secondary to UTI.  Palliative care consulted to address goals of  SOCIAL HISTORY:     reports that he has never smoked. He has never used smokeless tobacco. He reports that he does not drink alcohol and does not use drugs.  Patient married and lives at home with his wife.  He has a daughter who lives nearby and is no cancer and another daughter [name]. 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   ADVANCE DIRECTIVES:  Not on file  CODE STATUS: Full code  PAST MEDICAL HISTORY: Past Medical History:  Diagnosis Date   Arthritis    back- low   Atrial fibrillation (HCC)    Cancer (HCC)    skin ( basal) cell , face, head, back, inside L ear   Coronary artery disease    Dysrhythmia    MI (myocardial infarction) (HCC)    Sleep apnea    +Cpap in use nightly , last study- 10 yrs. ago   Stroke [facility])    "light stroke"    PAST SURGICAL HISTORY:  Past Surgical History:  Procedure Laterality Date   APPENDECTOMY     BACK SURGERY  09/2011   lumbar fusion    CARDIAC CATHETERIZATION     CARDIAC SURGERY     CARDIAC VALVE REPLACEMENT     CORONARY ARTERY BYPASS GRAFT     EYE SURGERY Bilateral    catarascts removed-    HERNIA REPAIR Bilateral 1946, 1990's   inguinal x3 procedures    LUMBAR LAMINECTOMY/DECOMPRESSION MICRODISCECTOMY Left 08/14/2019   Procedure: Laminectomy for facet/synovial cyst - left - Lumbar three-Lumbar four;  Surgeon: Julio Sicks, MD;  Location: [facility] OR;   Service: Neurosurgery;  Laterality: Left;   TONSILLECTOMY      HEMATOLOGY/ONCOLOGY HISTORY:  Oncology History   No history exists.    ALLERGIES:  is allergic to prednisone.  MEDICATIONS:  Current Facility-Administered Medications  Medication Dose Route Frequency Provider Last Rate Last Admin   acetaminophen (TYLENOL) tablet 650 mg  650 mg Oral Q6H PRN Andris Baumann, MD       Or   acetaminophen (TYLENOL) suppository 650 mg  650 mg Rectal Q6H PRN Andris Baumann, MD       apixaban Everlene Balls) tablet 5 mg  5 mg Oral BID Andris Baumann, MD       [START ON 08/05/2023] cefTRIAXone (ROCEPHIN) 2 g in sodium chloride 0.9 % 100 mL IVPB  2 g Intravenous Q24H Lindajo Royal V, MD       digoxin (LANOXIN) tablet 0.125 mg  0.125 mg Oral Daily Andris Baumann, MD       ondansetron [employer]) tablet 4 mg  4 mg Oral Q6H PRN Andris Baumann, MD       Or   ondansetron [facility]) injection 4 mg  4 mg Intravenous Q6H PRN Andris Baumann, MD       tamsulosin (FLOMAX) capsule 0.4 mg  0.4 mg Oral Daily Andris Baumann, MD  Current Outpatient Medications  Medication Sig Dispense Refill   apixaban (ELIQUIS) 5 MG TABS tablet Take 5 mg by mouth 2 (two) times daily.     B Complex-C-E-Zn (B COMPLEX-C-E-ZINC) tablet Take 1 tablet by mouth daily.     cholecalciferol (VITAMIN D) 1000 units tablet Take 2,000 Units by mouth daily.     colesevelam (WELCHOL) 625 MG tablet Take 1,875 mg by mouth 2 (two) times daily.      cyclobenzaprine (FLEXERIL) 10 MG tablet Take 1 tablet (10 mg total) by mouth 3 (three) times daily as needed for muscle spasms. 30 tablet 0   digoxin (LANOXIN) 0.125 MG tablet Take 0.125 mg by mouth daily. lunch     doxycycline (VIBRAMYCIN) 100 MG capsule Take 100 mg by mouth at bedtime.   2   ezetimibe (ZETIA) 10 MG tablet Take 10 mg by mouth at bedtime.      furosemide (LASIX) 20 MG tablet Take 20 mg by mouth daily. lunch     HYDROcodone-acetaminophen (NORCO/VICODIN) 5-325 MG tablet Take 1 tablet by  mouth every 4 (four) hours as needed for moderate pain ((score 4 to 6)). (Patient not taking: Reported on 11/16/2020) 30 tablet 0   metoprolol tartrate (LOPRESSOR) 50 MG tablet Take 25 mg by mouth 2 (two) times daily.     mirabegron ER (MYRBETRIQ) 50 MG TB24 tablet Take 50 mg by mouth daily. (Patient not taking: Reported on 11/16/2020)     selenium sulfide (SELSUN) 2.5 % shampoo Apply 1 application topically daily.     tamsulosin (FLOMAX) 0.4 MG CAPS capsule Take 0.4 mg by mouth.     vitamin E 1000 UNIT capsule Take 1,000 Units by mouth daily. Lunch      VITAL SIGNS: BP 119/75   Pulse (!) 103   Temp 98.7 F (37.1 C) (Oral)   Resp 15   SpO2 100%  There were no vitals filed for this visit.  Estimated body mass index is 22.14 kg/m as calculated from the following:   Height as of 05/04/23: 5\' 9"  (1.753 m).   Weight as of 05/04/23: 149 lb 14.6 oz (68 kg).  LABS: CBC:    Component Value Date/Time   WBC 10.8 (H) 08/03/2023 2358   HGB 12.2 (L) 08/03/2023 2358   HCT 35.8 (L) 08/03/2023 2358   PLT 115 (L) 08/03/2023 2358   MCV 98.6 08/03/2023 2358   NEUTROABS 9.5 (H) 08/03/2023 2358   LYMPHSABS 0.3 (L) 08/03/2023 2358   MONOABS 0.9 08/03/2023 2358   EOSABS 0.0 08/03/2023 2358   BASOSABS 0.0 08/03/2023 2358   Comprehensive Metabolic Panel:    Component Value Date/Time   NA 127 (L) 08/03/2023 2358   K 4.6 08/03/2023 2358   CL 97 (L) 08/03/2023 2358   CO2 22 08/03/2023 2358   BUN 34 (H) 08/03/2023 2358   CREATININE 1.41 (H) 08/03/2023 2358   GLUCOSE 114 (H) 08/03/2023 2358   CALCIUM 8.5 (L) 08/03/2023 2358   AST 69 (H) 08/03/2023 2358   ALT 24 08/03/2023 2358   ALKPHOS 48 08/03/2023 2358   BILITOT 2.1 (H) 08/03/2023 2358   PROT 6.3 (L) 08/03/2023 2358   ALBUMIN 3.7 08/03/2023 2358    RADIOGRAPHIC STUDIES: DG Chest Portable 1 View  Result Date: 08/04/2023 CLINICAL DATA:  Altered mental status. EXAM: PORTABLE CHEST 1 VIEW COMPARISON:  December 06, 2017 FINDINGS: Multiple sternal  wires are noted. The cardiac silhouette is mildly enlarged and unchanged in size. An artificial cardiac valve is seen. There is mild,  stable elevation of the left hemidiaphragm. There is no evidence of an acute infiltrate, pleural effusion or pneumothorax. The visualized skeletal structures are unremarkable. IMPRESSION: 1. Evidence of prior median sternotomy and artificial cardiac valve placement. 2. No acute or active cardiopulmonary disease. Electronically Signed   By: Aram Candela M.D.   On: 08/04/2023 00:58   CT HEAD WO CONTRAST ( )  Result Date: 08/04/2023 CLINICAL DATA:  altered on eliquis, eval ich EXAM: CT HEAD WITHOUT CONTRAST TECHNIQUE: Contiguous axial images were obtained from the base of the skull through the vertex without intravenous contrast. RADIATION DOSE REDUCTION: This exam was performed according to the departmental dose-optimization program which includes automated exposure control, adjustment of the mA and/or kV according to patient size and/or use of iterative reconstruction technique. COMPARISON:  CT head 05/04/2023, MRA head 05/22/2022, MRI head 06/16/2018 FINDINGS: Brain: Cerebral ventricle sizes are concordant with the degree of cerebral volume loss. Patchy and confluent areas of decreased attenuation are noted throughout the deep and periventricular white matter of the cerebral hemispheres bilaterally, compatible with chronic microvascular ischemic disease. Chronic lacunar left basal ganglia insular region infarctions. Chronic left cerebellar infarction. No evidence of large-territorial acute infarction. No parenchymal hemorrhage. No mass lesion. No extra-axial collection. No mass effect or midline shift. No hydrocephalus. Basilar cisterns are patent. Vascular: No hyperdense vessel. Atherosclerotic calcifications are present within the cavernous internal carotid arteries. Gas noted within the venous system likely due to IV placement. Skull: No acute fracture or focal lesion.  Sinuses/Orbits: Paranasal sinuses and mastoid air cells are clear. Bilateral lens replacement. Otherwise the orbits are unremarkable. Other: None. IMPRESSION: No acute intracranial abnormality. Electronically Signed   By: Tish Frederickson M.D.   On: 08/04/2023 00:44    PERFORMANCE STATUS (ECOG) : 1 - Symptomatic but completely ambulatory  Review of Systems Unless otherwise noted, a complete review of systems is negative.  Physical Exam General: NAD Pulmonary: Unlabored Extremities: no edema, no joint deformities Skin: no rashes Neurological: Weakness, alert oriented to person and place pleasantly confused  IMPRESSION: I am covering today for the inpatient palliative care service.   Patient seen in the emergency department.  He is pleasantly confused, oriented only to person and place.  Patient currently eating his breakfast.  Patient denies any symptomatic complaints at present.  He knew he was in the hospital but unclear why.  I called and spoke with patient's wife by phone.  She says that at baseline, patient lives at home but is functionally independent with all of his own care.  He is normally pleasantly confused following previous strokes.  Wife says that she has adequate help at home.  There is a daughter who lives nearby.  Discussed overall goals.  Wife is in agreement with current scope of treatment.  She feels like patient has been doing well overall prior to this hospitalization.  She is hopeful that patient will return to his previous functional baseline following treatment of his UTI.  Patient has previously completed advanced directives but these are not on file within the EMR.  Would recommend the wife bring these in to be scanned into the chart.  Wife says that she is his healthcare power of attorney.  Discussed CODE STATUS and wife says that she would like for him to remain a full code.  She is willing to reconsider in the event of significant changes, but again, she feels  like he has been doing reasonably well.  PLAN: -Continue current scope of treatment -Full code  Time Total: 45 minutes  Visit consisted of counseling and education dealing with the complex and emotionally intense issues of symptom management and palliative care in the setting of serious and potentially life-threatening illness.Greater than 50%  of this time was spent counseling and coordinating care related to the above assessment and plan.  Signed by: Laurette Schimke, PhD, NP-C

## 2023-08-04 NOTE — Progress Notes (Signed)
OT Cancellation Note  Patient Details Name: Juan [name] MRN: [id number] DOB: 1941/07/08   Cancelled Treatment:    Reason Eval/Treat Not Completed: Medical issues which prohibited therapy. Chart reviewed, pt noted to have red MEWS scoring with increased HR and RR at rest. OT will re-attempt as able when medically appropriate.   Pinchos Topel L. Demira Gwynne, OTR/L  08/04/23, 3:26 PM

## 2023-08-04 NOTE — ED Notes (Signed)
PT working with patient at bedside

## 2023-08-04 NOTE — Assessment & Plan Note (Signed)
Chronic anticoagulation  Continue Eliquis 

## 2023-08-04 NOTE — Evaluation (Signed)
Physical Therapy Evaluation Patient Details Name: Juan [name] MRN: [id number] DOB: 1941-05-08 Today's Date: 08/04/2023  History of Present Illness  Pt is an 82 y.o. male presenting to hospital 08/03/23 with c/o AMS and difficulty voiding.  Pt admitted with UTI, severe sepsis, immunosuppression secondary to long term steroids, BPH with LUTS, acute metabolic encephalopathy, AKI.  PMH includes CAD, CABG, CHF, a-fib, stroke, OSA on CPAP, mild cognitive impairment, remote lacunar infarcts, back sx, bullous pemphigoid.  Clinical Impression  Prior to hospital admission, pt was independent with functional mobility; h/o L foot drop (has brace for this but will also walk in home without it);  no recent falls reported; lives with his wife in 1 level home with 3 STE with L grab bar.  No c/o pain during session.  Currently pt is SBA with bed mobility; CGA with transfers; and CGA to ambulate 60 feet (no AD use).  Gait impairments noted--see below for details (CGA provided for safety but no loss of balance noted).  Pt's HR fluctuating between 108-120 bpm at rest and increased up to 140 bpm with activity (nurse notified; reports pt with h/o a-fib).  Pt would currently benefit from skilled PT to address noted impairments and functional limitations (see below for any additional details).  Upon hospital discharge, pt would benefit from ongoing therapy.     If plan is discharge home, recommend the following: A little help with walking and/or transfers;A little help with bathing/dressing/bathroom;Assistance with cooking/housework;Assist for transportation;Help with stairs or ramp for entrance   Can travel by private vehicle    Yes    Equipment Recommendations None recommended by PT  Recommendations for Other Services       Functional Status Assessment Patient has had a recent decline in their functional status and demonstrates the ability to make significant improvements in function in a reasonable and  predictable amount of time.     Precautions / Restrictions Precautions Precautions: Fall Restrictions Weight Bearing Restrictions: No      Mobility  Bed Mobility Overal bed mobility: Needs Assistance Bed Mobility: Supine to Sit, Sit to Supine     Supine to sit: Supervision, HOB elevated Sit to supine: Supervision, HOB elevated   General bed mobility comments: mild increased effort to perform on own    Transfers Overall transfer level: Needs assistance Equipment used: None Transfers: Sit to/from Stand Sit to Stand: Contact guard assist           General transfer comment: steady standing from ED stretcher bed    Ambulation/Gait Ambulation/Gait assistance: Contact guard assist Gait Distance (Feet): 60 Feet (in ED room) Assistive device: None Gait Pattern/deviations: Step-through pattern Gait velocity: decreased     General Gait Details: L foot drop; mild increased R lateral sway  Stairs            Wheelchair Mobility     Tilt Bed    Modified Rankin (Stroke Patients Only)       Balance Overall balance assessment: Needs assistance Sitting-balance support: No upper extremity supported, Feet supported Sitting balance-Leahy Scale: Fair Sitting balance - Comments: steady static sitting but pt leaning backwards at times so CGA provided for safety   Standing balance support: No upper extremity supported Standing balance-Leahy Scale: Good Standing balance comment: no loss of balance noted with ambulation                             Pertinent Vitals/Pain Pain Assessment Pain Assessment:  No/denies pain SpO2 sats on room air [facility] during session.    Home Living Family/patient expects to be discharged to:: Private residence Living Arrangements: Spouse/significant other Available Help at Discharge: Family;Available PRN/intermittently Type of Home: House Home Access: Stairs to enter Entrance Stairs-Rails:  (L handicap grab bar) Entrance  Stairs-Number of Steps: 3   Home Layout: One level Home Equipment: Grab bars - tub/shower;Shower seat - built in;Hand held shower head Additional Comments: Daughter lives nearby.    Prior Function Prior Level of Function : Independent/Modified Independent             Mobility Comments: H/o L foot drop (wears L ankle brace typically but will walk in house without it as well).  No recent falls reported.  Walks in mall with wife for exercise.       Extremity/Trunk Assessment   Upper Extremity Assessment Upper Extremity Assessment: Defer to OT evaluation    Lower Extremity Assessment Lower Extremity Assessment: Generalized weakness;LLE deficits/detail LLE Deficits / Details: L foot drop (AROM 2-/5)       Communication   Communication Communication: Hearing impairment Cueing Techniques: Verbal cues;Visual cues  Cognition Arousal: Alert Behavior During Therapy: [facility] for tasks assessed/performed                                   General Comments: Oriented to person, hospital, November; pt reported it was 2006 and was not sure why he was in hospital        General Comments  Nursing cleared pt for participation in physical therapy.  Pt agreeable to PT session.  Pt's wife present during session; pt's daughter left beginning of session.    Exercises     Assessment/Plan    PT Assessment Patient needs continued PT services  PT Problem List Decreased strength;Decreased balance;Decreased mobility;Decreased cognition;Cardiopulmonary status limiting activity       PT Treatment Interventions DME instruction;Gait training;Stair training;Functional mobility training;Therapeutic activities;Therapeutic exercise;Balance training;Patient/family education    PT Goals (Current goals can be found in the Care Plan section)  Acute Rehab PT Goals Patient Stated Goal: to improve strength and balance PT Goal Formulation: With patient/family Time For Goal Achievement:  08/18/23 Potential to Achieve Goals: Good    Frequency Min 1X/week     Co-evaluation               AM-PAC PT "6 Clicks" Mobility  Outcome Measure Help needed turning from your back to your side while in a flat bed without using bedrails?: None Help needed moving from lying on your back to sitting on the side of a flat bed without using bedrails?: A Little Help needed moving to and from a bed to a chair (including a wheelchair)?: A Little Help needed standing up from a chair using your arms (e.g., wheelchair or bedside chair)?: A Little Help needed to walk in hospital room?: A Little Help needed climbing 3-5 steps with a railing? : A Little 6 Click Score: 19    End of Session Equipment Utilized During Treatment: Gait belt Activity Tolerance: Patient tolerated treatment well Patient left: in bed;with call bell/phone within reach;with family/visitor present;Other (comment) (in ED stretcher bed in lowest position; B railings up) Nurse Communication: Mobility status;Precautions;Other (comment) (pt's HR during session) PT Visit Diagnosis: Other abnormalities of gait and mobility (R26.89);Muscle weakness (generalized) (M62.81)    Time: 8413-2440 PT Time Calculation (min) (ACUTE ONLY): 29 min   Charges:  PT Evaluation $PT Eval Low Complexity: 1 Low PT Treatments $Therapeutic Activity: 8-22 mins PT General Charges $$ ACUTE PT VISIT: 1 Visit        Hendricks Limes, PT 08/04/23, 12:41 PM

## 2023-08-04 NOTE — H&P (Addendum)
History and Physical    Patient: Juan [name] XBM:[id number] DOB: [birth year] DOA: 08/03/2023 DOS: the patient was seen and examined on 08/04/2023 PCP: Marguarite Arbour, MD  Patient coming from: Home  Chief Complaint:  Chief Complaint  Patient presents with   Altered Mental Status    HPI: Amdrew [name] is a 82 y.o. 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  History is given by wife at bedside who states 3 days ago patient started having difficulty voiding associated with lower abdominal discomfort.  In the past day he became confused beyond his baseline and was very lethargic, sleeping all day.   He has had no fever or chills, nausea or vomiting ED course and data review: Afebrile on arrival, tachycardic to 120 with BP initially 129/89 going as low as 98/70, improving with a small LR bolus to 102/73 by admission. Labs: WBC 10,800 with lactic acid 1.9.  Platelets 115,000(baseline 130-140,000, hemoglobin at baseline at 12.2 Creatinine 1.41, up from baseline of 1.08, with sodium 127 Urinalysis with large leukocyte esterase rare bacteria EKG, personally viewed and interpreted showing A-fib at 115 Chest x-ray nonacute Head CT nonacute.  Bladder scan was done in the ED with 600 m and Foley catheter was placed Patient treated with small fluid bolus given low EF, started on ceftriaxone Hospitalist consulted for admission     Past Medical History:  Diagnosis Date   Arthritis    back- low   Atrial fibrillation (HCC)    Cancer (HCC)    skin ( basal) cell , face, head, back, inside L ear   Coronary artery disease    Dysrhythmia    MI (myocardial infarction) (HCC)    Sleep apnea    +Cpap in use nightly , last study- 10 yrs. ago    Stroke [facility])    "light stroke"   Past Surgical History:  Procedure Laterality Date   APPENDECTOMY     BACK SURGERY  09/2011   lumbar fusion    CARDIAC CATHETERIZATION     CARDIAC SURGERY     CARDIAC VALVE REPLACEMENT     CORONARY ARTERY BYPASS GRAFT     EYE SURGERY Bilateral    catarascts removed-    HERNIA REPAIR Bilateral 1946, 1990's   inguinal x3 procedures    LUMBAR LAMINECTOMY/DECOMPRESSION MICRODISCECTOMY Left 08/14/2019   Procedure: Laminectomy for facet/synovial cyst - left - Lumbar three-Lumbar four;  Surgeon: Julio Sicks, MD;  Location: [facility] OR;  Service: Neurosurgery;  Laterality: Left;   TONSILLECTOMY     Social History:  reports that he has never smoked. He has never used smokeless tobacco. He reports that he does not drink alcohol and does not use drugs.  Allergies  Allergen Reactions   Prednisone Other (See Comments)    Developed afib after use.    No family history on file.  Prior to Admission medications   Medication Sig Start Date End Date Taking? Authorizing Provider  apixaban (ELIQUIS) 5 MG TABS tablet Take 5 mg by mouth 2 (two) times daily. 11/13/17   [provider]  B Complex-C-E-Zn (B COMPLEX-C-E-ZINC) tablet Take 1 tablet by mouth daily.    [provider]  cholecalciferol (VITAMIN D) 1000 units tablet Take 2,000 Units by mouth daily.    [provider]  colesevelam (WELCHOL) 625 MG tablet Take 1,875 mg by mouth 2 (two) times daily.  11/26/17   [provider]  cyclobenzaprine (FLEXERIL) 10 MG tablet Take 1 tablet (10 mg total) by mouth 3 (three) times daily as needed for muscle spasms. 08/14/19   Julio Sicks, MD  digoxin (LANOXIN) 0.125 MG tablet Take 0.125 mg by mouth daily. lunch 09/20/17 08/07/19  [provider]  doxycycline (VIBRAMYCIN) 100 MG capsule Take 100 mg by mouth at bedtime.  11/11/17   [provider]  ezetimibe (ZETIA) 10 MG tablet Take 10 mg by mouth at bedtime.  05/28/17   [provider]  furosemide (LASIX) 20 MG tablet Take 20 mg by mouth daily. lunch 09/18/17 08/07/19  [provider]  HYDROcodone-acetaminophen (NORCO/VICODIN) 5-325 MG tablet Take 1 tablet by mouth every 4 (four) hours as needed for moderate pain ((score 4 to 6)). Patient not taking: Reported on 11/16/2020 08/14/19   Julio Sicks, MD  metoprolol tartrate (LOPRESSOR) 50 MG tablet Take 25 mg by mouth 2 (two) times daily. 11/04/17 08/07/19  [provider]  mirabegron ER (MYRBETRIQ) 50 MG TB24 tablet Take 50 mg by mouth daily. Patient not taking: Reported on 11/16/2020 06/27/17   [provider]  selenium sulfide (SELSUN) 2.5 % shampoo Apply 1 application topically daily. 05/26/19   [provider]  tamsulosin (FLOMAX) 0.4 MG CAPS capsule Take 0.4 mg by mouth.    [provider]  vitamin E 1000 UNIT capsule Take 1,000 Units by mouth daily. Lunch    [provider]    Physical Exam: Vitals:   08/04/23 0000 08/04/23 0100 08/04/23 0130 08/04/23 0200  BP: 98/70 (!) 136/99 104/76 102/73  Pulse: 98 (!) 102 (!) 101 98  Resp: 18 16 14 20   Temp:      SpO2: 100% 100% 98% 100%   Physical Exam Vitals and nursing note reviewed.  Constitutional:      General: He is not in acute distress. HENT:     Head: Normocephalic and atraumatic.  Cardiovascular:     Rate and Rhythm: Regular rhythm. Tachycardia present.     Heart sounds: Normal heart sounds.  Pulmonary:     Effort: Pulmonary effort is normal.     Breath sounds: Normal breath sounds.  Abdominal:     Palpations: Abdomen is soft.     Tenderness: There is no abdominal tenderness.  Neurological:     General: No focal deficit present.     Mental Status: Mental status is at baseline.     Labs on Admission: I have personally reviewed following labs and imaging studies  CBC: Recent Labs  Lab 08/03/23 2358  WBC 10.8*  NEUTROABS 9.5*  HGB 12.2*  HCT 35.8*  MCV 98.6  PLT 115*   Basic  Metabolic Panel: Recent Labs  Lab 08/03/23 2358  NA 127*  K 4.6  CL 97*  CO2 22  GLUCOSE 114*  BUN 34*  CREATININE 1.41*  CALCIUM 8.5*   GFR: CrCl cannot be calculated (Unknown ideal weight.). Liver Function Tests: Recent Labs  Lab 08/03/23 2358  AST 69*  ALT 24  ALKPHOS 48  BILITOT 2.1*  PROT 6.3*  ALBUMIN 3.7   No results for input(s): "LIPASE", "AMYLASE" in the last 168 hours. No results for input(s): "AMMONIA" in the last 168 hours. Coagulation Profile: No results for input(s): "INR", "PROTIME" in the last 168 hours. Cardiac Enzymes: No results for input(s): "CKTOTAL", "CKMB", "CKMBINDEX", "TROPONINI" in the last 168 hours. BNP (  last 3 results) No results for input(s): "PROBNP" in the last 8760 hours. HbA1C: No results for input(s): "HGBA1C" in the last 72 hours. CBG: No results for input(s): "GLUCAP" in the last 168 hours. Lipid Profile: No results for input(s): "CHOL", "HDL", "LDLCALC", "TRIG", "CHOLHDL", "LDLDIRECT" in the last 72 hours. Thyroid Function Tests: No results for input(s): "TSH", "T4TOTAL", "FREET4", "T3FREE", "THYROIDAB" in the last 72 hours. Anemia Panel: No results for input(s): "VITAMINB12", "FOLATE", "FERRITIN", "TIBC", "IRON", "RETICCTPCT" in the last 72 hours. Urine analysis:    Component Value Date/Time   COLORURINE AMBER (A) 08/03/2023 2358   APPEARANCEUR TURBID (A) 08/03/2023 2358   LABSPEC 1.012 08/03/2023 2358   PHURINE 8.0 08/03/2023 2358   GLUCOSEU NEGATIVE 08/03/2023 2358   HGBUR MODERATE (A) 08/03/2023 2358   BILIRUBINUR NEGATIVE 08/03/2023 2358   KETONESUR 5 (A) 08/03/2023 2358   PROTEINUR 100 (A) 08/03/2023 2358   NITRITE NEGATIVE 08/03/2023 2358   LEUKOCYTESUR LARGE (A) 08/03/2023 2358    Radiological Exams on Admission: DG Chest Portable 1 View  Result Date: 08/04/2023 CLINICAL DATA:  Altered mental status. EXAM: PORTABLE CHEST 1 VIEW COMPARISON:  December 06, 2017 FINDINGS: Multiple sternal wires are noted. The  cardiac silhouette is mildly enlarged and unchanged in size. An artificial cardiac valve is seen. There is mild, stable elevation of the left hemidiaphragm. There is no evidence of an acute infiltrate, pleural effusion or pneumothorax. The visualized skeletal structures are unremarkable. IMPRESSION: 1. Evidence of prior median sternotomy and artificial cardiac valve placement. 2. No acute or active cardiopulmonary disease. Electronically Signed   By: Aram Candela M.D.   On: 08/04/2023 00:58   CT HEAD WO CONTRAST ( )  Result Date: 08/04/2023 CLINICAL DATA:  altered on eliquis, eval ich EXAM: CT HEAD WITHOUT CONTRAST TECHNIQUE: Contiguous axial images were obtained from the base of the skull through the vertex without intravenous contrast. RADIATION DOSE REDUCTION: This exam was performed according to the departmental dose-optimization program which includes automated exposure control, adjustment of the mA and/or kV according to patient size and/or use of iterative reconstruction technique. COMPARISON:  CT head 05/04/2023, MRA head 05/22/2022, MRI head 06/16/2018 FINDINGS: Brain: Cerebral ventricle sizes are concordant with the degree of cerebral volume loss. Patchy and confluent areas of decreased attenuation are noted throughout the deep and periventricular white matter of the cerebral hemispheres bilaterally, compatible with chronic microvascular ischemic disease. Chronic lacunar left basal ganglia insular region infarctions. Chronic left cerebellar infarction. No evidence of large-territorial acute infarction. No parenchymal hemorrhage. No mass lesion. No extra-axial collection. No mass effect or midline shift. No hydrocephalus. Basilar cisterns are patent. Vascular: No hyperdense vessel. Atherosclerotic calcifications are present within the cavernous internal carotid arteries. Gas noted within the venous system likely due to IV placement. Skull: No acute fracture or focal lesion. Sinuses/Orbits:  Paranasal sinuses and mastoid air cells are clear. Bilateral lens replacement. Otherwise the orbits are unremarkable. Other: None. IMPRESSION: No acute intracranial abnormality. Electronically Signed   By: Tish Frederickson M.D.   On: 08/04/2023 00:44     Data Reviewed: Relevant notes from primary care and specialist visits, past discharge summaries as available in EHR, including Care Everywhere. Prior diagnostic testing as pertinent to current admission diagnoses Updated medications and problem lists for reconciliation ED course, including vitals, labs, imaging, treatment and response to treatment Triage notes, nursing and pharmacy notes and ED provider's notes Notable results as noted in HPI   Assessment and Plan: Urinary tract infection Severe sepsis Immunosuppression secondary to long-term  steroids Sepsis criteria include tachycardia, tachypnea, hypotension, leukocytosis and UTI, organ dysfunction with AKI, metabolic encephalopathy and hypotension Cautious sepsis fluid resuscitation given HFrEF with EF 25% Continue Rocephin Follow cultures Will hold prednisone 10 mg in the setting of severe sepsis  Acute urinary retention BPH with LUTS Bladder scan revealed 600 mL urine Continue Foley Continue home tamsulosin and Myrbetriq Voiding trial per protocol  Acute metabolic encephalopathy Mild cognitive impairment Delirium precautions  AKI (acute kidney injury) (HCC) Secondary to ATN from sepsis Creatinine 1.41 up from baseline of 1.08 Expecting improvement with hydration Monitor renal function and avoid nephrotoxins  Coronary artery disease with history of CABG No complaints of chest pain and EKG nonacute Holding metoprolol due to soft blood pressure, to resume as appropriate  HFrEF (heart failure with reduced ejection fraction) (HCC) History of mitral valve repair Clinically euvolemic to dry Due to soft blood pressure will hold metoprolol, furosemide until able to  resume Continue digoxin  Atrial fibrillation (HCC) Chronic anticoagulation Continue Eliquis  BP (bullous pemphigoid) Long-term steroids Not currently active.  Followed by [facility] dermatology, last seen 05/2023 On long-term prednisone, methotrexate and folic acid Most recent flare was 2 months prior when prednisone was uptitrated from 5 mg to 10 mg(per wife)  Chronic anemia Hemoglobin at baseline at 12.2  Thrombocytopenia (HCC) Chronic thrombocytopenia.  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 Inpatient status is judged to be reasonable and necessary in order to provide the required intensity of service to ensure the patient's safety. The patient's presenting symptoms, physical exam findings, and initial radiographic and laboratory data in the context of their chronic comorbidities is felt to place them at high risk for further clinical deterioration. Furthermore, it is not anticipated that the patient will be medically stable for discharge from the hospital within 2 midnights of admission.   * I certify that at the point of admission it is my clinical judgment that the patient will require inpatient hospital care spanning beyond 2 midnights from the point of admission due to high intensity of service, high risk for further deterioration and high frequency of surveillance required.*  Author: Andris Baumann, MD 08/04/2023 2:40 AM  For on call review [email or web address].

## 2023-08-04 NOTE — Assessment & Plan Note (Addendum)
History of mitral valve repair Clinically euvolemic to dry Due to soft blood pressure will hold metoprolol, furosemide until able to resume Continue digoxin

## 2023-08-04 NOTE — Assessment & Plan Note (Signed)
Secondary to ATN from sepsis Creatinine 1.41 up from baseline of 1.08 Expecting improvement with hydration Monitor renal function and avoid nephrotoxins

## 2023-08-04 NOTE — Assessment & Plan Note (Signed)
Continue Eliquis, ezetimibe, WelChol

## 2023-08-04 NOTE — Assessment & Plan Note (Signed)
Hemoglobin at baseline at 12.2

## 2023-08-04 NOTE — ED Notes (Signed)
This RN notified Dr. Lucianne Muss of patient's increased HR. Patient's HR has been anywhere from 110-150, with 166 bpm being the highest.

## 2023-08-04 NOTE — Assessment & Plan Note (Signed)
No complaints of chest pain and EKG nonacute Holding metoprolol due to soft blood pressure, to resume as appropriate

## 2023-08-04 NOTE — ED Notes (Signed)
Patient sitting comfortably eating breakfast.

## 2023-08-04 NOTE — Assessment & Plan Note (Addendum)
Mild cognitive impairment Delirium precautions

## 2023-08-04 NOTE — Assessment & Plan Note (Deleted)
On long-term prednisone, methotrexate and folic acid Most recent flare was 2 months prior when prednisone was uptitrated from 5 mg to 10 mg

## 2023-08-04 NOTE — ED Notes (Signed)
Admitting provider at patient bedside.

## 2023-08-04 NOTE — Assessment & Plan Note (Addendum)
Long-term steroids Not currently active.  Followed by [facility] dermatology, last seen 05/2023 On long-term prednisone, methotrexate and folic acid Most recent flare was 2 months prior when prednisone was uptitrated from 5 mg to 10 mg(per wife)

## 2023-08-04 NOTE — Assessment & Plan Note (Signed)
BPH with LUTS Bladder scan revealed 600 mL urine Continue Foley Continue home tamsulosin and Myrbetriq Voiding trial per protocol

## 2023-08-04 NOTE — Assessment & Plan Note (Signed)
CPAP nightly

## 2023-08-05 ENCOUNTER — Inpatient Hospital Stay (HOSPITAL_COMMUNITY)
Admit: 2023-08-05 | Discharge: 2023-08-05 | Disposition: A | Discharge status: Home / Self Care | Payer: PPO | Attending: Student | Admitting: Student

## 2023-08-05 DIAGNOSIS — I4891 Unspecified atrial fibrillation: Secondary | ICD-10-CM

## 2023-08-05 DIAGNOSIS — R6520 Severe sepsis without septic shock: Secondary | ICD-10-CM | POA: Diagnosis not present

## 2023-08-05 DIAGNOSIS — A419 Sepsis, unspecified organism: Secondary | ICD-10-CM | POA: Diagnosis not present

## 2023-08-05 LAB — CBC
HCT: 31.2 % — ABNORMAL LOW (ref 39.0–52.0)
Hemoglobin: 10.5 g/dL — ABNORMAL LOW (ref 13.0–17.0)
MCH: 33.5 pg (ref 26.0–34.0)
MCHC: 33.7 g/dL (ref 30.0–36.0)
MCV: 99.7 fL (ref 80.0–100.0)
Platelets: 104 10*3/uL — ABNORMAL LOW (ref 150–400)
RBC: 3.13 MIL/uL — ABNORMAL LOW (ref 4.22–5.81)
RDW: 14 % (ref 11.5–15.5)
WBC: 6.4 10*3/uL (ref 4.0–10.5)
nRBC: 0 % (ref 0.0–0.2)

## 2023-08-05 LAB — ECHOCARDIOGRAM COMPLETE
AR max vel: 3.04 cm2
AV Area VTI: 3.52 cm2
AV Area mean vel: 2.76 cm2
AV Mean grad: 5.7 mm[Hg]
AV Peak grad: 9.3 mm[Hg]
Ao pk vel: 1.53 m/s
Area-P 1/2: 3.31 cm2
MV VTI: 3.37 cm2
S' Lateral: 5.4 cm

## 2023-08-05 LAB — URINE CULTURE: Culture: NO GROWTH

## 2023-08-05 LAB — BASIC METABOLIC PANEL
Anion gap: 6 (ref 5–15)
BUN: 22 mg/dL (ref 8–23)
CO2: 25 mmol/L (ref 22–32)
Calcium: 8 mg/dL — ABNORMAL LOW (ref 8.9–10.3)
Chloride: 100 mmol/L (ref 98–111)
Creatinine, Ser: 1.03 mg/dL (ref 0.61–1.24)
GFR, Estimated: >60 mL/min (ref >60)
Glucose, Bld: 132 mg/dL — ABNORMAL HIGH (ref 70–99)
Potassium: 4.5 mmol/L (ref 3.5–5.1)
Sodium: 131 mmol/L — ABNORMAL LOW (ref 135–145)

## 2023-08-05 LAB — HEPATIC FUNCTION PANEL
ALT: 22 U/L (ref 0–44)
AST: 62 U/L — ABNORMAL HIGH (ref 15–41)
Albumin: 3 g/dL — ABNORMAL LOW (ref 3.5–5.0)
Alkaline Phosphatase: 42 U/L (ref 38–126)
Bilirubin, Direct: 0.2 mg/dL (ref 0.0–0.2)
Indirect Bilirubin: 0.5 mg/dL (ref 0.3–0.9)
Total Bilirubin: 0.7 mg/dL (ref <1.2)
Total Protein: 5.4 g/dL — ABNORMAL LOW (ref 6.5–8.1)

## 2023-08-05 LAB — PHOSPHORUS: Phosphorus: 2.9 mg/dL (ref 2.5–4.6)

## 2023-08-05 LAB — DIGOXIN LEVEL: Digoxin Level: 0.5 ng/mL — ABNORMAL LOW (ref 0.8–2.0)

## 2023-08-05 LAB — MAGNESIUM: Magnesium: 2 mg/dL (ref 1.7–2.4)

## 2023-08-05 MED ORDER — SODIUM CHLORIDE 0.9 % IV SOLN: INTRAVENOUS | Status: AC

## 2023-08-05 MED ORDER — LACTATED RINGERS IV BOLUS
500.0000 mL | Freq: Once | INTRAVENOUS | Status: AC
  Administered 2023-08-05: 500 mL via INTRAVENOUS

## 2023-08-05 MED ORDER — MIDODRINE HCL 5 MG PO TABS
10.0000 mg | ORAL_TABLET | Freq: Three times a day (TID) | ORAL | Status: DC
  Administered 2023-08-05 (×3): 10 mg via ORAL
  Filled 2023-08-05 (×3): qty 2

## 2023-08-05 MED ORDER — BISACODYL 5 MG PO TBEC
10.0000 mg | DELAYED_RELEASE_TABLET | Freq: Every day | ORAL | Status: DC
  Administered 2023-08-05: 10 mg via ORAL
  Filled 2023-08-05: qty 2

## 2023-08-05 MED ORDER — POLYETHYLENE GLYCOL 3350 17 G PO PACK
17.0000 g | PACK | Freq: Every day | ORAL | Status: DC
  Administered 2023-08-05 – 2023-08-07 (×3): 17 g via ORAL
  Filled 2023-08-05 (×3): qty 1

## 2023-08-05 MED ORDER — CHLORHEXIDINE GLUCONATE CLOTH 2 % EX PADS
6.0000 | MEDICATED_PAD | Freq: Every day | CUTANEOUS | Status: DC
  Administered 2023-08-05 – 2023-08-06 (×2): 6 via TOPICAL

## 2023-08-05 MED ORDER — BISACODYL 10 MG RE SUPP: 10.0000 mg | Freq: Every day | RECTAL | Status: DC | PRN

## 2023-08-05 MED ORDER — METOPROLOL TARTRATE 5 MG/5ML IV SOLN
5.0000 mg | Freq: Once | INTRAVENOUS | Status: AC
  Administered 2023-08-05: 5 mg via INTRAVENOUS
  Filled 2023-08-05: qty 5

## 2023-08-05 MED ORDER — EZETIMIBE 10 MG PO TABS
10.0000 mg | ORAL_TABLET | Freq: Every day | ORAL | Status: DC
  Administered 2023-08-05 – 2023-08-06 (×2): 10 mg via ORAL
  Filled 2023-08-05 (×2): qty 1

## 2023-08-05 NOTE — ED Notes (Signed)
Called [facility] to give report. They said they do not know if they are taking this pt due to equity and are waiting to hear back from the [facility].

## 2023-08-05 NOTE — Progress Notes (Signed)
[facility] Progress Note  Patient: Juan [name]    ZOX:[id number]  DOA: 08/03/2023     Date of Service: the patient was seen and examined on 08/05/2023  Chief Complaint  Patient presents with   Altered Mental Status   Brief hospital course: Juan [name] is a 82 y.o. 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  History is given by wife at bedside who states 3 days ago patient started having difficulty voiding associated with lower abdominal discomfort.  In the past day he became confused beyond his baseline and was very lethargic, sleeping all day.   He has had no fever or chills, nausea or vomiting ED course and data review: Afebrile on arrival, tachycardic to 120 with BP initially 129/89 going as low as 98/70, improving with a small LR bolus to 102/73 by admission. Labs: WBC 10,800 with lactic acid 1.9.  Platelets 115,000(baseline 130-140,000, hemoglobin at baseline at 12.2 Creatinine 1.41, up from baseline of 1.08, with sodium 127 Urinalysis with large leukocyte esterase rare bacteria EKG, personally viewed and interpreted showing A-fib at 115 Chest x-ray nonacute Head CT nonacute.   Bladder scan was done in the ED with 600 m and Foley catheter was placed Patient treated with small fluid bolus given low EF, started on ceftriaxone Hospitalist consulted for admission    Assessment and Plan:  Atrial fibrillation with RVR Chronic anticoagulation, Continue Eliquis 11/10 patient developed A-fib with RVR, resumed Lopressor 25 mg p.o. twice daily,  Cardizem 10 mg IV one-time dose given and Lopressor 5 mg IV x 2 doses given Cardiology consulted, patient may need cardioversion Monitor on telemetry   Urinary tract  infection Severe sepsis Immunocompromised on methotrexate Sepsis criteria include tachycardia, tachypnea, hypotension, leukocytosis and UTI, organ dysfunction with AKI, metabolic encephalopathy and hypotension Cautious sepsis fluid resuscitation given HFrEF with EF 25% Continue Rocephin, Abx for 7 days due to risk of UTI 2/2 foley catheter  Blood culture NGTD, urine culture no growth    Acute urinary retention BPH with LUTS Bladder scan revealed 600 mL urine Continue Foley for 7 days Continue home tamsulosin and Myrbetriq Option was given to patient to DC Foley tomorrow morning for voiding trial if he fails then we will reinsert Foley and discharge him to follow-up with urology as an outpatient.  Patient will think about it, we will discuss again regarding voiding trial in the morning    Acute metabolic encephalopathy, Resolved  Mild cognitive impairment Delirium precautions   AKI (acute kidney injury) (HCC) Secondary to ATN from sepsis Creatinine 1.41 up from baseline of 1.08 Cr 1.03 AKI resolved s/p IVF Monitor renal function and avoid nephrotoxins   Coronary artery disease with history of CABG No complaints of chest pain and EKG nonacute Beta-blocker was held due to soft blood pressure 11/10 resumed Lopressor 25 mg p.o. twice daily due to A-fib with RVR 11/11 started midodrine 10 mg p.o. 3 times daily due to low blood pressure   HFrEF (heart failure with reduced ejection fraction) (HCC) History of mitral valve repair Clinically euvolemic to dry Due to soft blood pressure will hold metoprolol, furosemide until able to resume Continue digoxin 11/11 started midodrine 10 mg p.o. 3 times daily and Lopressor 25 mg p.o. twice daily   BP (bullous pemphigoid) Long-term steroids  Not currently active.  Followed by [facility] dermatology, last seen 05/2023 Pt was on methotrexate and folic acid Most recent flare was 2 months prior when prednisone was uptitrated from 5 mg to 10 mg(per  wife) Currently patient is not on prednisone  Chronic anemia Hemoglobin at baseline at 12.2 Hb 10.5, could be dilutional low  Thrombocytopenia  Chronic thrombocytopenia.  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  Interventions:  Diet: Heart healthy diet DVT Prophylaxis: Therapeutic Anticoagulation with Eliquis    Advance goals of care discussion: Full code  Family Communication: family was present at bedside, at the time of interview.  The pt provided permission to discuss medical plan with the family. Opportunity was given to ask question and all questions were answered satisfactorily.   Disposition:  Pt is from Home, admitted with AMS, Sepsis,UTI, Urine retention, Foley was inserted, on IV antibiotics, developed A-fib with RVR, cardiology consulted, may need cardioversion, which precludes a safe discharge. Discharge to Home, when stable and cleared by cardiology.  Subjective: No significant events overnight except A-fib with RVR, patient denies any chest pain or palpitations.  Denied any abdominal pain, no nausea vomiting.  No any other complaints.  Physical Exam: General: NAD, lying comfortably Appear in no distress, affect appropriate Eyes: PERRLA ENT: Oral Mucosa Clear, moist  Neck: no JVD,  Cardiovascular: Irregular rhythm, A-fib with RVR, no Murmur,  Respiratory: good respiratory effort, Bilateral Air entry equal and Decreased, no Crackles, no wheezes Abdomen: Bowel Sound present, Soft and no tenderness,  Skin: no rashes Extremities: no Pedal edema, no calf tenderness Neurologic: without any new focal findings Gait not checked due to patient safety concerns  Vitals:   08/05/23 1000 08/05/23 1030 08/05/23 1116 08/05/23 1443  BP: (!) 95/59 97/69 96/69  112/89  Pulse:   89   Resp: 18     Temp:       TempSrc:      SpO2: 100% 100% 94% 94%    Intake/Output Summary (Last 24 hours) at 08/05/2023 1530 Last data filed at 08/05/2023 3244 Gross per 24 hour  Intake 600 ml  Output 1400 ml  Net -800 ml   There were no vitals filed for this visit.  Data Reviewed: I have personally reviewed and interpreted daily labs, tele strips, imagings as discussed above. I reviewed all nursing notes, pharmacy notes, vitals, pertinent old records I have discussed plan of care as described above with RN and patient/family.  CBC: Recent Labs  Lab 08/03/23 2358 08/04/23 0921 08/05/23 0128  WBC 10.8* 7.4 6.4  NEUTROABS 9.5*  --   --   HGB 12.2* 11.2* 10.5*  HCT 35.8* 32.6* 31.2*  MCV 98.6 96.7 99.7  PLT 115* 111* 104*   Basic Metabolic Panel: Recent Labs  Lab 08/03/23 2358 08/04/23 0921 08/05/23 0128  NA 127* 131* 131*  K 4.6 3.8 4.5  CL 97* 99 100  CO2 22 24 25   GLUCOSE 114* 132* 132*  BUN 34* 24* 22  CREATININE 1.41* 1.03 1.03  CALCIUM 8.5* 8.4* 8.0*  MG  --  1.9 2.0  PHOS  --  2.7 2.9    Studies: No results found.  Scheduled Meds:  apixaban  5 mg Oral BID   Chlorhexidine Gluconate Cloth  6 each Topical Daily   digoxin  0.125 mg Oral Daily   ezetimibe  10 mg Oral QHS   magnesium oxide  400 mg Oral BID   metoprolol tartrate  25 mg Oral BID   midodrine  10 mg Oral TID WC   tamsulosin  0.4 mg Oral Daily   Continuous Infusions:  cefTRIAXone (ROCEPHIN)  IV Stopped (08/05/23 0124)   PRN Meds: acetaminophen **OR** acetaminophen, ondansetron **OR** ondansetron (ZOFRAN) IV  Time spent: 55 minutes  Author: Gillis Santa. MD [facility] 08/05/2023 3:30 PM  To reach On-call, see care teams to locate the attending and reach out to them via [email or web address]. If 7PM-7AM, please contact night-coverage If you still have difficulty reaching the attending provider, please page the [facility] (Director on Call) for [facility] on amion for assistance.

## 2023-08-05 NOTE — Progress Notes (Signed)
HR has been afib rvr throughout shift.  Plan for cardioversion tomorrow.

## 2023-08-05 NOTE — ED Notes (Signed)
Moduo, Hospitalist was informed of pt, fast and irregular heart rate

## 2023-08-05 NOTE — Progress Notes (Signed)
       CROSS COVER NOTE  NAME: Juan [name] MRN: [id number] DOB : 1940-10-11    Date of Service   08/05/2023   HPI/Events of Note   Continues tachycardia reported by care nurse.  Chart review showed that patient has been tachycardic most of the day but rate was less than 110.  Patient received a dose of diltiazem and metoprolol during the day.  Patient supposed to be on metoprolol orally 50 mg twice a day, but it was held due to blood pressure being soft.  Interventions   Plan: Ordered ECG and LR 500 ml bolus and patient given 5 mg of IV metoprolol. Rate is controlled at this time.  Consider restarting home dosing of metoprolol.       Evertte Sones Lamin Geradine Girt, MSN, APRN, AGACNP-BC [facility] [place] Pager: [phone]. Check Amion for Availability

## 2023-08-05 NOTE — Consult Note (Signed)
[facility] CLINIC CARDIOLOGY CONSULT NOTE       [name] MRN: [id number] DOB/AGE: Sep 15, 1941 82 y.o.  Admit date: 08/03/2023 Referring Physician Dr. Gillis Santa Primary Physician Sparks, Duane Lope, MD  Primary Cardiologist Dr. Juliann Pares Reason for Consultation atrial fibrillation RVR  HPI: Juan [name] is a 82 y.o. male  with a past medical history of ischemic cardiomyopathy (EF 20 to 30%), paroxysmal atrial fibrillation on Eliquis, mitral regurgitation s/p mitral valve repair, CAD s/p CABG x1 in 2001 (SVG to RPDA), hypertension, hyperlipidemia, OSA who presented to the ED on 08/03/2023 for altered mental status, concern for UTI. Noted to be in AF RVR during admission. Cardiology was consulted for further evaluation.   Portions of the history are provided by the patient's wife.  She states that over the weekend the patient seemed a little bit more confused than usual.  Has a history of UTIs and usually becomes altered with these that she was concerned he had an infection.  He was brought to the ED for further evaluation.  Lab work in the ED notable for creatinine 1.41 (baseline 1.1), potassium 4.6, sodium 127, hemoglobin 12.2.  WBC 10.8, lactic acid 1.9.  UA with rare bacteria and large leukocytes thus the patient was started on IV antibiotics.  He has chronic atrial fibrillation and rate has been elevated at times throughout admission, as high as 140s earlier today.  Blood pressure has been borderline low making rate control difficult.  At the time of my evaluation this afternoon the patient is resting comfortably in hospital bed with wife present at bedside.  He denies any chest pain, shortness of breath, palpitations.  States that he cannot tell that he is in atrial fibrillation or that his heart rate is elevated.  He denies any recent fever or chills, dysuria, back pain.  Overall he states that he has been doing well and denies any recent problems.  Denies any lower  extremity edema.  Review of systems complete and found to be negative unless listed above    Past Medical History:  Diagnosis Date   Arthritis    back- low   Atrial fibrillation (HCC)    Cancer (HCC)    skin ( basal) cell , face, head, back, inside L ear   Coronary artery disease    Dysrhythmia    MI (myocardial infarction) (HCC)    Sleep apnea    +Cpap in use nightly , last study- 10 yrs. ago   Stroke [facility])    "light stroke"    Past Surgical History:  Procedure Laterality Date   APPENDECTOMY     BACK SURGERY  09/2011   lumbar fusion    CARDIAC CATHETERIZATION     CARDIAC SURGERY     CARDIAC VALVE REPLACEMENT     CORONARY ARTERY BYPASS GRAFT     EYE SURGERY Bilateral    catarascts removed-    HERNIA REPAIR Bilateral 1946, 1990's   inguinal x3 procedures    LUMBAR LAMINECTOMY/DECOMPRESSION MICRODISCECTOMY Left 08/14/2019   Procedure: Laminectomy for facet/synovial cyst - left - Lumbar three-Lumbar four;  Surgeon: Julio Sicks, MD;  Location: [facility] OR;  Service: Neurosurgery;  Laterality: Left;   TONSILLECTOMY      Medications Prior to Admission  Medication Sig Dispense Refill Last Dose   apixaban (ELIQUIS) 5 MG TABS tablet Take 5 mg by mouth 2 (two) times daily.   08/03/2023 at supper   B Complex-C-E-Zn (B COMPLEX-C-E-ZINC) tablet Take 1 tablet by mouth daily.  Past Week   cholecalciferol (VITAMIN D) 1000 units tablet Take 2,000 Units by mouth daily.   08/03/2023   colesevelam (WELCHOL) 625 MG tablet Take 1,875 mg by mouth 2 (two) times daily.    08/03/2023   digoxin (LANOXIN) 0.125 MG tablet Take 0.125 mg by mouth daily. lunch   08/03/2023 at lunch   doxycycline (VIBRAMYCIN) 100 MG capsule Take 100 mg by mouth at bedtime.   2 08/03/2023   ezetimibe (ZETIA) 10 MG tablet Take 10 mg by mouth at bedtime.    08/03/2023   folic acid (FOLVITE) 1 MG tablet Take 5 mg by mouth daily.   08/03/2023   MAGNESIUM GLUCONATE PO Take 350 mg by mouth daily. Take 115mg  tablet daily.       methotrexate (RHEUMATREX) 2.5 MG tablet Take 10 mg by mouth once a week.   08/03/2023   mirabegron ER (MYRBETRIQ) 50 MG TB24 tablet Take 50 mg by mouth daily.   08/03/2023   oxybutynin (DITROPAN) 5 MG tablet Take 1 tablet by mouth 2 (two) times daily.   08/03/2023   Potassium 99 MG TABS Take 99 mg by mouth daily.      tamsulosin (FLOMAX) 0.4 MG CAPS capsule Take 0.4 mg by mouth.   08/03/2023   vitamin E 1000 UNIT capsule Take 1,000 Units by mouth daily. Lunch   08/03/2023   furosemide (LASIX) 20 MG tablet Take 20 mg by mouth daily. lunch      metoprolol tartrate (LOPRESSOR) 50 MG tablet Take 25 mg by mouth 2 (two) times daily.      selenium sulfide (SELSUN) 2.5 % shampoo Apply 1 application topically daily.   PRN at PRN   Social History   Socioeconomic History   Marital status: Married    Spouse name: Not on file   Number of children: Not on file   Years of education: Not on file   Highest education level: Not on file  Occupational History   Not on file  Tobacco Use   Smoking status: Never   Smokeless tobacco: Never  Substance and Sexual Activity   Alcohol use: No   Drug use: No   Sexual activity: Not on file  Other Topics Concern   Not on file  Social History Narrative   Not on file   Social Determinants of Health   Financial Resource Strain: Low Risk  (05/30/2023)   Received from [facility]   Overall Financial Resource Strain (CARDIA)    Difficulty of Paying Living Expenses: Not hard at all  Food Insecurity: Not on file (08/05/2023)  Transportation Needs: Not on file (08/05/2023)  Physical Activity: Not on file  Stress: Not on file  Social Connections: Not on file  Intimate Partner Violence: Unknown (08/05/2023)   Humiliation, Afraid, Rape, and Kick questionnaire    Fear of Current or Ex-Partner: Patient unable to answer    Emotionally Abused: Not on file    Physically Abused: Not on file    Sexually Abused: Not on file    No family history on file.    Vitals:   08/05/23 1000 08/05/23 1030 08/05/23 1116 08/05/23 1443  BP: (!) 95/59 97/69 96/69  112/89  Pulse:   89   Resp: 18     Temp:      TempSrc:      SpO2: 100% 100% 94% 94%    PHYSICAL EXAM General: Well appearing, well nourished, in no acute distress sitting upright in hospital bed with wife present at bedside.  HEENT: Normocephalic and atraumatic. Neck: No JVD.  Lungs: Normal respiratory effort on room air. Clear bilaterally to auscultation. No wheezes, crackles, rhonchi.  Heart: Irregularly irregular, fast rate. Normal S1 and S2 without gallops or murmurs.  Abdomen: Non-distended appearing.  Msk: Normal strength and tone for age. Extremities: Warm and well perfused. No clubbing, cyanosis. No edema.  Neuro: Alert and oriented X 3. Psych: Answers questions appropriately.   Labs: Basic Metabolic Panel: Recent Labs    08/04/23 0921 08/05/23 0128  NA 131* 131*  K 3.8 4.5  CL 99 100  CO2 24 25  GLUCOSE 132* 132*  BUN 24* 22  CREATININE 1.03 1.03  CALCIUM 8.4* 8.0*  MG 1.9 2.0  PHOS 2.7 2.9   Liver Function Tests: Recent Labs    08/04/23 0921 08/05/23 0128  AST 72* 62*  ALT 24 22  ALKPHOS 41 42  BILITOT 1.2* 0.7  PROT 5.6* 5.4*  ALBUMIN 3.2* 3.0*   No results for input(s): "LIPASE", "AMYLASE" in the last 72 hours. CBC: Recent Labs    08/03/23 2358 08/04/23 0921 08/05/23 0128  WBC 10.8* 7.4 6.4  NEUTROABS 9.5*  --   --   HGB 12.2* 11.2* 10.5*  HCT 35.8* 32.6* 31.2*  MCV 98.6 96.7 99.7  PLT 115* 111* 104*   Cardiac Enzymes: No results for input(s): "CKTOTAL", "CKMB", "CKMBINDEX", "TROPONINIHS" in the last 72 hours. BNP: No results for input(s): "BNP" in the last 72 hours. D-Dimer: No results for input(s): "DDIMER" in the last 72 hours. Hemoglobin A1C: No results for input(s): "HGBA1C" in the last 72 hours. Fasting Lipid Panel: No results for input(s): "CHOL", "HDL", "LDLCALC", "TRIG", "CHOLHDL", "LDLDIRECT" in the last 72 hours. Thyroid  Function Tests: No results for input(s): "TSH", "T4TOTAL", "T3FREE", "THYROIDAB" in the last 72 hours.  Invalid input(s): "FREET3" Anemia Panel: No results for input(s): "VITAMINB12", "FOLATE", "FERRITIN", "TIBC", "IRON", "RETICCTPCT" in the last 72 hours.   Radiology: DG Chest Portable 1 View  Result Date: 08/04/2023 CLINICAL DATA:  Altered mental status. EXAM: PORTABLE CHEST 1 VIEW COMPARISON:  December 06, 2017 FINDINGS: Multiple sternal wires are noted. The cardiac silhouette is mildly enlarged and unchanged in size. An artificial cardiac valve is seen. There is mild, stable elevation of the left hemidiaphragm. There is no evidence of an acute infiltrate, pleural effusion or pneumothorax. The visualized skeletal structures are unremarkable. IMPRESSION: 1. Evidence of prior median sternotomy and artificial cardiac valve placement. 2. No acute or active cardiopulmonary disease. Electronically Signed   By: Aram Candela M.D.   On: 08/04/2023 00:58   CT HEAD WO CONTRAST ( )  Result Date: 08/04/2023 CLINICAL DATA:  altered on eliquis, eval ich EXAM: CT HEAD WITHOUT CONTRAST TECHNIQUE: Contiguous axial images were obtained from the base of the skull through the vertex without intravenous contrast. RADIATION DOSE REDUCTION: This exam was performed according to the departmental dose-optimization program which includes automated exposure control, adjustment of the mA and/or kV according to patient size and/or use of iterative reconstruction technique. COMPARISON:  CT head 05/04/2023, MRA head 05/22/2022, MRI head 06/16/2018 FINDINGS: Brain: Cerebral ventricle sizes are concordant with the degree of cerebral volume loss. Patchy and confluent areas of decreased attenuation are noted throughout the deep and periventricular white matter of the cerebral hemispheres bilaterally, compatible with chronic microvascular ischemic disease. Chronic lacunar left basal ganglia insular region infarctions. Chronic left  cerebellar infarction. No evidence of large-territorial acute infarction. No parenchymal hemorrhage. No mass lesion. No extra-axial collection. No mass effect or midline  shift. No hydrocephalus. Basilar cisterns are patent. Vascular: No hyperdense vessel. Atherosclerotic calcifications are present within the cavernous internal carotid arteries. Gas noted within the venous system likely due to IV placement. Skull: No acute fracture or focal lesion. Sinuses/Orbits: Paranasal sinuses and mastoid air cells are clear. Bilateral lens replacement. Otherwise the orbits are unremarkable. Other: None. IMPRESSION: No acute intracranial abnormality. Electronically Signed   By: Tish Frederickson M.D.   On: 08/04/2023 00:44    ECHO pending  TELEMETRY reviewed by me 08/05/2023: atrial fibrillation rate 110s  EKG reviewed by me: atrial fibrillation RVR rate 141 bpm  Data reviewed by me 08/05/2023: last 24h vitals tele labs imaging I/O ED provider note, admission H&P  Principal Problem:   Severe sepsis (HCC) Active Problems:   Urinary tract infection   Acute urinary retention   Acute metabolic encephalopathy   BPH with obstruction/lower urinary tract symptoms   AKI (acute kidney injury) (HCC)   MCI (mild cognitive impairment)   HFrEF (heart failure with reduced ejection fraction) (HCC)   Atrial fibrillation (HCC)   OSA on CPAP   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 male  with a past medical history of ischemic cardiomyopathy (EF 20 to 30%), paroxysmal atrial fibrillation on Eliquis, mitral regurgitation s/p mitral valve repair, CAD s/p CABG x1 in 2001 (SVG to RPDA), hypertension, hyperlipidemia, OSA who presented  to the ED on 08/03/2023 for altered mental status, concern for UTI. Noted to be in AF RVR during admission. Cardiology was consulted for further evaluation.   # Atrial fibrillation RVR # Chronic atrial fibrillation Patient with hx of chronic AF presenting with UTI, noted to be in AF RVR and hypotensive precluding many rate control therapies. -Continue midodrine for BP support. Continue po metoprolol tartrate 25 mg twice daily and home digoxin 0.125 mg daily for rate control.  -Continue eliquis 5 mg twice daily for stroke risk reduction. Patient reports strict compliance. -Will plan to attempt cardioversion tomorrow, patient aware of plan and expressed understand. He is amenable to proceeding.   # UTI Patient initially presented with altered mental status, UA with rare bacteria and large leuks. Started on antibiotics in the ED. -Management per primary.  # CAD s/p CABG x1 (SVG to RPDA) 2001 # Ischemic cardiomyopathy # Mitral valve regurgitation s/p repair 2001 Patient with significant cardiac history as above, denies any active chest pain, shortness of breath, lower extremity edema. -Continue to monitor fluid status closely.  Consider restarting home Lasix tomorrow. Further additions to GDMT historically limited by low BP.  -Restart home Zetia 10 mg daily.  This patient's plan of care was discussed and created with Dr. Melton Alar and she is in agreement.  Signed: Gale Journey, PA-C  08/05/2023, 3:00 PM [facility]

## 2023-08-05 NOTE — Progress Notes (Signed)
PT Cancellation Note  Patient Details Name: Juan [name] MRN: [id number] DOB: 1940/11/29   Cancelled Treatment:     Pt resting in bed, current tachy with HR 125-150. Scheduled for cadioversion tomorrow. Will reassess once able to safely mobilize.    [name] 08/05/2023, 3:03 PM

## 2023-08-05 NOTE — Evaluation (Signed)
Occupational Therapy Evaluation Patient Details Name: Juan [name] MRN: [id number] DOB: [birth year] Today's Date: 08/05/2023   History of Present Illness Pt is an 82 y.o. male presenting to hospital 08/03/23 with c/o AMS and difficulty voiding.  Pt admitted with UTI, severe sepsis, immunosuppression secondary to long term steroids, BPH with LUTS, acute metabolic encephalopathy, AKI.  PMH includes CAD, CABG, CHF, a-fib, stroke, OSA on CPAP, mild cognitive impairment, remote lacunar infarcts, back sx, bullous pemphigoid.   Clinical Impression   Pt was seen for OT evaluation this date. Prior to hospital admission, pt was living at home with his wife where he was IND with ADL performance and mobility including walking at the mall almost daily with his wife. No recent falls.   Pt presents to acute OT demonstrating impaired ADL performance and functional mobility 2/2 weakness, impaired balance and decreased activity tolerance related to A-fib (See OT problem list for additional functional deficits). He denies pain. Pt currently requires SUP for all bed mobility. Pt performed oral care seated at EOB with set up assist. HR ranging from 116-165 during session. PA from cardiology okay with bed<>chair transfers until his HR is better controlled. Pt declined tx to recliner d/t recently coming to the floor from ED, but reports he may get up to recliner later.  Pt would benefit from skilled OT services to address noted impairments and functional limitations (see below for any additional details) in order to maximize safety and independence while minimizing falls risk and caregiver burden. Do anticipate the need for follow up OT services upon acute hospital DC.       If plan is discharge home, recommend the following: A little help with walking and/or transfers;A little help with bathing/dressing/bathroom;Help with stairs or ramp for entrance;Assistance with cooking/housework;Assist for  transportation;Supervision due to cognitive status;Direct supervision/assist for financial management;Direct supervision/assist for medications management    Functional Status Assessment  Patient has had a recent decline in their functional status and demonstrates the ability to make significant improvements in function in a reasonable and predictable amount of time.  Equipment Recommendations  None recommended by OT    Recommendations for Other Services       Precautions / Restrictions Precautions Precautions: Fall Precaution Comments: watch HR Restrictions Weight Bearing Restrictions: No      Mobility Bed Mobility Overal bed mobility: Needs Assistance Bed Mobility: Supine to Sit, Sit to Supine     Supine to sit: Supervision, HOB elevated Sit to supine: Supervision, HOB elevated   General bed mobility comments: SUP to Mod I for all bed mobility with HOB elevated    Transfers                   General transfer comment: deferred d/t HR and pt not wishing to get to recliner at this time      Balance Overall balance assessment: Needs assistance Sitting-balance support: No upper extremity supported, Feet supported Sitting balance-Leahy Scale: Good         Standing balance comment: deferred                           ADL either performed or assessed with clinical judgement   ADL Overall ADL's : Needs assistance/impaired     Grooming: Oral care;Sitting;Set up Grooming Details (indicate cue type and reason): seated EOB with set up assist  Vision         Perception         Praxis         Pertinent Vitals/Pain Pain Assessment Pain Assessment: No/denies pain     Extremity/Trunk Assessment Upper Extremity Assessment Upper Extremity Assessment: Overall WFL for tasks assessed   Lower Extremity Assessment Lower Extremity Assessment: Generalized weakness LLE Deficits / Details: L foot  drop       Communication Communication Communication: Hearing impairment   Cognition Arousal: Alert Behavior During Therapy: WFL for tasks assessed/performed                                   General Comments: Oriented to person, hospital, November; pt reported it was 2006 and was not sure why he was in hospital; seems confused     General Comments       Exercises Other Exercises Other Exercises: Edu on role of OT in acute setting and allowing HR to be better managed to progress with therapy.   Shoulder Instructions      Home Living Family/patient expects to be discharged to:: Private residence Living Arrangements: Spouse/significant other Available Help at Discharge: Family;Available PRN/intermittently Type of Home: House Home Access: Stairs to enter [employer] of Steps: 3 Entrance Stairs-Rails:  (L handicap grab bar) Home Layout: One level     Bathroom Shower/Tub: Producer, television/film/video: Standard     Home Equipment: Grab bars - tub/shower;Shower seat - built in;Hand held shower head   Additional Comments: Daughter lives nearby.      Prior Functioning/Environment Prior Level of Function : Independent/Modified Independent             Mobility Comments: H/o L foot drop (wears L ankle brace typically but will walk in house without it as well).  No recent falls reported.  Walks in mall with wife for exercise. ADLs Comments: reports IND        OT Problem List: Decreased activity tolerance;Cardiopulmonary status limiting activity;Decreased strength      OT Treatment/Interventions: Self-care/ADL training;Therapeutic exercise;Patient/family education;Balance training;Energy conservation;Therapeutic activities;DME and/or AE instruction    OT Goals(Current goals can be found in the care plan section) Acute Rehab OT Goals Patient Stated Goal: return home OT Goal Formulation: With patient Time For Goal Achievement:  08/19/23 Potential to Achieve Goals: Good ADL Goals Pt Will Perform Lower Body Bathing: with modified independence;sitting/lateral leans;sit to/from stand Pt Will Perform Lower Body Dressing: with modified independence;sit to/from stand;sitting/lateral leans Pt Will Transfer to Toilet: with modified independence;ambulating;regular height toilet Pt Will Perform Toileting - Clothing Manipulation and hygiene: with modified independence;sitting/lateral leans;sit to/from stand  OT Frequency: Min 1X/week    Co-evaluation              AM-PAC OT "6 Clicks" Daily Activity     Outcome Measure Help from another person eating meals?: None Help from another person taking care of personal grooming?: None Help from another person toileting, which includes using toliet, bedpan, or urinal?: A Little Help from another person bathing (including washing, rinsing, drying)?: A Little Help from another person to put on and taking off regular upper body clothing?: None Help from another person to put on and taking off regular lower body clothing?: A Little 6 Click Score: 21   End of Session Nurse Communication: Mobility status  Activity Tolerance: Patient tolerated treatment well Patient left: in bed;with call bell/phone within reach;with bed  alarm set;with family/visitor present  OT Visit Diagnosis: Other abnormalities of gait and mobility (R26.89)                Time: 2440-1027 OT Time Calculation (min): 19 min Charges:  OT General Charges $OT Visit: 1 Visit OT Evaluation $OT Eval Low Complexity: 1 Low Gryffin Altice, OTR/L 08/05/23, 12:57 PM  Jep Dyas E Kalenna Millett 08/05/2023, 12:54 PM

## 2023-08-05 NOTE — Progress Notes (Signed)
*  PRELIMINARY RESULTS* Echocardiogram 2D Echocardiogram has been performed.  Carolyne Fiscal 08/05/2023, 3:59 PM

## 2023-08-06 ENCOUNTER — Encounter: Payer: Self-pay | Admitting: Anesthesiology

## 2023-08-06 ENCOUNTER — Encounter
Admission: EM | Disposition: A | Discharge status: Home / Self Care | Payer: Self-pay | Source: Home / Self Care | Attending: Student

## 2023-08-06 DIAGNOSIS — A419 Sepsis, unspecified organism: Secondary | ICD-10-CM | POA: Diagnosis not present

## 2023-08-06 DIAGNOSIS — R6520 Severe sepsis without septic shock: Secondary | ICD-10-CM | POA: Diagnosis not present

## 2023-08-06 LAB — CBC
HCT: 30.9 % — ABNORMAL LOW (ref 39.0–52.0)
Hemoglobin: 10.5 g/dL — ABNORMAL LOW (ref 13.0–17.0)
MCH: 33.9 pg (ref 26.0–34.0)
MCHC: 34 g/dL (ref 30.0–36.0)
MCV: 99.7 fL (ref 80.0–100.0)
Platelets: 114 10*3/uL — ABNORMAL LOW (ref 150–400)
RBC: 3.1 MIL/uL — ABNORMAL LOW (ref 4.22–5.81)
RDW: 14 % (ref 11.5–15.5)
WBC: 5.9 10*3/uL (ref 4.0–10.5)
nRBC: 0 % (ref 0.0–0.2)

## 2023-08-06 LAB — BASIC METABOLIC PANEL
Anion gap: 7 (ref 5–15)
BUN: 16 mg/dL (ref 8–23)
CO2: 26 mmol/L (ref 22–32)
Calcium: 8.2 mg/dL — ABNORMAL LOW (ref 8.9–10.3)
Chloride: 100 mmol/L (ref 98–111)
Creatinine, Ser: 1.07 mg/dL (ref 0.61–1.24)
GFR, Estimated: >60 mL/min (ref >60)
Glucose, Bld: 103 mg/dL — ABNORMAL HIGH (ref 70–99)
Potassium: 4.2 mmol/L (ref 3.5–5.1)
Sodium: 133 mmol/L — ABNORMAL LOW (ref 135–145)

## 2023-08-06 LAB — HEPATIC FUNCTION PANEL
ALT: 22 U/L (ref 0–44)
AST: 58 U/L — ABNORMAL HIGH (ref 15–41)
Albumin: 2.9 g/dL — ABNORMAL LOW (ref 3.5–5.0)
Alkaline Phosphatase: 39 U/L (ref 38–126)
Bilirubin, Direct: <0.1 mg/dL (ref 0.0–0.2)
Total Bilirubin: <0.2 mg/dL (ref <1.2)
Total Protein: 5.1 g/dL — ABNORMAL LOW (ref 6.5–8.1)

## 2023-08-06 LAB — MAGNESIUM: Magnesium: 2.1 mg/dL (ref 1.7–2.4)

## 2023-08-06 LAB — PHOSPHORUS: Phosphorus: 2.6 mg/dL (ref 2.5–4.6)

## 2023-08-06 SURGERY — CARDIOVERSION
Anesthesia: General

## 2023-08-06 MED ORDER — BISACODYL 5 MG PO TBEC: 10.0000 mg | DELAYED_RELEASE_TABLET | Freq: Every day | ORAL | 0 refills | Status: DC

## 2023-08-06 MED ORDER — POLYETHYLENE GLYCOL 3350 17 G PO PACK: 17.0000 g | PACK | Freq: Every day | ORAL | 0 refills | Status: DC

## 2023-08-06 MED ORDER — METOPROLOL TARTRATE 25 MG PO TABS: 25.0000 mg | ORAL_TABLET | Freq: Two times a day (BID) | ORAL | 11 refills | Status: DC

## 2023-08-06 MED ORDER — AMOXICILLIN-POT CLAVULANATE 875-125 MG PO TABS: 1.0000 | ORAL_TABLET | Freq: Two times a day (BID) | ORAL | 0 refills | Status: DC

## 2023-08-06 NOTE — Progress Notes (Signed)
     [facility] REHABILITATION SERVICES REFERRAL        Occupational Therapy * Physical Therapy * Speech Therapy                           DATE 08/06/2023 PATIENT NAME Juan [name] PATIENT MRN [id number]        DIAGNOSIS/DIAGNOSIS CODE SEPTICEMIA OR SEVERE SEPSIS   DATE OF DISCHARGE: 08/06/2023       PRIMARY CARE PHYSICIAN  Dr. Aram Beecham             PCP PHONE/FAX: phone [phone] [phone]      Dear Provider (Name: [facility] outpatient __  Fax: [phone]   I certify that I have examined this patient and that occupational/physical/speech therapy is necessary on an outpatient basis.    The patient has expressed interest in completing their recommended course of therapy at your  location.  Once a formal order from the patient's primary care physician has been obtained, please  contact him/her to schedule an appointment for evaluation at your earliest convenience.   [ X ]  Physical Therapy Evaluate and Treat  [  ]  Occupational Therapy Evaluate and Treat  [  ]  Speech Therapy Evaluate and Treat         The patient's primary care physician (listed above) must furnish and be responsible for a formal order such that the recommended services may be furnished while under the primary physician's care, and that the plan of care will be established and reviewed every 30 days (or more often if condition necessitates).

## 2023-08-06 NOTE — Progress Notes (Signed)
[facility] Progress Note  Patient: Juan [name]    VZD:[id number]  DOA: 08/03/2023     Date of Service: the patient was seen and examined on 08/06/2023  Chief Complaint  Patient presents with   Altered Mental Status   Brief hospital course: Juan [name] is a 82 y.o. 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  History is given by wife at bedside who states 3 days ago patient started having difficulty voiding associated with lower abdominal discomfort.  In the past day he became confused beyond his baseline and was very lethargic, sleeping all day.   He has had no fever or chills, nausea or vomiting ED course and data review: Afebrile on arrival, tachycardic to 120 with BP initially 129/89 going as low as 98/70, improving with a small LR bolus to 102/73 by admission. Labs: WBC 10,800 with lactic acid 1.9.  Platelets 115,000(baseline 130-140,000, hemoglobin at baseline at 12.2 Creatinine 1.41, up from baseline of 1.08, with sodium 127 Urinalysis with large leukocyte esterase rare bacteria EKG, personally viewed and interpreted showing A-fib at 115 Chest x-ray nonacute Head CT nonacute.   Bladder scan was done in the ED with 600 m and Foley catheter was placed Patient treated with small fluid bolus given low EF, started on ceftriaxone Hospitalist consulted for admission    Assessment and Plan:  # Atrial fibrillation with RVR Chronic anticoagulation, Continue Eliquis 11/10 patient developed A-fib with RVR, resumed Lopressor 25 mg p.o. twice daily,  Cardizem 10 mg IV one-time dose given and Lopressor 5 mg IV x 2 doses given Cardiology consulted, initially plan was to do cardioversion but today his heart rate is  well-controlled, cardiology recommended no cardioversion.  Cleared for discharge. 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 Sepsis criteria include tachycardia, tachypnea, hypotension, leukocytosis and UTI, organ dysfunction with AKI, metabolic encephalopathy and hypotension S/p sepsis fluid resuscitation given HFrEF with EF 25% Continue Rocephin, Abx for 7 days due to risk of UTI 2/2 foley catheter  Blood culture NGTD, urine culture no growth Transition to Augmentin on discharge   Acute urinary retention BPH with LUTS Bladder scan revealed 600 mL urine Continue Foley for 7 days Continue home tamsulosin  Discontinued home Myrbetriq and oxybutynin 11/12 discontinued Foley catheter for voiding trial, if patient fails voiding then reinsert Foley and discharged with Foley catheter to follow-up with urology as an outpatient.    Acute metabolic encephalopathy, Resolved  Mild cognitive impairment, continue Delirium precautions   AKI (acute kidney injury) Secondary to ATN from sepsis Creatinine 1.41 up from baseline of 1.08 Cr 1.03 AKI resolved s/p IVF Monitor renal function and avoid nephrotoxins   Coronary artery disease with history of CABG No complaints of chest pain and EKG nonacute Beta-blocker was held due to soft blood pressure 11/10 resumed Lopressor 25 mg p.o. twice daily due to A-fib with RVR 11/11 s/p midodrine 10 mg p.o. 3 times daily due to low blood pressure, d/c'd on 11/12   HFrEF (heart failure with reduced ejection fraction) (HCC) History of mitral valve repair Clinically euvolemic to dry Due to soft blood pressure will hold furosemide  Continue digoxin 11/11 s/p midodrine 10 mg p.o. TID, d/c'd on 11/12 Started Lopressor 25 mg p.o. twice daily due to  A. Fib rvr   BP (bullous pemphigoid) Not currently active.  Followed by [facility] dermatology, last seen 05/2023 Pt was on methotrexate and folic acid Most recent  flare was 2 months prior when prednisone was uptitrated from 5 mg to 10 mg(per wife) Currently patient is not on prednisone  Chronic anemia Hemoglobin at baseline at 12.2 Hb 10.5, could be dilutional low  Thrombocytopenia  Chronic thrombocytopenia.  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  Interventions:  Diet: Heart healthy diet DVT Prophylaxis: Therapeutic Anticoagulation with Eliquis    Advance goals of care discussion: Full code  Family Communication: family was present at bedside, at the time of interview.  The pt provided permission to discuss medical plan with the family. Opportunity was given to ask question and all questions were answered satisfactorily.   Disposition:  Pt is from Home, admitted with AMS, Sepsis,UTI, Urine retention, Foley was inserted, on IV antibiotics, developed A-fib with RVR, cardiology consulted.  11/12 Foley discontinued, awaiting for voiding trial Discharge to Home tomorrow a.m. if remains stable.  Subjective: No significant events overnight, denied any chest pain or other pressures. Patient and family agreed to discontinue Foley catheter for voiding trial.  Physical Exam: General: NAD, lying comfortably Appear in no distress, affect appropriate Eyes: PERRLA ENT: Oral Mucosa Clear, moist  Neck: no JVD,  Cardiovascular: Irregular rhythm, VR controlled, no Murmur,  Respiratory: good respiratory effort, Bilateral Air entry equal and Decreased, no Crackles, no wheezes Abdomen: Bowel Sound present, Soft and no tenderness,  Skin: no rashes Extremities: no Pedal edema, no calf tenderness Neurologic: without any new focal findings Gait not checked due to patient safety concerns  Vitals:   08/06/23 1124 08/06/23 1235 08/06/23 1536 08/06/23 1647  BP: 121/87 (!) 134/108 106/61   Pulse: 80 76 85    Resp: 16 17 16    Temp: 98.5 F (36.9 C) 97.9 F (36.6 C) 97.7 F (36.5 C)   TempSrc: Oral  Oral   SpO2: 97% 97% 96%   Weight:    60.7 kg    Intake/Output Summary (Last 24 hours) at 08/06/2023 1713 Last data filed at 08/06/2023 1648 Gross per 24 hour  Intake 120 ml  Output 2000 ml  Net -1880 ml   Filed Weights   08/06/23 1647  Weight: 60.7 kg    Data Reviewed: I have personally reviewed and interpreted daily labs, tele strips, imagings as discussed above. I reviewed all nursing notes, pharmacy notes, vitals, pertinent old records I have discussed plan of care as described above with RN and patient/family.  CBC: Recent Labs  Lab 08/03/23 2358 08/04/23 0921 08/05/23 0128 08/06/23 0308  WBC 10.8* 7.4 6.4 5.9  NEUTROABS 9.5*  --   --   --   HGB 12.2* 11.2* 10.5* 10.5*  HCT 35.8* 32.6* 31.2* 30.9*  MCV 98.6 96.7 99.7 99.7  PLT 115* 111* 104* 114*   Basic Metabolic Panel: Recent Labs  Lab 08/03/23 2358 08/04/23 0921 08/05/23 0128 08/06/23 0308  NA 127* 131* 131* 133*  K 4.6 3.8 4.5 4.2  CL 97* 99 100 100  CO2 22 24 25 26   GLUCOSE 114* 132* 132* 103*  BUN 34* 24* 22 16  CREATININE 1.41* 1.03 1.03 1.07  CALCIUM 8.5* 8.4* 8.0* 8.2*  MG  --  1.9 2.0 2.1  PHOS  --  2.7 2.9 2.6  Studies: No results found.  Scheduled Meds:  apixaban  5 mg Oral BID   bisacodyl  10 mg Oral QHS   Chlorhexidine Gluconate Cloth  6 each Topical Daily   digoxin  0.125 mg Oral Daily   ezetimibe  10 mg Oral QHS   magnesium oxide  400 mg Oral BID   metoprolol tartrate  25 mg Oral BID   polyethylene glycol  17 g Oral Daily   tamsulosin  0.4 mg Oral Daily   Continuous Infusions:  sodium chloride     cefTRIAXone (ROCEPHIN)  IV 2 g (08/06/23 0116)   PRN Meds: acetaminophen **OR** acetaminophen, bisacodyl, ondansetron **OR** ondansetron (ZOFRAN) IV  Time spent: 40 minutes  Author: Gillis Santa. MD [facility] 08/06/2023 5:13 PM  To reach On-call, see care teams to  locate the attending and reach out to them via [email or web address]. If 7PM-7AM, please contact night-coverage If you still have difficulty reaching the attending provider, please page the [facility] (Director on Call) for [facility] on amion for assistance.

## 2023-08-06 NOTE — Progress Notes (Signed)
PT Cancellation Note  Patient Details Name: Juan [name] MRN: [id number] DOB: 1940-11-15   Cancelled Treatment:    Reason Eval/Treat Not Completed: Other (comment)  Pt in bed stating he is resting.  Family in and requested no PT this AM as they are trying to keep HR down so he doesn't need cardioversion that is being deferred for now.     Danielle Dess 08/06/2023, 1:17 PM

## 2023-08-06 NOTE — Plan of Care (Signed)
  Problem: Clinical Measurements: Goal: Diagnostic test results will improve Outcome: Progressing Goal: Signs and symptoms of infection will decrease Outcome: Progressing   Problem: Safety: Goal: Ability to remain free from injury will improve Outcome: Progressing   Problem: Nutrition: Goal: Adequate nutrition will be maintained Outcome: Progressing

## 2023-08-06 NOTE — Progress Notes (Signed)
Occupational Therapy Treatment Patient Details Name: Juan [name] MRN: [id number] DOB: 11-30-1940 Today's Date: 08/06/2023   History of present illness Pt is an 82 y.o. male presenting to hospital 08/03/23 with c/o AMS and difficulty voiding.  Pt admitted with UTI, severe sepsis, immunosuppression secondary to long term steroids, BPH with LUTS, acute metabolic encephalopathy, AKI.  PMH includes CAD, CABG, CHF, a-fib, stroke, OSA on CPAP, mild cognitive impairment, remote lacunar infarcts, back sx, bullous pemphigoid.   OT comments  Pt is supine in bed on arrival. Easily arousable and agreeable to OT session. He denies pain. Pt performed bed mobility with CGA/SUP to reach long sitting then able to get to EOB with increased time. Pt donned/doffed shoes at EOB via lateral leans with SUP. STS with no AD and HHA x1 with CGA and short distance mobility in the room noted to be unsteady, therefore provided RW and pt then able to ambulate x3 laps around the nursing station ~480 feet with CGA/SBA. HR ranging from 89-117. Pt fatigued and reporting his legs were sore at end of session. He returned to bed with all needs in place and will cont to require skilled acute OT services to maximize his safety and IND to return to PLOF. Likely no further OT needs outside of acute setting, but would benefit from a RW and outpatient PT upon DC-notified nurse case manager.       If plan is discharge home, recommend the following:  A little help with walking and/or transfers;A little help with bathing/dressing/bathroom;Help with stairs or ramp for entrance;Assistance with cooking/housework;Assist for transportation;Direct supervision/assist for financial management;Direct supervision/assist for medications management;Supervision due to cognitive status   Equipment Recommendations  Other (comment) (RW)    Recommendations for Other Services      Precautions / Restrictions Precautions Precautions: Fall Precaution  Comments: watch HR Restrictions Weight Bearing Restrictions: No       Mobility Bed Mobility Overal bed mobility: Needs Assistance Bed Mobility: Supine to Sit, Sit to Supine     Supine to sit: Supervision, HOB elevated, Contact guard Sit to supine: Supervision, HOB elevated   General bed mobility comments: CGA to SUP with bed mobility using his wifes hand initially to sit up    Transfers Overall transfer level: Needs assistance Equipment used: None Transfers: Sit to/from Stand Sit to Stand: Contact guard assist           General transfer comment: CGA for STS with HHA and began mobility, but pt noted to be unsteady and wife agreed, provided RW for x3 laps around nursing station ~480 feet with SBA using RW     Balance Overall balance assessment: Needs assistance Sitting-balance support: No upper extremity supported, Feet supported Sitting balance-Leahy Scale: Good Sitting balance - Comments: steady while donning and doffing shoes seated at EOB   Standing balance support: Bilateral upper extremity supported, During functional activity, Reliant on assistive device for balance Standing balance-Leahy Scale: Good Standing balance comment: good standing balance once pt had RW for BUE support, however needed CGA/MIN A d/t unsteadiness with no AD                           ADL either performed or assessed with clinical judgement   ADL Overall ADL's : Needs assistance/impaired                     Lower Body Dressing: Supervision/safety;Sitting/lateral leans Lower Body Dressing Details (indicate cue type and  reason): donning and doffing lace up shoes seated EOB with SUP reports use of shoe horn at home             Functional mobility during ADLs: Supervision/safety;Contact guard assist General ADL Comments: ranging from CGA to SBA using RW as he warmed up his legs    Extremity/Trunk Assessment Upper Extremity Assessment Upper Extremity Assessment:  Overall WFL for tasks assessed   Lower Extremity Assessment Lower Extremity Assessment: Generalized weakness LLE Deficits / Details: L foot drop        Vision       Perception     Praxis      Cognition Arousal: Alert Behavior During Therapy: WFL for tasks assessed/performed                                   General Comments: sleepy on entry, but easily aroused and cooperative throughout therapy session        Exercises Other Exercises Other Exercises: Edu pt and family on use of RW for more stability to safely return home and option/benefits of OP therapy services.    Shoulder Instructions       General Comments HR monitored throughout session on telemetry ranging from 89-117 during mobility ~480 feet using RW    Pertinent Vitals/ Pain       Pain Assessment Pain Assessment: No/denies pain  Home Living                                          Prior Functioning/Environment              Frequency  Min 1X/week        Progress Toward Goals  OT Goals(current goals can now be found in the care plan section)  Progress towards OT goals: Progressing toward goals  Acute Rehab OT Goals Patient Stated Goal: return home OT Goal Formulation: With patient/family Time For Goal Achievement: 08/19/23 Potential to Achieve Goals: Good  Plan      Co-evaluation                 AM-PAC OT "6 Clicks" Daily Activity     Outcome Measure   Help from another person eating meals?: None Help from another person taking care of personal grooming?: None Help from another person toileting, which includes using toliet, bedpan, or urinal?: A Little Help from another person bathing (including washing, rinsing, drying)?: A Little Help from another person to put on and taking off regular upper body clothing?: None Help from another person to put on and taking off regular lower body clothing?: A Little 6 Click Score: 21    End of Session  Equipment Utilized During Treatment: Rolling walker (2 wheels);Gait belt  OT Visit Diagnosis: Other abnormalities of gait and mobility (R26.89);Muscle weakness (generalized) (M62.81)   Activity Tolerance Patient tolerated treatment well   Patient Left in bed;with call bell/phone within reach;with bed alarm set;with family/visitor present   Nurse Communication Mobility status        Time: 1352-1419 OT Time Calculation (min): 27 min  Charges: OT General Charges $OT Visit: 1 Visit OT Treatments $Therapeutic Activity: 23-37 mins  Kimika Streater, OTR/L  08/06/23, 2:34 PM  Amberley Hamler E Makinlee Awwad 08/06/2023, 2:31 PM

## 2023-08-06 NOTE — Progress Notes (Addendum)
[facility] CLINIC CARDIOLOGY PROGRESS NOTE       [name] MRN: [id number] DOB/AGE: [birth year] 82 y.o.  Admit date: 08/03/2023 Referring Physician Dr. Gillis Santa Primary Physician [place], Duane Lope, MD  Primary Cardiologist Dr. Juliann Pares Reason for Consultation atrial fibrillation RVR  HPI: Juan [name] is a 82 y.o. male  with a past medical history of ischemic cardiomyopathy (EF 20 to 30%), paroxysmal atrial fibrillation on Eliquis, mitral regurgitation s/p mitral valve repair, CAD s/p CABG x1 in 2001 (SVG to RPDA), hypertension, hyperlipidemia, OSA who presented to the ED on 08/03/2023 for altered mental status, concern for UTI. Noted to be in AF RVR during admission. Cardiology was consulted for further evaluation.   Interval history: -Patient reports he feels well this AM, denies any chest pain, palpitations.  -States he slept well.  -HR improved on tele this AM in the 80-90s before AM meds. BP improved as well.   Review of systems complete and found to be negative unless listed above    Past Medical History:  Diagnosis Date   Arthritis    back- low   Atrial fibrillation (HCC)    Cancer (HCC)    skin ( basal) cell , face, head, back, inside L ear   Coronary artery disease    Dysrhythmia    MI (myocardial infarction) (HCC)    Sleep apnea    +Cpap in use nightly , last study- 10 yrs. ago   Stroke [facility])    "light stroke"    Past Surgical History:  Procedure Laterality Date   APPENDECTOMY     BACK SURGERY  09/2011   lumbar fusion    CARDIAC CATHETERIZATION     CARDIAC SURGERY     CARDIAC VALVE REPLACEMENT     CORONARY ARTERY BYPASS GRAFT     EYE SURGERY Bilateral    catarascts removed-    HERNIA REPAIR Bilateral 1946, 1990's   inguinal x3 procedures    LUMBAR LAMINECTOMY/DECOMPRESSION MICRODISCECTOMY Left 08/14/2019   Procedure: Laminectomy for facet/synovial cyst - left - Lumbar three-Lumbar four;  Surgeon: Julio Sicks, MD;  Location:  [facility] OR;  Service: Neurosurgery;  Laterality: Left;   TONSILLECTOMY      Medications Prior to Admission  Medication Sig Dispense Refill Last Dose   apixaban (ELIQUIS) 5 MG TABS tablet Take 5 mg by mouth 2 (two) times daily.   08/03/2023 at supper   B Complex-C-E-Zn (B COMPLEX-C-E-ZINC) tablet Take 1 tablet by mouth daily.   Past Week   cholecalciferol (VITAMIN D) 1000 units tablet Take 2,000 Units by mouth daily.   08/03/2023   colesevelam (WELCHOL) 625 MG tablet Take 1,875 mg by mouth 2 (two) times daily.    08/03/2023   digoxin (LANOXIN) 0.125 MG tablet Take 0.125 mg by mouth daily. lunch   08/03/2023 at lunch   doxycycline (VIBRAMYCIN) 100 MG capsule Take 100 mg by mouth at bedtime.   2 08/03/2023   ezetimibe (ZETIA) 10 MG tablet Take 10 mg by mouth at bedtime.    08/03/2023   folic acid (FOLVITE) 1 MG tablet Take 5 mg by mouth daily.   08/03/2023   MAGNESIUM GLUCONATE PO Take 350 mg by mouth daily. Take 115mg  tablet daily.      methotrexate (RHEUMATREX) 2.5 MG tablet Take 10 mg by mouth once a week.   08/03/2023   mirabegron ER (MYRBETRIQ) 50 MG TB24 tablet Take 50 mg by mouth daily.   08/03/2023   oxybutynin (DITROPAN) 5 MG tablet Take  1 tablet by mouth 2 (two) times daily.   08/03/2023   Potassium 99 MG TABS Take 99 mg by mouth daily.      tamsulosin (FLOMAX) 0.4 MG CAPS capsule Take 0.4 mg by mouth.   08/03/2023   vitamin E 1000 UNIT capsule Take 1,000 Units by mouth daily. Lunch   08/03/2023   furosemide (LASIX) 20 MG tablet Take 20 mg by mouth daily. lunch      metoprolol tartrate (LOPRESSOR) 50 MG tablet Take 25 mg by mouth 2 (two) times daily.      selenium sulfide (SELSUN) 2.5 % shampoo Apply 1 application topically daily.   PRN at PRN   Social History   Socioeconomic History   Marital status: Married    Spouse name: Not on file   Number of children: Not on file   Years of education: Not on file   Highest education level: Not on file  Occupational History   Not on file  Tobacco Use    Smoking status: Never   Smokeless tobacco: Never  Substance and Sexual Activity   Alcohol use: No   Drug use: No   Sexual activity: Not on file  Other Topics Concern   Not on file  Social History Narrative   Not on file   Social Determinants of Health   Financial Resource Strain: Low Risk  (05/30/2023)   Received from [facility]   Overall Financial Resource Strain (CARDIA)    Difficulty of Paying Living Expenses: Not hard at all  Food Insecurity: Not on file (08/05/2023)  Transportation Needs: Not on file (08/05/2023)  Physical Activity: Not on file  Stress: Not on file  Social Connections: Not on file  Intimate Partner Violence: Unknown (08/05/2023)   Humiliation, Afraid, Rape, and Kick questionnaire    Fear of Current or Ex-Partner: Patient unable to answer    Emotionally Abused: Not on file    Physically Abused: Not on file    Sexually Abused: Not on file    No family history on file.   Vitals:   08/05/23 2058 08/05/23 2344 08/06/23 0329 08/06/23 0806  BP: 129/72 128/89 (!) 131/98 123/62  Pulse: 88 78 (!) 56 100  Resp: 17 18 17 16   Temp: 98.5 F (36.9 C) 99 F (37.2 C) 98.6 F (37 C) 98.1 F (36.7 C)  TempSrc:      SpO2: 98% 99% 93% 90%    PHYSICAL EXAM General: Well appearing, well nourished, in no acute distress sitting upright in hospital bed with wife present at bedside. HEENT: Normocephalic and atraumatic. Neck: No JVD.  Lungs: Normal respiratory effort on room air. Clear bilaterally to auscultation. No wheezes, crackles, rhonchi.  Heart: Irregularly irregular, fast rate. Normal S1 and S2 without gallops or murmurs.  Abdomen: Non-distended appearing.  Msk: Normal strength and tone for age. Extremities: Warm and well perfused. No clubbing, cyanosis. No edema.  Neuro: Alert and oriented X 3. Psych: Answers questions appropriately.   Labs: Basic Metabolic Panel: Recent Labs    08/05/23 0128 08/06/23 0308  NA 131* 133*  K 4.5 4.2   CL 100 100  CO2 25 26  GLUCOSE 132* 103*  BUN 22 16  CREATININE 1.03 1.07  CALCIUM 8.0* 8.2*  MG 2.0 2.1  PHOS 2.9 2.6   Liver Function Tests: Recent Labs    08/05/23 0128 08/06/23 0308  AST 62* 58*  ALT 22 22  ALKPHOS 42 39  BILITOT 0.7 <0.2  PROT 5.4* 5.1*  ALBUMIN 3.0* 2.9*   No results for input(s): "LIPASE", "AMYLASE" in the last 72 hours. CBC: Recent Labs    08/03/23 2358 08/04/23 0921 08/05/23 0128 08/06/23 0308  WBC 10.8*   < > 6.4 5.9  NEUTROABS 9.5*  --   --   --   HGB 12.2*   < > 10.5* 10.5*  HCT 35.8*   < > 31.2* 30.9*  MCV 98.6   < > 99.7 99.7  PLT 115*   < > 104* 114*   < > = values in this interval not displayed.   Cardiac Enzymes: No results for input(s): "CKTOTAL", "CKMB", "CKMBINDEX", "TROPONINIHS" in the last 72 hours. BNP: No results for input(s): "BNP" in the last 72 hours. D-Dimer: No results for input(s): "DDIMER" in the last 72 hours. Hemoglobin A1C: No results for input(s): "HGBA1C" in the last 72 hours. Fasting Lipid Panel: No results for input(s): "CHOL", "HDL", "LDLCALC", "TRIG", "CHOLHDL", "LDLDIRECT" in the last 72 hours. Thyroid Function Tests: No results for input(s): "TSH", "T4TOTAL", "T3FREE", "THYROIDAB" in the last 72 hours.  Invalid input(s): "FREET3" Anemia Panel: No results for input(s): "VITAMINB12", "FOLATE", "FERRITIN", "TIBC", "IRON", "RETICCTPCT" in the last 72 hours.   Radiology: ECHOCARDIOGRAM COMPLETE  Result Date: 08/05/2023    ECHOCARDIOGRAM REPORT   Patient Name:   Juan [name] Date of Exam: 08/05/2023 Medical Rec #:  [id number]            Height:       69.0 in Accession #:    [id number]           Weight:       149.9 lb Date of Birth:  [birth year]            BSA:          1.828 m Patient Age:    82 years             BP:           111/67 mmHg Patient Gender: M                    HR:           116 bpm. Exam Location:  [facility] Procedure: 2D Echo, Cardiac Doppler and Color Doppler Indications:     Atrial  Fibrillation  History:         Patient has no prior history of Echocardiogram examinations.                  CAD, Prior CABG, Arrythmias:Atrial Fibrillation; Risk                  Factors:Sleep Apnea.  Sonographer:     Mikki Harbor Referring Phys:  [id number] Gillis Santa Diagnosing Phys: Lorine Bears MD IMPRESSIONS  1. Left ventricular ejection fraction, by estimation, is 25 to 30%. The left ventricle has severely decreased function. The left ventricle demonstrates global hypokinesis. The left ventricular internal cavity size was mildly to moderately dilated. There  is mild left ventricular hypertrophy. Left ventricular diastolic parameters are indeterminate.  2. Right ventricular systolic function is normal. The right ventricular size is normal. There is moderately elevated pulmonary artery systolic pressure.  3. Left atrial size was severely dilated.  4. Right atrial size was mildly dilated.  5. The mitral valve is abnormal. No evidence of mitral valve regurgitation. No evidence of mitral stenosis. Severe mitral annular calcification.  6. Tricuspid valve regurgitation is moderate.  7. The aortic valve is normal in  structure. Aortic valve regurgitation is mild. Aortic valve sclerosis is present, with no evidence of aortic valve stenosis.  8. Pulmonic valve regurgitation is moderate.  9. Moderately dilated pulmonary artery. 10. The inferior vena cava is dilated in size with >50% respiratory variability, suggesting right atrial pressure of 8 mmHg. FINDINGS  Left Ventricle: Left ventricular ejection fraction, by estimation, is 25 to 30%. The left ventricle has severely decreased function. The left ventricle demonstrates global hypokinesis. The left ventricular internal cavity size was mildly to moderately dilated. There is mild left ventricular hypertrophy. Left ventricular diastolic parameters are indeterminate. Right Ventricle: The right ventricular size is normal. No increase in right ventricular wall  thickness. Right ventricular systolic function is normal. There is moderately elevated pulmonary artery systolic pressure. The tricuspid regurgitant velocity is 3.26 m/s, and with an assumed right atrial pressure of 8 mmHg, the estimated right ventricular systolic pressure is 50.5 mmHg. Left Atrium: Left atrial size was severely dilated. Right Atrium: Right atrial size was mildly dilated. Pericardium: There is no evidence of pericardial effusion. Mitral Valve: The mitral valve is abnormal. There is moderate thickening of the mitral valve leaflet(s). Severe mitral annular calcification. No evidence of mitral valve regurgitation. No evidence of mitral valve stenosis. MV peak gradient, 9.6 mmHg. The  mean mitral valve gradient is 4.0 mmHg. Tricuspid Valve: The tricuspid valve is normal in structure. Tricuspid valve regurgitation is moderate . No evidence of tricuspid stenosis. Aortic Valve: The aortic valve is normal in structure. Aortic valve regurgitation is mild. Aortic valve sclerosis is present, with no evidence of aortic valve stenosis. Aortic valve mean gradient measures 5.7 mmHg. Aortic valve peak gradient measures 9.3  mmHg. Aortic valve area, by VTI measures 3.52 cm. Pulmonic Valve: The pulmonic valve was normal in structure. Pulmonic valve regurgitation is moderate. No evidence of pulmonic stenosis. Aorta: The aortic root is normal in size and structure. Pulmonary Artery: The pulmonary artery is moderately dilated. Venous: The inferior vena cava is dilated in size with greater than 50% respiratory variability, suggesting right atrial pressure of 8 mmHg. IAS/Shunts: No atrial level shunt detected by color flow Doppler.  LEFT VENTRICLE PLAX 2D LVIDd:         6.10 cm LVIDs:         5.40 cm LV PW:         1.10 cm LV IVS:        1.10 cm LVOT diam:     2.20 cm LV SV:         90 LV SV Index:   49 LVOT Area:     3.80 cm  RIGHT VENTRICLE RV Basal diam:  3.80 cm RV Mid diam:    2.50 cm RV S prime:     11.40 cm/s  LEFT ATRIUM              Index         RIGHT ATRIUM           Index LA diam:        4.90 cm  2.68 cm/m    RA Area:     21.70 cm LA Vol (A2C):   185.0 ml 101.22 ml/m  RA Volume:   64.80 ml  35.45 ml/m LA Vol (A4C):   151.0 ml 82.61 ml/m LA Biplane Vol: 174.0 ml 95.20 ml/m  AORTIC VALVE                     PULMONIC VALVE AV Area (Vmax):  3.04 cm      PV Vmax:       0.76 m/s AV Area (Vmean):   2.76 cm      PV Peak grad:  2.3 mmHg AV Area (VTI):     3.52 cm AV Vmax:           152.67 cm/s AV Vmean:          104.800 cm/s AV VTI:            0.257 m AV Peak Grad:      9.3 mmHg AV Mean Grad:      5.7 mmHg LVOT Vmax:         122.00 cm/s LVOT Vmean:        76.200 cm/s LVOT VTI:          0.238 m LVOT/AV VTI ratio: 0.93  AORTA Ao Root diam: 3.50 cm MITRAL VALVE                TRICUSPID VALVE MV Area (PHT): 3.31 cm     TR Peak grad:   42.5 mmHg MV Area VTI:   3.37 cm     TR Vmax:        326.00 cm/s MV Peak grad:  9.6 mmHg MV Mean grad:  4.0 mmHg     SHUNTS MV Vmax:       1.55 m/s     Systemic VTI:  0.24 m MV Vmean:      86.0 cm/s    Systemic Diam: 2.20 cm MV Decel Time: 229 msec MV E velocity: 165.00 cm/s Lorine Bears MD Electronically signed by Lorine Bears MD Signature Date/Time: 08/05/2023/5:19:48 PM    Final    DG Chest Portable 1 View  Result Date: 08/04/2023 CLINICAL DATA:  Altered mental status. EXAM: PORTABLE CHEST 1 VIEW COMPARISON:  December 06, 2017 FINDINGS: Multiple sternal wires are noted. The cardiac silhouette is mildly enlarged and unchanged in size. An artificial cardiac valve is seen. There is mild, stable elevation of the left hemidiaphragm. There is no evidence of an acute infiltrate, pleural effusion or pneumothorax. The visualized skeletal structures are unremarkable. IMPRESSION: 1. Evidence of prior median sternotomy and artificial cardiac valve placement. 2. No acute or active cardiopulmonary disease. Electronically Signed   By: Aram Candela M.D.   On: 08/04/2023 00:58   CT HEAD  WO CONTRAST ( )  Result Date: 08/04/2023 CLINICAL DATA:  altered on eliquis, eval ich EXAM: CT HEAD WITHOUT CONTRAST TECHNIQUE: Contiguous axial images were obtained from the base of the skull through the vertex without intravenous contrast. RADIATION DOSE REDUCTION: This exam was performed according to the departmental dose-optimization program which includes automated exposure control, adjustment of the mA and/or kV according to patient size and/or use of iterative reconstruction technique. COMPARISON:  CT head 05/04/2023, MRA head 05/22/2022, MRI head 06/16/2018 FINDINGS: Brain: Cerebral ventricle sizes are concordant with the degree of cerebral volume loss. Patchy and confluent areas of decreased attenuation are noted throughout the deep and periventricular white matter of the cerebral hemispheres bilaterally, compatible with chronic microvascular ischemic disease. Chronic lacunar left basal ganglia insular region infarctions. Chronic left cerebellar infarction. No evidence of large-territorial acute infarction. No parenchymal hemorrhage. No mass lesion. No extra-axial collection. No mass effect or midline shift. No hydrocephalus. Basilar cisterns are patent. Vascular: No hyperdense vessel. Atherosclerotic calcifications are present within the cavernous internal carotid arteries. Gas noted within the venous system likely due to IV placement. Skull: No acute fracture or focal lesion. Sinuses/Orbits:  Paranasal sinuses and mastoid air cells are clear. Bilateral lens replacement. Otherwise the orbits are unremarkable. Other: None. IMPRESSION: No acute intracranial abnormality. Electronically Signed   By: Tish Frederickson M.D.   On: 08/04/2023 00:44    ECHO as above  TELEMETRY reviewed by me 08/06/2023: atrial fibrillation, PVCs rate 80-90s  EKG reviewed by me: atrial fibrillation RVR rate 141 bpm  Data reviewed by me 08/06/2023: last 24h vitals tele labs imaging I/O ED provider note, admission  H&P  Principal Problem:   Severe sepsis (HCC) Active Problems:   Urinary tract infection   Acute urinary retention   Acute metabolic encephalopathy   BPH with obstruction/lower urinary tract symptoms   AKI (acute kidney injury) (HCC)   MCI (mild cognitive impairment)   HFrEF (heart failure with reduced ejection fraction) (HCC)   Atrial fibrillation (HCC)   OSA on CPAP   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 male  with a past medical history of ischemic cardiomyopathy (EF 20 to 30%), paroxysmal atrial fibrillation on Eliquis, mitral regurgitation s/p mitral valve repair, CAD s/p CABG x1 in 2001 (SVG to RPDA), hypertension, hyperlipidemia, OSA who presented to the ED on 08/03/2023 for altered mental status, concern for UTI. Noted to be in AF RVR during admission. Cardiology was consulted for further evaluation.   # Atrial fibrillation RVR # Chronic atrial fibrillation Patient with hx of chronic AF presenting with UTI, initially noted to be in AF RVR and hypotensive precluding many rate control therapies. HR and BP now improved.  -Continue midodrine for BP support. Continue po metoprolol tartrate 25 mg twice daily and home digoxin 0.125 mg daily for rate control.  -Continue eliquis 5 mg twice daily for stroke risk reduction. Patient reports strict compliance. -Defer CV today as rate & BP improved, unlikely to be successful given longstanding hx of AF.   # UTI Patient initially presented with altered mental status, UA with rare bacteria and large leuks. Started on antibiotics in the ED. -Management per primary.  # CAD s/p CABG x1 (SVG to RPDA) 2001 # Ischemic cardiomyopathy # Mitral valve  regurgitation s/p repair 2001 Patient with significant cardiac history as above, denies any active chest pain, shortness of breath, lower extremity edema. -Continue to monitor fluid status closely.  Consider restarting home Lasix tomorrow. Further additions to GDMT historically limited by low BP.  -Continue home Zetia 10 mg daily.  This patient's plan of care was discussed and created with Dr. Juliann Pares and he is in agreement.  Signed: Gale Journey, PA-C  08/06/2023, 11:11 AM [facility]

## 2023-08-06 NOTE — Consult Note (Signed)
[facility] Liaison Note  08/06/2023  Juan [name] 07/19/1941 [id number]  Location: RN Hospital Liaison screened the patient remotely at [facility].  Insurance: [employer]   Juan [name] is a 82 y.o. male who is a Primary Care Patient of Marguarite Arbour, MD-[facility]. The patient was screened for  readmission hospitalization with noted medium risk score for unplanned readmission risk with 1 IP/ 1 ED in 6 months.  The patient was assessed for potential Care Management service needs for post hospital transition for care coordination. Review of patient's electronic medical record reveals patient was admitted for Severe Sepsis. Provider's office will completed the transition of care follow up post hospital discharge. No anticipated needs for VBCI services at this time.   VBCI Care Management/Population Health does not replace or interfere with any arrangements made by the Inpatient Transition of Care team.   For questions contact:   Elliot Cousin, RN, [facility] Liaison [place]   [facility], Population Health Office Hours MTWF  8:00 am-6:00 pm Direct Dial: [phone] mobile [phone] [Office toll free line] Office Hours are M-F 8:30 - 5 pm [email or web address]

## 2023-08-06 NOTE — Plan of Care (Signed)

## 2023-08-06 NOTE — TOC Transition Note (Addendum)
Transition of Care [facility]) - CM/SW Discharge Note   Patient Details  Name: Juan [name] MRN: [id number] Date of Birth: [birth year]  [facility] of [facility]) CM/SW Contact:  Truddie Hidden, RN Phone Number: 08/06/2023, 2:46 PM   Clinical Narrative:    Spoke with patient's daughter regarding discharge plans home.  She is agreeable to walker and is requesting [facility] for out patient therapy.  Request for RW sent to Jon from [employer].  TOC signing off           Patient Goals and CMS Choice      Discharge Placement                         Discharge Plan and Services Additional resources added to the After Visit Summary for                                       Social Determinants of Health (SDOH) Interventions SDOH Screenings   Utilities: Patient Unable To Answer (08/05/2023)  Financial Resource Strain: Low Risk  (05/30/2023)   Received from [facility]  Tobacco Use: Low Risk  (07/09/2023)   Received from [facility]  Health Literacy: Low Risk  (07/26/2021)   Received from [facility]     Readmission Risk Interventions     No data to display

## 2023-08-07 DIAGNOSIS — I5020 Unspecified systolic (congestive) heart failure: Secondary | ICD-10-CM

## 2023-08-07 DIAGNOSIS — Z515 Encounter for palliative care: Secondary | ICD-10-CM | POA: Diagnosis not present

## 2023-08-07 DIAGNOSIS — I4891 Unspecified atrial fibrillation: Secondary | ICD-10-CM | POA: Diagnosis not present

## 2023-08-07 DIAGNOSIS — G9341 Metabolic encephalopathy: Secondary | ICD-10-CM

## 2023-08-07 DIAGNOSIS — R6520 Severe sepsis without septic shock: Secondary | ICD-10-CM | POA: Diagnosis not present

## 2023-08-07 DIAGNOSIS — G3184 Mild cognitive impairment, so stated: Secondary | ICD-10-CM

## 2023-08-07 DIAGNOSIS — Z7189 Other specified counseling: Secondary | ICD-10-CM

## 2023-08-07 DIAGNOSIS — A419 Sepsis, unspecified organism: Secondary | ICD-10-CM | POA: Diagnosis not present

## 2023-08-07 LAB — CBC
HCT: 30.7 % — ABNORMAL LOW (ref 39.0–52.0)
Hemoglobin: 10.4 g/dL — ABNORMAL LOW (ref 13.0–17.0)
MCH: 33.8 pg (ref 26.0–34.0)
MCHC: 33.9 g/dL (ref 30.0–36.0)
MCV: 99.7 fL (ref 80.0–100.0)
Platelets: 112 10*3/uL — ABNORMAL LOW (ref 150–400)
RBC: 3.08 MIL/uL — ABNORMAL LOW (ref 4.22–5.81)
RDW: 14 % (ref 11.5–15.5)
WBC: 5.3 10*3/uL (ref 4.0–10.5)
nRBC: 0 % (ref 0.0–0.2)

## 2023-08-07 LAB — PHOSPHORUS: Phosphorus: 2.9 mg/dL (ref 2.5–4.6)

## 2023-08-07 LAB — HEPATIC FUNCTION PANEL
ALT: 20 U/L (ref 0–44)
AST: 51 U/L — ABNORMAL HIGH (ref 15–41)
Albumin: 2.7 g/dL — ABNORMAL LOW (ref 3.5–5.0)
Alkaline Phosphatase: 35 U/L — ABNORMAL LOW (ref 38–126)
Bilirubin, Direct: <0.1 mg/dL (ref 0.0–0.2)
Total Bilirubin: 0.5 mg/dL (ref <1.2)
Total Protein: 5 g/dL — ABNORMAL LOW (ref 6.5–8.1)

## 2023-08-07 LAB — BASIC METABOLIC PANEL
Anion gap: 8 (ref 5–15)
BUN: 16 mg/dL (ref 8–23)
CO2: 25 mmol/L (ref 22–32)
Calcium: 7.8 mg/dL — ABNORMAL LOW (ref 8.9–10.3)
Chloride: 98 mmol/L (ref 98–111)
Creatinine, Ser: 0.95 mg/dL (ref 0.61–1.24)
GFR, Estimated: >60 mL/min (ref >60)
Glucose, Bld: 93 mg/dL (ref 70–99)
Potassium: 4.2 mmol/L (ref 3.5–5.1)
Sodium: 131 mmol/L — ABNORMAL LOW (ref 135–145)

## 2023-08-07 LAB — MAGNESIUM: Magnesium: 2 mg/dL (ref 1.7–2.4)

## 2023-08-07 NOTE — Progress Notes (Signed)
[facility] CLINIC CARDIOLOGY PROGRESS NOTE       [name] MRN: [id number] DOB/AGE: 12/31/1940 82 y.o.  Admit date: 08/03/2023 Referring Physician Dr. Gillis Santa Primary Physician Sparks, Duane Lope, MD  Primary Cardiologist Dr. Juliann Pares Reason for Consultation atrial fibrillation RVR  HPI: Juan [name] is a 82 y.o. male  with a past medical history of ischemic cardiomyopathy (EF 20 to 30%), paroxysmal atrial fibrillation on Eliquis, mitral regurgitation s/p mitral valve repair, CAD s/p CABG x1 in 2001 (SVG to RPDA), hypertension, hyperlipidemia, OSA who presented to the ED on 08/03/2023 for altered mental status, concern for UTI. Noted to be in AF RVR during admission. Cardiology was consulted for further evaluation.   Interval history: -Patient reports he feels well this AM, denies any chest pain, palpitations.  -States he slept well. Had issues overnight with urinary retention. -HR slightly up this AM but has not received meds and family present in room.   Review of systems complete and found to be negative unless listed above    Past Medical History:  Diagnosis Date   Arthritis    back- low   Atrial fibrillation (HCC)    Cancer (HCC)    skin ( basal) cell , face, head, back, inside L ear   Coronary artery disease    Dysrhythmia    MI (myocardial infarction) (HCC)    Sleep apnea    +Cpap in use nightly , last study- 10 yrs. ago   Stroke [facility])    "light stroke"    Past Surgical History:  Procedure Laterality Date   APPENDECTOMY     BACK SURGERY  09/2011   lumbar fusion    CARDIAC CATHETERIZATION     CARDIAC SURGERY     CARDIAC VALVE REPLACEMENT     CORONARY ARTERY BYPASS GRAFT     EYE SURGERY Bilateral    catarascts removed-    HERNIA REPAIR Bilateral 1946, 1990's   inguinal x3 procedures    LUMBAR LAMINECTOMY/DECOMPRESSION MICRODISCECTOMY Left 08/14/2019   Procedure: Laminectomy for facet/synovial cyst - left - Lumbar three-Lumbar  four;  Surgeon: Julio Sicks, MD;  Location[facility] OR;  Service: Neurosurgery;  Laterality: Left;   TONSILLECTOMY      Medications Prior to Admission  Medication Sig Dispense Refill Last Dose   apixaban (ELIQUIS) 5 MG TABS tablet Take 5 mg by mouth 2 (two) times daily.   08/03/2023 at supper   B Complex-C-E-Zn (B COMPLEX-C-E-ZINC) tablet Take 1 tablet by mouth daily.   Past Week   cholecalciferol (VITAMIN D) 1000 units tablet Take 2,000 Units by mouth daily.   08/03/2023   colesevelam (WELCHOL) 625 MG tablet Take 1,875 mg by mouth 2 (two) times daily.    08/03/2023   digoxin (LANOXIN) 0.125 MG tablet Take 0.125 mg by mouth daily. lunch   08/03/2023 at lunch   doxycycline (VIBRAMYCIN) 100 MG capsule Take 100 mg by mouth at bedtime.   2 08/03/2023   ezetimibe (ZETIA) 10 MG tablet Take 10 mg by mouth at bedtime.    08/03/2023   folic acid (FOLVITE) 1 MG tablet Take 5 mg by mouth daily.   08/03/2023   MAGNESIUM GLUCONATE PO Take 350 mg by mouth daily. Take 115mg  tablet daily.      methotrexate (RHEUMATREX) 2.5 MG tablet Take 10 mg by mouth once a week.   08/03/2023   mirabegron ER (MYRBETRIQ) 50 MG TB24 tablet Take 50 mg by mouth daily.   08/03/2023   oxybutynin (DITROPAN)  5 MG tablet Take 1 tablet by mouth 2 (two) times daily.   08/03/2023   Potassium 99 MG TABS Take 99 mg by mouth daily.      tamsulosin (FLOMAX) 0.4 MG CAPS capsule Take 0.4 mg by mouth.   08/03/2023   vitamin E 1000 UNIT capsule Take 1,000 Units by mouth daily. Lunch   08/03/2023   furosemide (LASIX) 20 MG tablet Take 20 mg by mouth daily. lunch      selenium sulfide (SELSUN) 2.5 % shampoo Apply 1 application topically daily.   PRN at PRN   [DISCONTINUED] metoprolol tartrate (LOPRESSOR) 50 MG tablet Take 25 mg by mouth 2 (two) times daily.      Social History   Socioeconomic History   Marital status: Married    Spouse name: Not on file   Number of children: Not on file   Years of education: Not on file   Highest education level: Not on  file  Occupational History   Not on file  Tobacco Use   Smoking status: Never   Smokeless tobacco: Never  Substance and Sexual Activity   Alcohol use: No   Drug use: No   Sexual activity: Not on file  Other Topics Concern   Not on file  Social History Narrative   Not on file   Social Determinants of Health   Financial Resource Strain: Low Risk  (05/30/2023)   Received from [facility]   Overall Financial Resource Strain (CARDIA)    Difficulty of Paying Living Expenses: Not hard at all  Food Insecurity: Not on file (08/05/2023)  Transportation Needs: Not on file (08/05/2023)  Physical Activity: Not on file  Stress: Not on file  Social Connections: Not on file  Intimate Partner Violence: Unknown (08/05/2023)   Humiliation, Afraid, Rape, and Kick questionnaire    Fear of Current or Ex-Partner: Patient unable to answer    Emotionally Abused: Not on file    Physically Abused: Not on file    Sexually Abused: Not on file    No family history on file.   Vitals:   08/06/23 2104 08/06/23 2320 08/07/23 0413 08/07/23 0821  BP: 112/65 122/60 93/60 111/60  Pulse: 93 (!) 101 79 76  Resp: 18 18 18 16   Temp: 98 F (36.7 C) 98.2 F (36.8 C) 98.3 F (36.8 C) 97.9 F (36.6 C)  TempSrc: Oral Oral Oral   SpO2: 98% 98% 95% 95%  Weight:        PHYSICAL EXAM General: Well appearing, well nourished, in no acute distress sitting upright in bedside chair eating breakfast with family present at bedside. HEENT: Normocephalic and atraumatic. Neck: No JVD.  Lungs: Normal respiratory effort on room air. Clear bilaterally to auscultation. No wheezes, crackles, rhonchi.  Heart: Irregularly irregular. Normal S1 and S2 without gallops or murmurs.  Abdomen: Non-distended appearing.  Msk: Normal strength and tone for age. Extremities: Warm and well perfused. No clubbing, cyanosis. No edema.  Neuro: Alert and oriented X 3. Psych: Answers questions appropriately.   Labs: Basic  Metabolic Panel: Recent Labs    08/06/23 0308 08/07/23 0406  NA 133* 131*  K 4.2 4.2  CL 100 98  CO2 26 25  GLUCOSE 103* 93  BUN 16 16  CREATININE 1.07 0.95  CALCIUM 8.2* 7.8*  MG 2.1 2.0  PHOS 2.6 2.9   Liver Function Tests: Recent Labs    08/06/23 0308 08/07/23 0406  AST 58* 51*  ALT 22 20  ALKPHOS 39  35*  BILITOT <0.2 0.5  PROT 5.1* 5.0*  ALBUMIN 2.9* 2.7*   No results for input(s): "LIPASE", "AMYLASE" in the last 72 hours. CBC: Recent Labs    08/06/23 0308 08/07/23 0406  WBC 5.9 5.3  HGB 10.5* 10.4*  HCT 30.9* 30.7*  MCV 99.7 99.7  PLT 114* 112*   Cardiac Enzymes: No results for input(s): "CKTOTAL", "CKMB", "CKMBINDEX", "TROPONINIHS" in the last 72 hours. BNP: No results for input(s): "BNP" in the last 72 hours. D-Dimer: No results for input(s): "DDIMER" in the last 72 hours. Hemoglobin A1C: No results for input(s): "HGBA1C" in the last 72 hours. Fasting Lipid Panel: No results for input(s): "CHOL", "HDL", "LDLCALC", "TRIG", "CHOLHDL", "LDLDIRECT" in the last 72 hours. Thyroid Function Tests: No results for input(s): "TSH", "T4TOTAL", "T3FREE", "THYROIDAB" in the last 72 hours.  Invalid input(s): "FREET3" Anemia Panel: No results for input(s): "VITAMINB12", "FOLATE", "FERRITIN", "TIBC", "IRON", "RETICCTPCT" in the last 72 hours.   Radiology: ECHOCARDIOGRAM COMPLETE  Result Date: 08/05/2023    ECHOCARDIOGRAM REPORT   Patient Name:   Juan [name] Date of Exam: 08/05/2023 Medical Rec #:  [id number]            Height:       69.0 in Accession #:    [id number]           Weight:       149.9 lb Date of Birth:  [birth year]            BSA:          1.828 m Patient Age:    82 years             BP:           111/67 mmHg Patient Gender: M                    HR:           116 bpm. Exam Location:  [facility] Procedure: 2D Echo, Cardiac Doppler and Color Doppler Indications:     Atrial Fibrillation  History:         Patient has no prior history of Echocardiogram  examinations.                  CAD, Prior CABG, Arrythmias:Atrial Fibrillation; Risk                  Factors:Sleep Apnea.  Sonographer:     Mikki Harbor Referring Phys:  [id number] Gillis Santa Diagnosing Phys: Lorine Bears MD IMPRESSIONS  1. Left ventricular ejection fraction, by estimation, is 25 to 30%. The left ventricle has severely decreased function. The left ventricle demonstrates global hypokinesis. The left ventricular internal cavity size was mildly to moderately dilated. There  is mild left ventricular hypertrophy. Left ventricular diastolic parameters are indeterminate.  2. Right ventricular systolic function is normal. The right ventricular size is normal. There is moderately elevated pulmonary artery systolic pressure.  3. Left atrial size was severely dilated.  4. Right atrial size was mildly dilated.  5. The mitral valve is abnormal. No evidence of mitral valve regurgitation. No evidence of mitral stenosis. Severe mitral annular calcification.  6. Tricuspid valve regurgitation is moderate.  7. The aortic valve is normal in structure. Aortic valve regurgitation is mild. Aortic valve sclerosis is present, with no evidence of aortic valve stenosis.  8. Pulmonic valve regurgitation is moderate.  9. Moderately dilated pulmonary artery. 10. The inferior vena cava is dilated in size with >50%  respiratory variability, suggesting right atrial pressure of 8 mmHg. FINDINGS  Left Ventricle: Left ventricular ejection fraction, by estimation, is 25 to 30%. The left ventricle has severely decreased function. The left ventricle demonstrates global hypokinesis. The left ventricular internal cavity size was mildly to moderately dilated. There is mild left ventricular hypertrophy. Left ventricular diastolic parameters are indeterminate. Right Ventricle: The right ventricular size is normal. No increase in right ventricular wall thickness. Right ventricular systolic function is normal. There is moderately elevated  pulmonary artery systolic pressure. The tricuspid regurgitant velocity is 3.26 m/s, and with an assumed right atrial pressure of 8 mmHg, the estimated right ventricular systolic pressure is 50.5 mmHg. Left Atrium: Left atrial size was severely dilated. Right Atrium: Right atrial size was mildly dilated. Pericardium: There is no evidence of pericardial effusion. Mitral Valve: The mitral valve is abnormal. There is moderate thickening of the mitral valve leaflet(s). Severe mitral annular calcification. No evidence of mitral valve regurgitation. No evidence of mitral valve stenosis. MV peak gradient, 9.6 mmHg. The  mean mitral valve gradient is 4.0 mmHg. Tricuspid Valve: The tricuspid valve is normal in structure. Tricuspid valve regurgitation is moderate . No evidence of tricuspid stenosis. Aortic Valve: The aortic valve is normal in structure. Aortic valve regurgitation is mild. Aortic valve sclerosis is present, with no evidence of aortic valve stenosis. Aortic valve mean gradient measures 5.7 mmHg. Aortic valve peak gradient measures 9.3  mmHg. Aortic valve area, by VTI measures 3.52 cm. Pulmonic Valve: The pulmonic valve was normal in structure. Pulmonic valve regurgitation is moderate. No evidence of pulmonic stenosis. Aorta: The aortic root is normal in size and structure. Pulmonary Artery: The pulmonary artery is moderately dilated. Venous: The inferior vena cava is dilated in size with greater than 50% respiratory variability, suggesting right atrial pressure of 8 mmHg. IAS/Shunts: No atrial level shunt detected by color flow Doppler.  LEFT VENTRICLE PLAX 2D LVIDd:         6.10 cm LVIDs:         5.40 cm LV PW:         1.10 cm LV IVS:        1.10 cm LVOT diam:     2.20 cm LV SV:         90 LV SV Index:   49 LVOT Area:     3.80 cm  RIGHT VENTRICLE RV Basal diam:  3.80 cm RV Mid diam:    2.50 cm RV S prime:     11.40 cm/s LEFT ATRIUM              Index         RIGHT ATRIUM           Index LA diam:        4.90  cm  2.68 cm/m    RA Area:     21.70 cm LA Vol (A2C):   185.0 ml 101.22 ml/m  RA Volume:   64.80 ml  35.45 ml/m LA Vol (A4C):   151.0 ml 82.61 ml/m LA Biplane Vol: 174.0 ml 95.20 ml/m  AORTIC VALVE                     PULMONIC VALVE AV Area (Vmax):    3.04 cm      PV Vmax:       0.76 m/s AV Area (Vmean):   2.76 cm      PV Peak grad:  2.3 mmHg AV Area (VTI):  3.52 cm AV Vmax:           152.67 cm/s AV Vmean:          104.800 cm/s AV VTI:            0.257 m AV Peak Grad:      9.3 mmHg AV Mean Grad:      5.7 mmHg LVOT Vmax:         122.00 cm/s LVOT Vmean:        76.200 cm/s LVOT VTI:          0.238 m LVOT/AV VTI ratio: 0.93  AORTA Ao Root diam: 3.50 cm MITRAL VALVE                TRICUSPID VALVE MV Area (PHT): 3.31 cm     TR Peak grad:   42.5 mmHg MV Area VTI:   3.37 cm     TR Vmax:        326.00 cm/s MV Peak grad:  9.6 mmHg MV Mean grad:  4.0 mmHg     SHUNTS MV Vmax:       1.55 m/s     Systemic VTI:  0.24 m MV Vmean:      86.0 cm/s    Systemic Diam: 2.20 cm MV Decel Time: 229 msec MV E velocity: 165.00 cm/s Lorine Bears MD Electronically signed by Lorine Bears MD Signature Date/Time: 08/05/2023/5:19:48 PM    Final    DG Chest Portable 1 View  Result Date: 08/04/2023 CLINICAL DATA:  Altered mental status. EXAM: PORTABLE CHEST 1 VIEW COMPARISON:  December 06, 2017 FINDINGS: Multiple sternal wires are noted. The cardiac silhouette is mildly enlarged and unchanged in size. An artificial cardiac valve is seen. There is mild, stable elevation of the left hemidiaphragm. There is no evidence of an acute infiltrate, pleural effusion or pneumothorax. The visualized skeletal structures are unremarkable. IMPRESSION: 1. Evidence of prior median sternotomy and artificial cardiac valve placement. 2. No acute or active cardiopulmonary disease. Electronically Signed   By: Aram Candela M.D.   On: 08/04/2023 00:58   CT HEAD WO CONTRAST ( )  Result Date: 08/04/2023 CLINICAL DATA:  altered on eliquis, eval  ich EXAM: CT HEAD WITHOUT CONTRAST TECHNIQUE: Contiguous axial images were obtained from the base of the skull through the vertex without intravenous contrast. RADIATION DOSE REDUCTION: This exam was performed according to the departmental dose-optimization program which includes automated exposure control, adjustment of the mA and/or kV according to patient size and/or use of iterative reconstruction technique. COMPARISON:  CT head 05/04/2023, MRA head 05/22/2022, MRI head 06/16/2018 FINDINGS: Brain: Cerebral ventricle sizes are concordant with the degree of cerebral volume loss. Patchy and confluent areas of decreased attenuation are noted throughout the deep and periventricular white matter of the cerebral hemispheres bilaterally, compatible with chronic microvascular ischemic disease. Chronic lacunar left basal ganglia insular region infarctions. Chronic left cerebellar infarction. No evidence of large-territorial acute infarction. No parenchymal hemorrhage. No mass lesion. No extra-axial collection. No mass effect or midline shift. No hydrocephalus. Basilar cisterns are patent. Vascular: No hyperdense vessel. Atherosclerotic calcifications are present within the cavernous internal carotid arteries. Gas noted within the venous system likely due to IV placement. Skull: No acute fracture or focal lesion. Sinuses/Orbits: Paranasal sinuses and mastoid air cells are clear. Bilateral lens replacement. Otherwise the orbits are unremarkable. Other: None. IMPRESSION: No acute intracranial abnormality. Electronically Signed   By: Tish Frederickson M.D.   On: 08/04/2023 00:44    ECHO as  above  TELEMETRY reviewed by me 08/07/2023: atrial fibrillation rate 100-110s  EKG reviewed by me: atrial fibrillation RVR rate 141 bpm  Data reviewed by me 08/07/2023: last 24h vitals tele labs imaging I/O hospitalist progress note  Principal Problem:   Severe sepsis (HCC) Active Problems:   Urinary tract infection   Acute  urinary retention   Acute metabolic encephalopathy   BPH with obstruction/lower urinary tract symptoms   AKI (acute kidney injury) (HCC)   MCI (mild cognitive impairment)   HFrEF (heart failure with reduced ejection fraction) (HCC)   Atrial fibrillation (HCC)   OSA on CPAP   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 male  with a past medical history of ischemic cardiomyopathy (EF 20 to 30%), paroxysmal atrial fibrillation on Eliquis, mitral regurgitation s/p mitral valve repair, CAD s/p CABG x1 in 2001 (SVG to RPDA), hypertension, hyperlipidemia, OSA who presented to the ED on 08/03/2023 for altered mental status, concern for UTI. Noted to be in AF RVR during admission. Cardiology was consulted for further evaluation.   # Atrial fibrillation RVR # Chronic atrial fibrillation Patient with hx of chronic AF presenting with UTI, initially noted to be in AF RVR and hypotensive precluding many rate control therapies. HR and BP now improved.  -Continue midodrine for BP support. Continue po metoprolol tartrate 25 mg twice daily and home digoxin 0.125 mg daily for rate control.  -Continue eliquis 5 mg twice daily for stroke risk reduction. Patient reports strict compliance. -Defer CV today as rate & BP improved, unlikely to be successful given longstanding hx of AF.   # UTI Patient initially presented with altered mental status, UA with rare bacteria and large leuks. Started on antibiotics in the ED. -Management per primary.  # CAD s/p CABG x1 (SVG to RPDA) 2001 # Ischemic cardiomyopathy # Mitral valve regurgitation s/p repair 2001 Patient with significant cardiac history as above, denies any active chest  pain, shortness of breath, lower extremity edema. -Continue to monitor fluid status closely.  Consider restarting home Lasix tomorrow. Further additions to GDMT historically limited by low BP.  -Continue home Zetia 10 mg daily.  Ok for discharge today from a cardiac perspective. Will arrange for follow up in clinic with Dr. Juliann Pares in 1-2 weeks.    This patient's plan of care was discussed and created with Dr. Juliann Pares and he is in agreement.  Signed: Gale Journey, PA-C  08/07/2023, 9:51 AM [facility]

## 2023-08-07 NOTE — Progress Notes (Signed)
Patient is ready for discharge. I have reviewed discharge instructions with the patient/family and answered all questions regarding discharge. Patient's IV's have been removed without complication. Patient has been taken off telemetry. He has all of his belongings. Patient will transported home by his wife and daughter who are currently here with him. He will be taken down to the front of the hospital by wheelchair where he will meet his family to transport him home.

## 2023-08-07 NOTE — Progress Notes (Signed)
Physical Therapy Treatment Patient Details Name: Juan [name] MRN: [id number] DOB: [birth year] Today's Date: 08/07/2023   History of Present Illness Pt is an 82 y.o. male presenting to hospital 08/03/23 with c/o AMS and difficulty voiding.  Pt admitted with UTI, severe sepsis, immunosuppression secondary to long term steroids, BPH with LUTS, acute metabolic encephalopathy, AKI.  PMH includes CAD, CABG, CHF, a-fib, stroke, OSA on CPAP, mild cognitive impairment, remote lacunar infarcts, back sx, bullous pemphigoid.    PT Comments  Pt was awake with RN tech at bedside checking vitals, upon arrival. He is A and O +pleasant and cooperative throughout. Easily and safely demonstrates abilities to exit bed, stand, and ambulate ~ 400 ft. Pt ambulate with and without RW use. Recommend pt use RW until cleared by OPPT/OT otherwise. PT's BP remains stable with HR between 90s and 130s bpm. Pt endorses no symptoms of distress or SOB. " I feel totally fine." Overall pt tolerated session well. Recommend continued skilled PT at DC to maximize his independence and safety with all ADLs.    If plan is discharge home, recommend the following: A little help with walking and/or transfers;A little help with bathing/dressing/bathroom;Assistance with cooking/housework;Assist for transportation;Help with stairs or ramp for entrance     Equipment Recommendations  None recommended by PT       Precautions / Restrictions Precautions Precautions: Fall Precaution Comments: watch HR Restrictions Weight Bearing Restrictions: No     Mobility  Bed Mobility Overal bed mobility: Needs Assistance Bed Mobility: Supine to Sit, Sit to Supine  Supine to sit: Supervision, HOB elevated   Transfers Overall transfer level: Needs assistance Equipment used: Rolling walker (2 wheels), None Transfers: Sit to/from Stand Sit to Stand: Supervision   Ambulation/Gait Ambulation/Gait assistance: Supervision, Contact guard  assist Gait Distance (Feet): 400 Feet Assistive device: None, Rolling walker (2 wheels) Gait Pattern/deviations: Step-through pattern, Trunk flexed Gait velocity: decreased  General Gait Details: no LOB or safety concern during gait with RW. CGA at times without AD. encouraged to use RW until OPPT clears him to return to no AD    Balance Overall balance assessment: Needs assistance Sitting-balance support: No upper extremity supported, Feet supported Sitting balance-Leahy Scale: Good     Standing balance support: Bilateral upper extremity supported, During functional activity, Reliant on assistive device for balance Standing balance-Leahy Scale: Good      Cognition Arousal: Alert Behavior During Therapy: WFL for tasks assessed/performed Overall Cognitive Status: Within Functional Limits for tasks assessed          General Comments General comments (skin integrity, edema, etc.): discussed need to continue to increase activity slowly. Encouraged to get BP/pulse oxi for home monitoring.      Pertinent Vitals/Pain Pain Assessment Pain Assessment: No/denies pain     PT Goals (current goals can now be found in the care plan section) Acute Rehab PT Goals Patient Stated Goal: to improve strength and balance Progress towards PT goals: Progressing toward goals    Frequency    Min 1X/week       AM-PAC PT "6 Clicks" Mobility   Outcome Measure  Help needed turning from your back to your side while in a flat bed without using bedrails?: None Help needed moving from lying on your back to sitting on the side of a flat bed without using bedrails?: A Little Help needed moving to and from a bed to a chair (including a wheelchair)?: A Little Help needed standing up from a chair using your arms (e.g.,  wheelchair or bedside chair)?: A Little Help needed to walk in hospital room?: A Little Help needed climbing 3-5 steps with a railing? : A Little 6 Click Score: 19    End of Session    Activity Tolerance: Patient tolerated treatment well Patient left: in chair;with call bell/phone within reach Nurse Communication: Mobility status PT Visit Diagnosis: Other abnormalities of gait and mobility (R26.89);Muscle weakness (generalized) (M62.81)     Time: 7846-9629 PT Time Calculation (min) (ACUTE ONLY): 24 min  Charges:    $Gait Training: 8-22 mins $Therapeutic Activity: 8-22 mins PT General Charges $$ ACUTE PT VISIT: 1 Visit                    Jetta Lout PTA 08/07/23, 9:00 AM

## 2023-08-07 NOTE — Care Management Important Message (Signed)
Important Message  Patient Details  Name: Juan [name] MRN: [id number] Date of Birth: 05/26/1941   Important Message Given:  Yes - Medicare IM     [name] A [name] 08/07/2023, 9:45 AM

## 2023-08-07 NOTE — Discharge Summary (Signed)
Physician Discharge Summary  Juan [name] ZOX:[id number] DOB: 11-04-1940 DOA: 08/03/2023  PCP: Marguarite Arbour, MD  Admit date: 08/03/2023 Discharge date: 08/07/2023  Admitted From: Home Disposition:  Home  Recommendations for Outpatient Follow-up:  Follow up with PCP in 1-2 weeks Outpatient follow-up with cardiology Outpatient follow-up with urology for voiding trial  Home Health: No Equipment/Devices: Foley catheter with leg bag  Discharge Condition: Stable CODE STATUS: Full Diet recommendation: Heart  Brief/Interim Summary: Juan [name] is a 82 y.o. 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  History is given by wife at bedside who states 3 days ago patient started having difficulty voiding associated with lower abdominal discomfort.  In the past day he became confused beyond his baseline and was very lethargic, sleeping all day.   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 twice daily,  Cardizem 10 mg IV one-time dose given and Lopressor 5 mg IV x 2 doses given Cardiology consulted, initially plan was to do cardioversion but his heart rate is well-controlled, cardiology recommended no cardioversion.  Cleared for discharge.      Urinary tract infection, ruled out Severe sepsis, ruled out Immunocompromised on methotrexate SIRS criteria was met with likely secondary to urinary retention.  Blood cultures no growth.  Urine culture no growth.  No obvious indication of infection to indicate sepsis.  No antibiotics indicated on discharge.  Foley catheter in place at time of DC.  Outpatient follow-up with urology for voiding trial.   Acute urinary retention BPH with LUTS I suspect patient's urinary retention drove her is SIRS criteria.  Foley catheter remains in place at time of discharge.  Can resume home Flomax.  Hold home Myrbetriq and oxybutynin until evaluation by urology.  Outpatient urology appointment set prior to DC.  Appointment 12/5 at 9:15 AM with Dr. Lonna Cobb    Discharge Instructions  Discharge Instructions     Ambulatory referral to Physical Therapy   Complete by: As directed    Call MD for:  difficulty breathing, headache or visual disturbances   Complete by: As directed    Call MD for:  extreme fatigue   Complete by: As directed    Call MD for:  persistant dizziness or light-headedness   Complete by: As directed    Call MD for:  persistant nausea and vomiting   Complete by: As directed    Call MD for:  severe uncontrolled pain   Complete by:  As directed    Call MD for:  temperature >100.4   Complete by: As directed    Diet - low sodium heart healthy   Complete by: As directed    Diet - low sodium heart healthy   Complete by: As directed    Discharge instructions   Complete by: As directed    Follow-up with PCP in 1 week, continue to monitor BP and titrate  medications accordingly. Follow-up with cardiology in 1 to 2 weeks Follow-up with urology if persistent urinary retention.   Increase activity slowly   Complete by: As directed    Increase activity slowly   Complete by: As directed       Allergies as of 08/07/2023       Reactions   Prednisone Other (See Comments)   Developed afib after use.        Medication List     STOP taking these medications    mirabegron ER 50 MG Tb24 tablet Commonly known as: MYRBETRIQ   oxybutynin 5 MG tablet Commonly known as: DITROPAN       TAKE these medications    b complex-C-E-zinc tablet Take 1 tablet by mouth daily.   bisacodyl 5 MG EC tablet Commonly known as: DULCOLAX Take 2 tablets (10 mg total) by mouth at bedtime.   cholecalciferol 1000 units tablet Commonly known as: VITAMIN D Take 2,000 Units by mouth daily.   digoxin 0.125 MG tablet Commonly known as: LANOXIN Take 0.125 mg by mouth daily. lunch   doxycycline 100 MG capsule Commonly known as: VIBRAMYCIN Take 100 mg by mouth at bedtime.   Eliquis 5 MG Tabs tablet Generic drug: apixaban Take 5 mg by mouth 2 (two) times daily.   ezetimibe 10 MG tablet Commonly known as: ZETIA Take 10 mg by mouth at bedtime.   folic acid 1 MG tablet Commonly known as: FOLVITE Take 5 mg by mouth daily.   furosemide 20 MG tablet Commonly known as: LASIX Take 20 mg by mouth daily. lunch   MAGNESIUM GLUCONATE PO Take 350 mg by mouth daily. Take 115mg  tablet daily.   methotrexate 2.5 MG tablet Commonly known as: RHEUMATREX Take 10 mg by mouth once a week.   metoprolol tartrate 25 MG tablet Commonly known as: LOPRESSOR Take 1 tablet (25 mg total) by mouth 2 (two) times daily. What changed: medication strength   polyethylene glycol 17 g packet Commonly known as: MIRALAX / GLYCOLAX Take 17 g by mouth daily.   Potassium 99 MG Tabs Take 99 mg by mouth daily.   selenium sulfide 2.5 % lotion Commonly known as:  SELSUN Apply 1 application topically daily.   tamsulosin 0.4 MG Caps capsule Commonly known as: FLOMAX Take 0.4 mg by mouth.   vitamin E 1000 UNIT capsule Take 1,000 Units by mouth daily. Lunch   Welchol 625 MG tablet Generic drug: colesevelam Take 1,875 mg by mouth 2 (two) times daily.               Durable Medical Equipment  (From admission, onward)           Start     Ordered   08/06/23 1443  For home use only DME Walker rolling  Once       Question Answer Comment  Walker: With 5 Inch Wheels   Patient needs a walker to treat with the following condition Age-related physical debility      08/06/23 1442  Follow-up Information     Marguarite Arbour, MD Follow up in 1 week(s).   Specialty: Internal Medicine Contact information: [address] [facility] [place] [address] [phone]         Alwyn Pea, MD. Go in 1 week(s).   Specialties: Cardiology, Internal Medicine Why: Appointment on Tuesday, 08/13/2023 at 10:45am. Contact information: [address] [place] [address] [phone]         Riki Altes, MD Follow up on 08/29/2023.   Specialty: Urology Why: Dr. Heywood Footman PA Carman Ching has been contacted.  Please contact their office with questions.  You have a visit at 9:15 AM on 12/5 Contact information: [address] [place] [address] [phone]                Allergies  Allergen Reactions   Prednisone Other (See Comments)    Developed afib after use.    Consultations: Cardiology    Procedures/Studies: ECHOCARDIOGRAM COMPLETE  Result Date: 08/05/2023    ECHOCARDIOGRAM REPORT   Patient Name:   Juan [name] Date of Exam: 08/05/2023 Medical Rec #:  [id number]            Height:       69.0 in Accession #:    [id number]           Weight:       149.9 lb Date of Birth:  05/20/1941            BSA:          1.828 m Patient Age:    82  years             BP:           111/67 mmHg Patient Gender: M                    HR:           116 bpm. Exam Location:  [facility] Procedure: 2D Echo, Cardiac Doppler and Color Doppler Indications:     Atrial Fibrillation  History:         Patient has no prior history of Echocardiogram examinations.                  CAD, Prior CABG, Arrythmias:Atrial Fibrillation; Risk                  Factors:Sleep Apnea.  Sonographer:     Mikki Harbor Referring Phys:  [id number] Gillis Santa Diagnosing Phys: Lorine Bears MD IMPRESSIONS  1. Left ventricular ejection fraction, by estimation, is 25 to 30%. The left ventricle has severely decreased function. The left ventricle demonstrates global hypokinesis. The left ventricular internal cavity size was mildly to moderately dilated. There  is mild left ventricular hypertrophy. Left ventricular diastolic parameters are indeterminate.  2. Right ventricular systolic function is normal. The right ventricular size is normal. There is moderately elevated pulmonary artery systolic pressure.  3. Left atrial size was severely dilated.  4. Right atrial size was mildly dilated.  5. The mitral valve is abnormal. No evidence of mitral valve regurgitation. No evidence of mitral stenosis. Severe mitral annular calcification.  6. Tricuspid valve regurgitation is moderate.  7. The aortic valve is normal in structure. Aortic valve regurgitation is mild. Aortic valve sclerosis is present, with no evidence of aortic valve stenosis.  8. Pulmonic valve regurgitation is moderate.  9. Moderately dilated pulmonary artery. 10. The inferior vena cava  is dilated in size with >50% respiratory variability, suggesting right atrial pressure of 8 mmHg. FINDINGS  Left Ventricle: Left ventricular ejection fraction, by estimation, is 25 to 30%. The left ventricle has severely decreased function. The left ventricle demonstrates global hypokinesis. The left ventricular internal cavity size was mildly to moderately dilated.  There is mild left ventricular hypertrophy. Left ventricular diastolic parameters are indeterminate. Right Ventricle: The right ventricular size is normal. No increase in right ventricular wall thickness. Right ventricular systolic function is normal. There is moderately elevated pulmonary artery systolic pressure. The tricuspid regurgitant velocity is 3.26 m/s, and with an assumed right atrial pressure of 8 mmHg, the estimated right ventricular systolic pressure is 50.5 mmHg. Left Atrium: Left atrial size was severely dilated. Right Atrium: Right atrial size was mildly dilated. Pericardium: There is no evidence of pericardial effusion. Mitral Valve: The mitral valve is abnormal. There is moderate thickening of the mitral valve leaflet(s). Severe mitral annular calcification. No evidence of mitral valve regurgitation. No evidence of mitral valve stenosis. MV peak gradient, 9.6 mmHg. The  mean mitral valve gradient is 4.0 mmHg. Tricuspid Valve: The tricuspid valve is normal in structure. Tricuspid valve regurgitation is moderate . No evidence of tricuspid stenosis. Aortic Valve: The aortic valve is normal in structure. Aortic valve regurgitation is mild. Aortic valve sclerosis is present, with no evidence of aortic valve stenosis. Aortic valve mean gradient measures 5.7 mmHg. Aortic valve peak gradient measures 9.3  mmHg. Aortic valve area, by VTI measures 3.52 cm. Pulmonic Valve: The pulmonic valve was normal in structure. Pulmonic valve regurgitation is moderate. No evidence of pulmonic stenosis. Aorta: The aortic root is normal in size and structure. Pulmonary Artery: The pulmonary artery is moderately dilated. Venous: The inferior vena cava is dilated in size with greater than 50% respiratory variability, suggesting right atrial pressure of 8 mmHg. IAS/Shunts: No atrial level shunt detected by color flow Doppler.  LEFT VENTRICLE PLAX 2D LVIDd:         6.10 cm LVIDs:         5.40 cm LV PW:         1.10 cm LV IVS:         1.10 cm LVOT diam:     2.20 cm LV SV:         90 LV SV Index:   49 LVOT Area:     3.80 cm  RIGHT VENTRICLE RV Basal diam:  3.80 cm RV Mid diam:    2.50 cm RV S prime:     11.40 cm/s LEFT ATRIUM              Index         RIGHT ATRIUM           Index LA diam:        4.90 cm  2.68 cm/m    RA Area:     21.70 cm LA Vol (A2C):   185.0 ml 101.22 ml/m  RA Volume:   64.80 ml  35.45 ml/m LA Vol (A4C):   151.0 ml 82.61 ml/m LA Biplane Vol: 174.0 ml 95.20 ml/m  AORTIC VALVE                     PULMONIC VALVE AV Area (Vmax):    3.04 cm      PV Vmax:       0.76 m/s AV Area (Vmean):   2.76 cm      PV Peak grad:  2.3 mmHg AV Area (VTI):     3.52 cm AV Vmax:           152.67 cm/s AV Vmean:          104.800 cm/s AV VTI:            0.257 m AV Peak Grad:      9.3 mmHg AV Mean Grad:      5.7 mmHg LVOT Vmax:         122.00 cm/s LVOT Vmean:        76.200 cm/s LVOT VTI:          0.238 m LVOT/AV VTI ratio: 0.93  AORTA Ao Root diam: 3.50 cm MITRAL VALVE                TRICUSPID VALVE MV Area (PHT): 3.31 cm     TR Peak grad:   42.5 mmHg MV Area VTI:   3.37 cm     TR Vmax:        326.00 cm/s MV Peak grad:  9.6 mmHg MV Mean grad:  4.0 mmHg     SHUNTS MV Vmax:       1.55 m/s     Systemic VTI:  0.24 m MV Vmean:      86.0 cm/s    Systemic Diam: 2.20 cm MV Decel Time: 229 msec MV E velocity: 165.00 cm/s Lorine Bears MD Electronically signed by Lorine Bears MD Signature Date/Time: 08/05/2023/5:19:48 PM    Final    DG Chest Portable 1 View  Result Date: 08/04/2023 CLINICAL DATA:  Altered mental status. EXAM: PORTABLE CHEST 1 VIEW COMPARISON:  December 06, 2017 FINDINGS: Multiple sternal wires are noted. The cardiac silhouette is mildly enlarged and unchanged in size. An artificial cardiac valve is seen. There is mild, stable elevation of the left hemidiaphragm. There is no evidence of an acute infiltrate, pleural effusion or pneumothorax. The visualized skeletal structures are unremarkable. IMPRESSION: 1. Evidence of prior  median sternotomy and artificial cardiac valve placement. 2. No acute or active cardiopulmonary disease. Electronically Signed   By: Aram Candela M.D.   On: 08/04/2023 00:58   CT HEAD WO CONTRAST ( )  Result Date: 08/04/2023 CLINICAL DATA:  altered on eliquis, eval ich EXAM: CT HEAD WITHOUT CONTRAST TECHNIQUE: Contiguous axial images were obtained from the base of the skull through the vertex without intravenous contrast. RADIATION DOSE REDUCTION: This exam was performed according to the departmental dose-optimization program which includes automated exposure control, adjustment of the mA and/or kV according to patient size and/or use of iterative reconstruction technique. COMPARISON:  CT head 05/04/2023, MRA head 05/22/2022, MRI head 06/16/2018 FINDINGS: Brain: Cerebral ventricle sizes are concordant with the degree of cerebral volume loss. Patchy and confluent areas of decreased attenuation are noted throughout the deep and periventricular white matter of the cerebral hemispheres bilaterally, compatible with chronic microvascular ischemic disease. Chronic lacunar left basal ganglia insular region infarctions. Chronic left cerebellar infarction. No evidence of large-territorial acute infarction. No parenchymal hemorrhage. No mass lesion. No extra-axial collection. No mass effect or midline shift. No hydrocephalus. Basilar cisterns are patent. Vascular: No hyperdense vessel. Atherosclerotic calcifications are present within the cavernous internal carotid arteries. Gas noted within the venous system likely due to IV placement. Skull: No acute fracture or focal lesion. Sinuses/Orbits: Paranasal sinuses and mastoid air cells are clear. Bilateral lens replacement. Otherwise the orbits are unremarkable. Other: None. IMPRESSION: No acute intracranial abnormality. Electronically Signed   By: Normajean Glasgow.D.  On: 08/04/2023 00:44      Subjective: Seen and examined on the day of discharge.  Stable no  distress.  Appropriate discharge home.  Discharge Exam: Vitals:   08/07/23 0413 08/07/23 0821  BP: 93/60 111/60  Pulse: 79 76  Resp: 18 16  Temp: 98.3 F (36.8 C) 97.9 F (36.6 C)  SpO2: 95% 95%   Vitals:   08/06/23 2104 08/06/23 2320 08/07/23 0413 08/07/23 0821  BP: 112/65 122/60 93/60 111/60  Pulse: 93 (!) 101 79 76  Resp: 18 18 18 16   Temp: 98 F (36.7 C) 98.2 F (36.8 C) 98.3 F (36.8 C) 97.9 F (36.6 C)  TempSrc: Oral Oral Oral   SpO2: 98% 98% 95% 95%  Weight:        General: Pt is alert, awake, not in acute distress Cardiovascular: RRR, S1/S2 +, no rubs, no gallops Respiratory: CTA bilaterally, no wheezing, no rhonchi Abdominal: Soft, NT, ND, bowel sounds + Extremities: no edema, no cyanosis    The results of significant diagnostics from this hospitalization (including imaging, microbiology, ancillary and laboratory) are listed below for reference.     Microbiology: Recent Results (from the past 240 hour(s))  Urine Culture     Status: None   Collection Time: 08/03/23 11:58 PM   Specimen: Urine, Catheterized  Result Value Ref Range Status   Specimen Description   Final    URINE, CATHETERIZED Performed at [facility], [address], [place], [place] [address]    Special Requests   Final    NONE Performed at [facility], [address], [place], [place] [address]    Culture   Final    NO GROWTH Performed at [facility], [address], [place], [place] [address]    Report Status 08/05/2023 FINAL  Final  Blood Culture (routine x 2)     Status: None (Preliminary result)   Collection Time: 08/04/23  1:18 AM   Specimen: BLOOD  Result Value Ref Range Status   Specimen Description BLOOD BLOOD RIGHT ARM  Final   Special Requests   Final    BOTTLES DRAWN AEROBIC AND ANAEROBIC Blood Culture adequate volume   Culture   Final    NO GROWTH 3 DAYS Performed at [facility], [address] [place], [place] [address]     Report Status PENDING  Incomplete  Blood Culture (routine x 2)     Status: None (Preliminary result)   Collection Time: 08/04/23  1:18 AM   Specimen: BLOOD  Result Value Ref Range Status   Specimen Description BLOOD BLOOD RIGHT ARM  Final   Special Requests   Final    BOTTLES DRAWN AEROBIC AND ANAEROBIC Blood Culture adequate volume   Culture   Final    NO GROWTH 3 DAYS Performed at [facility], [address] [place], [place] [address]    Report Status PENDING  Incomplete     Labs: BNP (last 3 results) No results for input(s): "BNP" in the last 8760 hours. Basic Metabolic Panel: Recent Labs  Lab 08/03/23 2358 08/04/23 0921 08/05/23 0128 08/06/23 0308 08/07/23 0406  NA 127* 131* 131* 133* 131*  K 4.6 3.8 4.5 4.2 4.2  CL 97* 99 100 100 98  CO2 22 24 25 26 25   GLUCOSE 114* 132* 132* 103* 93  BUN 34* 24* 22 16 16   CREATININE 1.41* 1.03 1.03 1.07 0.95  CALCIUM 8.5* 8.4* 8.0* 8.2* 7.8*  MG  --  1.9 2.0  2.1 2.0  PHOS  --  2.7 2.9 2.6 2.9   Liver Function Tests: Recent Labs  Lab 08/03/23 2358 08/04/23 0921 08/05/23 0128 08/06/23 0308 08/07/23 0406  AST 69* 72* 62* 58* 51*  ALT 24 24 22 22 20   ALKPHOS 48 41 42 39 35*  BILITOT 2.1* 1.2* 0.7 <0.2 0.5  PROT 6.3* 5.6* 5.4* 5.1* 5.0*  ALBUMIN 3.7 3.2* 3.0* 2.9* 2.7*   No results for input(s): "LIPASE", "AMYLASE" in the last 168 hours. No results for input(s): "AMMONIA" in the last 168 hours. CBC: Recent Labs  Lab 08/03/23 2358 08/04/23 0921 08/05/23 0128 08/06/23 0308 08/07/23 0406  WBC 10.8* 7.4 6.4 5.9 5.3  NEUTROABS 9.5*  --   --   --   --   HGB 12.2* 11.2* 10.5* 10.5* 10.4*  HCT 35.8* 32.6* 31.2* 30.9* 30.7*  MCV 98.6 96.7 99.7 99.7 99.7  PLT 115* 111* 104* 114* 112*   Cardiac Enzymes: No results for input(s): "CKTOTAL", "CKMB", "CKMBINDEX", "TROPONINI" in the last 168 hours. BNP: Invalid input(s): "POCBNP" CBG: No results for input(s): "GLUCAP" in the last 168 hours. D-Dimer No results  for input(s): "DDIMER" in the last 72 hours. Hgb A1c No results for input(s): "HGBA1C" in the last 72 hours. Lipid Profile No results for input(s): "CHOL", "HDL", "LDLCALC", "TRIG", "CHOLHDL", "LDLDIRECT" in the last 72 hours. Thyroid function studies No results for input(s): "TSH", "T4TOTAL", "T3FREE", "THYROIDAB" in the last 72 hours.  Invalid input(s): "FREET3" Anemia work up No results for input(s): "VITAMINB12", "FOLATE", "FERRITIN", "TIBC", "IRON", "RETICCTPCT" in the last 72 hours. Urinalysis    Component Value Date/Time   COLORURINE AMBER (A) 08/03/2023 2358   APPEARANCEUR TURBID (A) 08/03/2023 2358   LABSPEC 1.012 08/03/2023 2358   PHURINE 8.0 08/03/2023 2358   GLUCOSEU NEGATIVE 08/03/2023 2358   HGBUR MODERATE (A) 08/03/2023 2358   BILIRUBINUR NEGATIVE 08/03/2023 2358   KETONESUR 5 (A) 08/03/2023 2358   PROTEINUR 100 (A) 08/03/2023 2358   NITRITE NEGATIVE 08/03/2023 2358   LEUKOCYTESUR LARGE (A) 08/03/2023 2358   Sepsis Labs Recent Labs  Lab 08/04/23 0921 08/05/23 0128 08/06/23 0308 08/07/23 0406  WBC 7.4 6.4 5.9 5.3   Microbiology Recent Results (from the past 240 hour(s))  Urine Culture     Status: None   Collection Time: 08/03/23 11:58 PM   Specimen: Urine, Catheterized  Result Value Ref Range Status   Specimen Description   Final    URINE, CATHETERIZED Performed at [facility], [address], [place], [place] [address]    Special Requests   Final    NONE Performed at [facility], [address] [place], [place] [address]    Culture   Final    NO GROWTH Performed at [facility], [address], [place], [place] [address]    Report Status 08/05/2023 FINAL  Final  Blood Culture (routine x 2)     Status: None (Preliminary result)   Collection Time: 08/04/23  1:18 AM   Specimen: BLOOD  Result Value Ref Range Status   Specimen Description BLOOD BLOOD RIGHT ARM  Final   Special Requests   Final    BOTTLES DRAWN AEROBIC  AND ANAEROBIC Blood Culture adequate volume   Culture   Final    NO GROWTH 3 DAYS Performed at [facility], [address], [place], [place] [address]    Report Status PENDING  Incomplete  Blood Culture (routine x 2)     Status: None (Preliminary result)  Collection Time: 08/04/23  1:18 AM   Specimen: BLOOD  Result Value Ref Range Status   Specimen Description BLOOD BLOOD RIGHT ARM  Final   Special Requests   Final    BOTTLES DRAWN AEROBIC AND ANAEROBIC Blood Culture adequate volume   Culture   Final    NO GROWTH 3 DAYS Performed at [facility], [address], [place], [place] [address]    Report Status PENDING  Incomplete     Time coordinating discharge: Over 30 minutes  SIGNED:   Tresa Moore, MD  [facility] 08/07/2023, 2:19 PM Pager   If 7PM-7AM, please contact night-coverage

## 2023-08-07 NOTE — Progress Notes (Signed)
[name], male   DOB: February 29, 1941, 82 y.o.   MRN: [id number]    Progress Note from the Palliative Medicine Team at [facility]   Patient Name: Juan [name]        Date: 08/07/2023 DOB: [birth year]  Age: 82 y.o. MRN#: [id number] Attending Physician: Tresa Moore, MD Primary Care Physician: Marguarite Arbour, MD Admit Date: 08/03/2023   Reason for Consultation/Follow-up   Establishing Goals of Care   HPI/ Brief Hospital Review   82 y.o. 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  Patient was admitted to the hospital on 08/04/2023 with sepsis secondary to UTI.  Palliative care consulted to address goals of care     Subjective  Extensive chart review has been completed prior to meeting with patient/family  including labs, vital signs, imaging, progress/consult notes, orders, medications and available advance directive documents.    This NP assessed patient at the bedside as a follow up for palliative medicine needs and emotional support.  Patient is out of bed in the chair enjoying breakfast.  Patient reports feeling well, denies chest pain and is "ready to go home"  His wife and daughter at bedside.    Ongoing education regarding patient's multiple comorbidities and  risk for decompensation.  Wife believes there is some advance care planning documents completed in the past however she is not sure where they are.    Education offered today regarding  the importance of continued conversation with family and their  medical providers regarding overall plan of care and treatment options,  ensuring decisions are within the context of the patients values and GOCs.  Education offered on the importance of documentation, regular review of advance care planning documents.  MOST form introduced and a blank  form was left for review for family.  Questions and concerns addressed   Discussed with primary team, patient may discharge home today.   Time: 35 minutes  Detailed review of medical records ( labs, imaging, vital signs), medically appropriate exam ( MS, skin, cardiac,  resp)   discussed with treatment team, counseling and education to patient, family, staff, documenting clinical information, medication management, coordination of care    Lorinda Creed NP  Palliative Medicine Team Team Phone # [phone] Pager [phone]

## 2023-08-07 NOTE — Progress Notes (Signed)
Heart Failure Navigator Progress Note  Assessed for Heart Failure Education. Patient does not meet criteria due to [facility] patient with Mild Cognitive Impairment and Afib per [facility], PA-C.   Navigator will sign off at this time.  Roxy Horseman, RN, BSN [facility] Heart Failure Navigator Secure Chat Only

## 2023-08-07 NOTE — Plan of Care (Signed)

## 2023-08-07 NOTE — Progress Notes (Signed)
Foley reinserted per day shift RN for urinary retention. Foley d/c'd earlier frpm previous shift. Pt void however has greater than 200  to 300+ post void residual. Foley replaced tolerated well without issues. SRP, RN

## 2023-08-09 LAB — CULTURE, BLOOD (ROUTINE X 2)
Culture: NO GROWTH
Culture: NO GROWTH
Special Requests: ADEQUATE
Special Requests: ADEQUATE

## 2023-08-13 DIAGNOSIS — I255 Ischemic cardiomyopathy: Secondary | ICD-10-CM | POA: Diagnosis not present

## 2023-08-13 DIAGNOSIS — G4733 Obstructive sleep apnea (adult) (pediatric): Secondary | ICD-10-CM | POA: Diagnosis not present

## 2023-08-13 DIAGNOSIS — E7800 Pure hypercholesterolemia, unspecified: Secondary | ICD-10-CM | POA: Diagnosis not present

## 2023-08-13 DIAGNOSIS — I059 Rheumatic mitral valve disease, unspecified: Secondary | ICD-10-CM | POA: Diagnosis not present

## 2023-08-13 DIAGNOSIS — I2510 Atherosclerotic heart disease of native coronary artery without angina pectoris: Secondary | ICD-10-CM | POA: Diagnosis not present

## 2023-08-13 DIAGNOSIS — Z9889 Other specified postprocedural states: Secondary | ICD-10-CM | POA: Diagnosis not present

## 2023-08-13 DIAGNOSIS — G720 Drug-induced myopathy: Secondary | ICD-10-CM | POA: Diagnosis not present

## 2023-08-13 DIAGNOSIS — Z87898 Personal history of other specified conditions: Secondary | ICD-10-CM | POA: Diagnosis not present

## 2023-08-13 DIAGNOSIS — I4891 Unspecified atrial fibrillation: Secondary | ICD-10-CM | POA: Diagnosis not present

## 2023-08-13 DIAGNOSIS — R531 Weakness: Secondary | ICD-10-CM | POA: Diagnosis not present

## 2023-08-13 DIAGNOSIS — I5020 Unspecified systolic (congestive) heart failure: Secondary | ICD-10-CM | POA: Diagnosis not present

## 2023-08-13 DIAGNOSIS — Z951 Presence of aortocoronary bypass graft: Secondary | ICD-10-CM | POA: Diagnosis not present

## 2023-08-14 DIAGNOSIS — N390 Urinary tract infection, site not specified: Secondary | ICD-10-CM | POA: Diagnosis not present

## 2023-08-14 DIAGNOSIS — Z951 Presence of aortocoronary bypass graft: Secondary | ICD-10-CM | POA: Diagnosis not present

## 2023-08-14 DIAGNOSIS — I2510 Atherosclerotic heart disease of native coronary artery without angina pectoris: Secondary | ICD-10-CM | POA: Diagnosis not present

## 2023-08-14 DIAGNOSIS — Z09 Encounter for follow-up examination after completed treatment for conditions other than malignant neoplasm: Secondary | ICD-10-CM | POA: Diagnosis not present

## 2023-08-14 DIAGNOSIS — I482 Chronic atrial fibrillation, unspecified: Secondary | ICD-10-CM | POA: Diagnosis not present

## 2023-08-14 DIAGNOSIS — R319 Hematuria, unspecified: Secondary | ICD-10-CM | POA: Diagnosis not present

## 2023-08-14 DIAGNOSIS — A419 Sepsis, unspecified organism: Secondary | ICD-10-CM | POA: Diagnosis not present

## 2023-08-17 NOTE — H&P (Signed)
History and Physical    Patient: Juan [name] [id number] DOB: [birth year] DOA: 08/03/2023 DOS: the patient was seen and examined on 08/17/2023 PCP: Marguarite Arbour, MD  Patient coming from: Home  Chief Complaint:  Chief Complaint  Patient presents with   Altered Mental Status  HPI: Juan [name] is a 82 y.o. 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  History is given by wife at bedside who states 3 days ago patient started having difficulty voiding associated with lower abdominal discomfort.  In the past day he became confused beyond his baseline and was very lethargic, sleeping all day.   He has had no fever or chills, nausea or vomiting ED course and data review: Afebrile on arrival, tachycardic to 120 with BP initially 129/89 going as low as 98/70, improving with a small LR bolus to 102/73 by admission. Labs: WBC 10,800 with lactic acid 1.9.  Platelets 115,000(baseline 130-140,000, hemoglobin at baseline at 12.2 Creatinine 1.41, up from baseline of 1.08, with sodium 127 Urinalysis with large leukocyte esterase rare bacteria EKG, personally viewed and interpreted showing A-fib at 115 Chest x-ray nonacute Head CT nonacute.   Bladder scan was done in the ED with 600 m and Foley catheter was placed Patient treated with small fluid bolus given low EF, started on ceftriaxone Hospitalist consulted for admission            Past Medical History:  Diagnosis Date   Arthritis      back- low   Atrial fibrillation (HCC)     Cancer (HCC)      skin ( basal) cell , face, head, back, inside L ear   Coronary artery disease     Dysrhythmia     MI (myocardial infarction) (HCC)     Sleep apnea      +Cpap in use nightly , last  study- 10 yrs. ago   Stroke [facility])      "light stroke"             Past Surgical History:  Procedure Laterality Date   APPENDECTOMY       BACK SURGERY   09/2011    lumbar fusion    CARDIAC CATHETERIZATION       CARDIAC SURGERY       CARDIAC VALVE REPLACEMENT       CORONARY ARTERY BYPASS GRAFT       EYE SURGERY Bilateral      catarascts removed-    HERNIA REPAIR Bilateral 1946, 1990's    inguinal x3 procedures    LUMBAR LAMINECTOMY/DECOMPRESSION MICRODISCECTOMY Left 08/14/2019    Procedure: Laminectomy for facet/synovial cyst - left - Lumbar three-Lumbar four;  Surgeon: Julio Sicks, MD;  Location: [facility] OR;  Service: Neurosurgery;  Laterality: Left;   TONSILLECTOMY            Social History:  reports that he has never smoked. He has never used smokeless tobacco. He reports that he does not drink alcohol and does not use drugs.   Allergies       Allergies  Allergen Reactions   Prednisone Other (See Comments)      Developed afib after use.        No family history on file.  Prior to Admission medications   Medication Sig Start Date End Date Taking? Authorizing Provider  apixaban (ELIQUIS) 5 MG TABS tablet Take 5 mg by mouth 2 (two) times daily. 11/13/17     [provider]  B Complex-C-E-Zn (B COMPLEX-C-E-ZINC) tablet Take 1 tablet by mouth daily.       [provider]  cholecalciferol (VITAMIN D) 1000 units tablet Take 2,000 Units by mouth daily.       [provider]  colesevelam (WELCHOL) 625 MG tablet Take 1,875 mg by mouth 2 (two) times daily.  11/26/17     [provider]  cyclobenzaprine (FLEXERIL) 10 MG tablet Take 1 tablet (10 mg total) by mouth 3 (three) times daily as needed for muscle spasms. 08/14/19     Julio Sicks, MD  digoxin (LANOXIN) 0.125 MG tablet Take 0.125 mg by mouth daily. lunch 09/20/17 08/07/19   [provider]  doxycycline (VIBRAMYCIN) 100 MG capsule Take 100 mg by mouth at bedtime.  11/11/17      [provider]  ezetimibe (ZETIA) 10 MG tablet Take 10 mg by mouth at bedtime.  05/28/17     [provider]  furosemide (LASIX) 20 MG tablet Take 20 mg by mouth daily. lunch 09/18/17 08/07/19   [provider]  HYDROcodone-acetaminophen (NORCO/VICODIN) 5-325 MG tablet Take 1 tablet by mouth every 4 (four) hours as needed for moderate pain ((score 4 to 6)). Patient not taking: Reported on 11/16/2020 08/14/19     Julio Sicks, MD  metoprolol tartrate (LOPRESSOR) 50 MG tablet Take 25 mg by mouth 2 (two) times daily. 11/04/17 08/07/19   [provider]  mirabegron ER (MYRBETRIQ) 50 MG TB24 tablet Take 50 mg by mouth daily. Patient not taking: Reported on 11/16/2020 06/27/17     [provider]  selenium sulfide (SELSUN) 2.5 % shampoo Apply 1 application topically daily. 05/26/19     [provider]  tamsulosin (FLOMAX) 0.4 MG CAPS capsule Take 0.4 mg by mouth.       [provider]  vitamin E 1000 UNIT capsule Take 1,000 Units by mouth daily. Lunch       [provider]      Physical Exam:       Vitals:    08/04/23 0000 08/04/23 0100 08/04/23 0130 08/04/23 0200  BP: 98/70 (!) 136/99 104/76 102/73  Pulse: 98 (!) 102 (!) 101 98  Resp: 18 16 14 20   Temp:          SpO2: 100% 100% 98% 100%    Physical Exam Vitals and nursing note reviewed.  Constitutional:      General: He is not in acute distress. HENT:     Head: Normocephalic and atraumatic.  Cardiovascular:     Rate and Rhythm: Regular rhythm. Tachycardia present.     Heart sounds: Normal heart sounds.  Pulmonary:     Effort: Pulmonary effort is normal.     Breath sounds: Normal breath sounds.  Abdominal:     Palpations: Abdomen is soft.     Tenderness: There is no abdominal tenderness.  Neurological:     Mental Status: Mental status is at baseline.       Labs on Admission: I have personally reviewed following labs and imaging studies   CBC: Last Labs      Recent Labs  Lab 08/03/23 2358  WBC 10.8*  NEUTROABS 9.5*  HGB 12.2*  HCT 35.8*  MCV 98.6  PLT 115*  Basic Metabolic Panel: Last Labs     Recent Labs  Lab 08/03/23 2358  NA 127*  K 4.6  CL 97*  CO2 22  GLUCOSE 114*  BUN 34*  CREATININE 1.41*  CALCIUM 8.5*      GFR: CrCl cannot be calculated (Unknown ideal weight.). Liver Function Tests: Last Labs     Recent Labs  Lab 08/03/23 2358  AST 69*  ALT 24  ALKPHOS 48  BILITOT 2.1*  PROT 6.3*  ALBUMIN 3.7      Last Labs  No results for input(s): "LIPASE", "AMYLASE" in the last 168 hours.   Last Labs  No results for input(s): "AMMONIA" in the last 168 hours.   Coagulation Profile: Last Labs  No results for input(s): "INR", "PROTIME" in the last 168 hours.   Cardiac Enzymes: Last Labs  No results for input(s): "CKTOTAL", "CKMB", "CKMBINDEX", "TROPONINI" in the last 168 hours.   BNP (last 3 results) Recent Labs (within last 365 days)  No results for input(s): "PROBNP" in the last 8760 hours.   HbA1C: Recent Labs (last 2 labs)  No results for input(s): "HGBA1C" in the last 72 hours.   CBG: Last Labs  No results for input(s): "GLUCAP" in the last 168 hours.   Lipid Profile: Recent Labs (last 2 labs)  No results for input(s): "CHOL", "HDL", "LDLCALC", "TRIG", "CHOLHDL", "LDLDIRECT" in the last 72 hours.   Thyroid Function Tests: Recent Labs (last 2 labs)  No results for input(s): "TSH", "T4TOTAL", "FREET4", "T3FREE", "THYROIDAB" in the last 72 hours.   Anemia Panel: Recent Labs (last 2 labs)  No results for input(s): "VITAMINB12", "FOLATE", "FERRITIN", "TIBC", "IRON", "RETICCTPCT" in the last 72 hours.   Urine analysis: Labs (Brief)          Component Value Date/Time    COLORURINE AMBER (A) 08/03/2023 2358    APPEARANCEUR TURBID (A) 08/03/2023 2358    LABSPEC 1.012 08/03/2023 2358    PHURINE 8.0 08/03/2023 2358    GLUCOSEU NEGATIVE 08/03/2023 2358    HGBUR MODERATE (A) 08/03/2023 2358     BILIRUBINUR NEGATIVE 08/03/2023 2358    KETONESUR 5 (A) 08/03/2023 2358    PROTEINUR 100 (A) 08/03/2023 2358    NITRITE NEGATIVE 08/03/2023 2358    LEUKOCYTESUR LARGE (A) 08/03/2023 2358        Radiological Exams on Admission:  Imaging Results (Last 48 hours)  DG Chest Portable 1 View   Result Date: 08/04/2023 CLINICAL DATA:  Altered mental status. EXAM: PORTABLE CHEST 1 VIEW COMPARISON:  December 06, 2017 FINDINGS: Multiple sternal wires are noted. The cardiac silhouette is mildly enlarged and unchanged in size. An artificial cardiac valve is seen. There is mild, stable elevation of the left hemidiaphragm. There is no evidence of an acute infiltrate, pleural effusion or pneumothorax. The visualized skeletal structures are unremarkable. IMPRESSION: 1. Evidence of prior median sternotomy and artificial cardiac valve placement. 2. No acute or active cardiopulmonary disease. Electronically Signed   By: Aram Candela M.D.   On: 08/04/2023 00:58    CT HEAD WO CONTRAST ( )   Result Date: 08/04/2023 CLINICAL DATA:  altered on eliquis, eval ich EXAM: CT HEAD WITHOUT CONTRAST TECHNIQUE: Contiguous axial images were obtained from the base of the skull through the vertex without intravenous contrast. RADIATION DOSE REDUCTION: This exam was performed according to the departmental dose-optimization program which includes automated exposure control, adjustment of the mA and/or kV according to patient size and/or use of iterative reconstruction technique. COMPARISON:  CT head  05/04/2023, MRA head 05/22/2022, MRI head 06/16/2018 FINDINGS: Brain: Cerebral ventricle sizes are concordant with the degree of cerebral volume loss. Patchy and confluent areas of decreased attenuation are noted throughout the deep and periventricular white matter of the cerebral hemispheres bilaterally, compatible with chronic microvascular ischemic disease. Chronic lacunar left basal ganglia insular region infarctions. Chronic  left cerebellar infarction. No evidence of large-territorial acute infarction. No parenchymal hemorrhage. No mass lesion. No extra-axial collection. No mass effect or midline shift. No hydrocephalus. Basilar cisterns are patent. Vascular: No hyperdense vessel. Atherosclerotic calcifications are present within the cavernous internal carotid arteries. Gas noted within the venous system likely due to IV placement. Skull: No acute fracture or focal lesion. Sinuses/Orbits: Paranasal sinuses and mastoid air cells are clear. Bilateral lens replacement. Otherwise the orbits are unremarkable. Other: None. IMPRESSION: No acute intracranial abnormality. Electronically Signed   By: Tish Frederickson M.D.   On: 08/04/2023 00:44         Data Reviewed: Relevant notes from primary care and specialist visits, past discharge summaries as available in EHR, including Care Everywhere. Prior diagnostic testing as pertinent to current admission diagnoses Updated medications and problem lists for reconciliation ED course, including vitals, labs, imaging, treatment and response to treatment Triage notes, nursing and pharmacy notes and ED provider's notes Notable results as noted in HPI     Assessment and Plan: Urinary tract infection Severe sepsis Immunosuppression secondary to long-term steroids Sepsis criteria include tachycardia, tachypnea, hypotension, leukocytosis and UTI, organ dysfunction with AKI, metabolic encephalopathy and hypotension Cautious sepsis fluid resuscitation given HFrEF with EF 25% Continue Rocephin Follow cultures Will hold prednisone 10 mg in the setting of severe sepsis   Acute urinary retention BPH with LUTS Bladder scan revealed 600 mL urine Continue Foley Continue home tamsulosin and Myrbetriq Voiding trial per protocol   Acute metabolic encephalopathy Mild cognitive impairment Delirium precautions   AKI (acute kidney injury) (HCC) Secondary to ATN from sepsis Creatinine 1.41 up  from baseline of 1.08 Expecting improvement with hydration Monitor renal function and avoid nephrotoxins   Coronary artery disease with history of CABG No complaints of chest pain and EKG nonacute Holding metoprolol due to soft blood pressure, to resume as appropriate   HFrEF (heart failure with reduced ejection fraction) (HCC) History of mitral valve repair Clinically euvolemic to dry Due to soft blood pressure will hold metoprolol, furosemide until able to resume Continue digoxin   Atrial fibrillation (HCC) Chronic anticoagulation Continue Eliquis   BP (bullous pemphigoid) Long-term steroids Not currently active.  Followed by [facility] dermatology, last seen 05/2023 On long-term prednisone, methotrexate and folic acid Most recent flare was 2 months prior when prednisone was uptitrated from 5 mg to 10 mg(per wife)   Chronic anemia Hemoglobin at baseline at 12.2   Thrombocytopenia (HCC) Chronic thrombocytopenia.  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 Inpatient status is judged to be reasonable and necessary in order to provide the required intensity of service to ensure the patient's safety. The patient's presenting symptoms, physical exam findings, and initial radiographic and laboratory data in the context of their chronic comorbidities  is felt to place them at high risk for further clinical deterioration. Furthermore, it is not anticipated that the patient will be medically stable for discharge from the hospital within 2 midnights of admission.    * I certify that at the point of admission  it is my clinical judgment that the patient will require inpatient hospital care spanning beyond 2 midnights from the point of admission due to high intensity of service, high risk for further deterioration and high frequency of surveillance required.*   Author: Andris Baumann, MD 08/04/2023 2:40 AM   For on call review [email or web address].

## 2023-08-27 DIAGNOSIS — Z951 Presence of aortocoronary bypass graft: Secondary | ICD-10-CM | POA: Diagnosis not present

## 2023-08-27 DIAGNOSIS — I5020 Unspecified systolic (congestive) heart failure: Secondary | ICD-10-CM | POA: Diagnosis not present

## 2023-08-29 ENCOUNTER — Encounter: Payer: Self-pay | Admitting: Physician Assistant

## 2023-08-29 ENCOUNTER — Ambulatory Visit (INDEPENDENT_AMBULATORY_CARE_PROVIDER_SITE_OTHER): Payer: PPO | Admitting: Urology

## 2023-08-29 ENCOUNTER — Encounter: Payer: Self-pay | Admitting: Urology

## 2023-08-29 ENCOUNTER — Ambulatory Visit: Payer: PPO | Admitting: Physician Assistant

## 2023-08-29 VITALS — BP 106/69 | HR 87 | Ht 69.0 in | Wt 133.0 lb

## 2023-08-29 VITALS — BP 130/74 | HR 84 | Ht 72.0 in | Wt 124.0 lb

## 2023-08-29 DIAGNOSIS — R338 Other retention of urine: Secondary | ICD-10-CM

## 2023-08-29 DIAGNOSIS — N401 Enlarged prostate with lower urinary tract symptoms: Secondary | ICD-10-CM | POA: Diagnosis not present

## 2023-08-29 LAB — BLADDER SCAN AMB NON-IMAGING: Scan Result: 319

## 2023-08-29 MED ORDER — SILODOSIN 8 MG PO CAPS: 8.0000 mg | ORAL_CAPSULE | Freq: Every day | ORAL | 2 refills | Status: DC

## 2023-08-29 NOTE — Progress Notes (Signed)
Catheter Removal  A 16FR foley cath was removed from the bladder no complications were noted .   Patient educated to drink water and move around to help aid urination.  Performed by: S.Kameisha Malicki  Follow up/ Additional notes: 08/29/2023 at 330pm

## 2023-08-29 NOTE — Progress Notes (Signed)
I, Juan [name], acting as a scribe for Juan Altes, MD., have documented all relevant documentation on the behalf of Juan Altes, MD, as directed by Juan Altes, MD while in the presence of Juan Altes, MD.  08/29/2023 3:54 PM   Juan [name] [birth year] [id number]  Referring provider: Marguarite Arbour, MD [address] [facility] [place],  [place] [address]   HPI: Juan [name] is a 82 y.o. male presents in follow-up of recent hospitalization, where he was found to have urinary retention.   Long history of BPH followed by Dr. Judithann [name] on tamsulosin.  Around September he was taken off of tamsulosin due to low blood pressure. He was admitted to [facility] 08/04/23 with complaints of a 3 day history of voiding difficulty associated with lower abdominal discomfort and increased confusion. Urinalysis showed microhematuria and pyuria, however urine culture was negative. A Foley catheter was placed when bladder scan showed volume of 600 mL. He was started back on Juan tamsulosin.  He presented this morning and Juan Foley catheter was removed. He has voided and has no sensation of bladder fullness. PVR was approximately 300 mL.   PMH: Past Medical History:  Diagnosis Date   Arthritis    back- low   Atrial fibrillation (HCC)    Cancer (HCC)    skin ( basal) cell , face, head, back, inside L ear   Coronary artery disease    Dysrhythmia    MI (myocardial infarction) (HCC)    Sleep apnea    +Cpap in use nightly , last study- 10 yrs. ago   Stroke [facility])    "light stroke"    Surgical History: Past Surgical History:  Procedure Laterality Date   APPENDECTOMY     BACK SURGERY  09/2011   lumbar fusion    CARDIAC CATHETERIZATION     CARDIAC SURGERY     CARDIAC VALVE REPLACEMENT     CORONARY ARTERY BYPASS GRAFT     EYE SURGERY Bilateral    catarascts removed-    HERNIA REPAIR Bilateral 1946, 1990's   inguinal x3 procedures    LUMBAR  LAMINECTOMY/DECOMPRESSION MICRODISCECTOMY Left 08/14/2019   Procedure: Laminectomy for facet/synovial cyst - left - Lumbar three-Lumbar four;  Surgeon: Juan Sicks, MD;  Location: [facility] OR;  Service: Neurosurgery;  Laterality: Left;   TONSILLECTOMY      Home Medications:  Allergies as of 08/29/2023       Reactions   Prednisone Other (See Comments)   Developed afib after use.        Medication List        Accurate as of August 29, 2023  3:54 PM. If you have any questions, ask your nurse or doctor.          b complex-C-E-zinc tablet Take 1 tablet by mouth daily.   bisacodyl 5 MG EC tablet Commonly known as: DULCOLAX Take 2 tablets (10 mg total) by mouth at bedtime.   cholecalciferol 1000 units tablet Commonly known as: VITAMIN D Take 2,000 Units by mouth daily.   digoxin 0.125 MG tablet Commonly known as: LANOXIN Take 0.125 mg by mouth daily. lunch   doxycycline 100 MG capsule Commonly known as: VIBRAMYCIN Take 100 mg by mouth at bedtime.   Eliquis 5 MG Tabs tablet Generic drug: apixaban Take 5 mg by mouth 2 (two) times daily.   ezetimibe 10 MG tablet Commonly known as: ZETIA Take 10 mg by mouth at bedtime.  folic acid 1 MG tablet Commonly known as: FOLVITE Take 5 mg by mouth daily.   furosemide 20 MG tablet Commonly known as: LASIX Take 20 mg by mouth daily. lunch   MAGNESIUM GLUCONATE PO Take 350 mg by mouth daily. Take 115mg  tablet daily.   methotrexate 2.5 MG tablet Commonly known as: RHEUMATREX Take 10 mg by mouth once a week.   metoprolol tartrate 25 MG tablet Commonly known as: LOPRESSOR Take 1 tablet (25 mg total) by mouth 2 (two) times daily.   polyethylene glycol 17 g packet Commonly known as: MIRALAX / GLYCOLAX Take 17 g by mouth daily.   Potassium 99 MG Tabs Take 99 mg by mouth daily.   selenium sulfide 2.5 % lotion Commonly known as: SELSUN Apply 1 application topically daily.   silodosin 8 MG Caps capsule Commonly known as:  RAPAFLO Take 1 capsule (8 mg total) by mouth daily with breakfast. Started by: Juan [name]   tamsulosin 0.4 MG Caps capsule Commonly known as: FLOMAX Take 0.4 mg by mouth.   vitamin E 1000 UNIT capsule Take 1,000 Units by mouth daily. Lunch   Welchol 625 MG tablet Generic drug: colesevelam Take 1,875 mg by mouth 2 (two) times daily.        Allergies:  Allergies  Allergen Reactions   Prednisone Other (See Comments)    Developed afib after use.    Social History:  reports that he has never smoked. He has never used smokeless tobacco. He reports that he does not drink alcohol and does not use drugs.   Physical Exam: BP 130/74   Pulse 84   Ht 6' (1.829 m)   Wt 124 lb (56.2 kg)   BMI 16.82 kg/m   Constitutional:  Alert and oriented, No acute distress. HEENT: [place] AT Respiratory: Normal respiratory effort, no increased work of breathing. Psychiatric: Normal mood and affect.   Assessment & Plan:    1. Incomplete bladder emptying No prior urological evaluation and Juan baseline is not known, however I suspect he has a chronic residual, had a urinalysis performed September 2024 showed pyuria.  I discussed with Juan [name] and Juan [name], Juan catheter could be replaced this evening and Juan medication could be changed with a repeat voiding trial in 7-10 days.  Since Juan residual is borderline and he is asymptomatic, I also gave them the option of returning to the AM for a repeat bladder scan, and they have elected the latter.  Will switch to a prostate-specific alpha blocker and Rx silodosin 8mg  into pharmacy.  If no significant increase in Juan residual in AM, will see back in 1 month for a repeat PVR.   I have reviewed the above documentation for accuracy and completeness, and I agree with the above.   Juan Altes, MD  [facility] [address], [address] [place], [place] [address] [phone]

## 2023-08-30 ENCOUNTER — Ambulatory Visit: Payer: PPO | Admitting: Physician Assistant

## 2023-08-30 DIAGNOSIS — R338 Other retention of urine: Secondary | ICD-10-CM | POA: Diagnosis not present

## 2023-08-30 LAB — URINALYSIS, COMPLETE
Bilirubin, UA: NEGATIVE
Glucose, UA: NEGATIVE
Ketones, UA: NEGATIVE
Leukocytes,UA: NEGATIVE
Nitrite, UA: NEGATIVE
Protein,UA: NEGATIVE
RBC, UA: NEGATIVE
Specific Gravity, UA: 1.01 (ref 1.005–1.030)
Urobilinogen, Ur: 0.2 mg/dL (ref 0.2–1.0)
pH, UA: 6 (ref 5.0–7.5)

## 2023-08-30 LAB — BLADDER SCAN AMB NON-IMAGING: Scan Result: 700

## 2023-08-30 LAB — MICROSCOPIC EXAMINATION

## 2023-08-30 NOTE — Progress Notes (Signed)
08/30/2023 11:02 AM   Juan [name] 1941-03-17 [id number]  CC: Chief Complaint  Patient presents with   Urinary Retention   HPI: Juan [name] is a 82 y.o. male with PMH BPH previously on Flomax and a recent history of urinary retention who had an equivocal voiding trial yesterday who presents today for repeat PVR. He is accompanied today by his wife, who contributes to HPI.   Today he reports several small volume voids overnight. He denies abdominal pain this morning, but reports feeling tired and shaky.  He has not yet started Rapaflo as prescribed by Dr. Lonna Cobb yesterday.  His wife reports he underwent an unknown urethral surgery with Dr. Evelene Croon around 1994, possibly reconstructive in nature.  Bladder scan on arrival .  PMH: Past Medical History:  Diagnosis Date   Arthritis    back- low   Atrial fibrillation (HCC)    Cancer (HCC)    skin ( basal) cell , face, head, back, inside L ear   Coronary artery disease    Dysrhythmia    MI (myocardial infarction) (HCC)    Sleep apnea    +Cpap in use nightly , last study- 10 yrs. ago   Stroke [facility])    "light stroke"    Surgical History: Past Surgical History:  Procedure Laterality Date   APPENDECTOMY     BACK SURGERY  09/2011   lumbar fusion    CARDIAC CATHETERIZATION     CARDIAC SURGERY     CARDIAC VALVE REPLACEMENT     CORONARY ARTERY BYPASS GRAFT     EYE SURGERY Bilateral    catarascts removed-    HERNIA REPAIR Bilateral 1946, 1990's   inguinal x3 procedures    LUMBAR LAMINECTOMY/DECOMPRESSION MICRODISCECTOMY Left 08/14/2019   Procedure: Laminectomy for facet/synovial cyst - left - Lumbar three-Lumbar four;  Surgeon: Julio Sicks, MD;  Location: [facility] OR;  Service: Neurosurgery;  Laterality: Left;   TONSILLECTOMY      Home Medications:  Allergies as of 08/30/2023       Reactions   Prednisone Other (See Comments)   Developed afib after use.        Medication List        Accurate as of  August 30, 2023 11:02 AM. If you have any questions, ask your nurse or doctor.          b complex-C-E-zinc tablet Take 1 tablet by mouth daily.   bisacodyl 5 MG EC tablet Commonly known as: DULCOLAX Take 2 tablets (10 mg total) by mouth at bedtime.   cholecalciferol 1000 units tablet Commonly known as: VITAMIN D Take 2,000 Units by mouth daily.   digoxin 0.125 MG tablet Commonly known as: LANOXIN Take 0.125 mg by mouth daily. lunch   doxycycline 100 MG capsule Commonly known as: VIBRAMYCIN Take 100 mg by mouth at bedtime.   Eliquis 5 MG Tabs tablet Generic drug: apixaban Take 5 mg by mouth 2 (two) times daily.   ezetimibe 10 MG tablet Commonly known as: ZETIA Take 10 mg by mouth at bedtime.   folic acid 1 MG tablet Commonly known as: FOLVITE Take 5 mg by mouth daily.   furosemide 20 MG tablet Commonly known as: LASIX Take 20 mg by mouth daily. lunch   MAGNESIUM GLUCONATE PO Take 350 mg by mouth daily. Take 115mg  tablet daily.   methotrexate 2.5 MG tablet Commonly known as: RHEUMATREX Take 10 mg by mouth once a week.   metoprolol tartrate 25 MG tablet Commonly known as:  LOPRESSOR Take 1 tablet (25 mg total) by mouth 2 (two) times daily.   polyethylene glycol 17 g packet Commonly known as: MIRALAX / GLYCOLAX Take 17 g by mouth daily.   Potassium 99 MG Tabs Take 99 mg by mouth daily.   selenium sulfide 2.5 % lotion Commonly known as: SELSUN Apply 1 application topically daily.   silodosin 8 MG Caps capsule Commonly known as: RAPAFLO Take 1 capsule (8 mg total) by mouth daily with breakfast.   tamsulosin 0.4 MG Caps capsule Commonly known as: FLOMAX Take 0.4 mg by mouth.   vitamin E 1000 UNIT capsule Take 1,000 Units by mouth daily. Lunch   Welchol 625 MG tablet Generic drug: colesevelam Take 1,875 mg by mouth 2 (two) times daily.        Allergies:  Allergies  Allergen Reactions   Prednisone Other (See Comments)    Developed afib  after use.    Family History: No family history on file.  Social History:   reports that he has never smoked. He has never used smokeless tobacco. He reports that he does not drink alcohol and does not use drugs.  Physical Exam: There were no vitals taken for this visit.  Constitutional:  Alert and oriented, no acute distress, nontoxic appearing HEENT: [place], AT Cardiovascular: No clubbing, cyanosis, or edema Respiratory: Normal respiratory effort, no increased work of breathing GU: Hypospadias, unclear if congenital or secondary to prior urethral surgery Skin: No rashes, bruises or suspicious lesions Neurologic: Grossly intact, no focal deficits, moving all 4 extremities Psychiatric: Normal mood and affect  Laboratory Data: Results for orders placed or performed in visit on 08/30/23  Microscopic Examination   Urine  Result Value Ref Range   WBC, UA 0-5 0 - 5 /hpf   RBC, Urine 0-2 0 - 2 /hpf   Epithelial Cells (non renal) 0-10 0 - 10 /hpf   Bacteria, UA Few None seen/Few  Urinalysis, Complete  Result Value Ref Range   Specific Gravity, UA 1.010 1.005 - 1.030   pH, UA 6.0 5.0 - 7.5   Color, UA Yellow Yellow   Appearance Ur Clear Clear   Leukocytes,UA Negative Negative   Protein,UA Negative Negative/Trace   Glucose, UA Negative Negative   Ketones, UA Negative Negative   RBC, UA Negative Negative   Bilirubin, UA Negative Negative   Urobilinogen, Ur 0.2 0.2 - 1.0 mg/dL   Nitrite, UA Negative Negative   Microscopic Examination See below:   Bladder Scan (Post Void Residual) in office  Result Value Ref Range   Scan Result 700    Simple Catheter Placement  Due to urinary retention patient is present today for a foley cath placement.  Patient was cleaned and prepped in a sterile fashion with betadine and 2% lidocaine jelly was instilled into the urethra. A 16 FR coude foley catheter was inserted, urine return was noted  , urine was yellow in color.  The balloon was filled  with 10cc of sterile water.  A leg bag was attached for drainage. Patient was given instruction on proper catheter care.  Patient tolerated well, no complications were noted.  Performed by: Carman Ching, PA-C   Assessment & Plan:   1. Acute urinary retention Bladder scan significantly elevated over prior.  Voiding trial failed.  I offered him Foley catheter replacement and he accepted, see above.  We discussed repeating a voiding trial on silodosin in 2 weeks versus proceeding directly to cystoscopy TRUS for consideration of outlet procedures.  They  prefer the former, which is reasonable.  If he has a second failed voiding trial, will pursue cystoscopy TRUS.  Urine sample obtained from Foley today and resulted after he left, which was bland.  We contacted them via telephone to let them know that no antibiotics are required. - Bladder Scan (Post Void Residual) in office - Urinalysis, Complete - CULTURE, URINE COMPREHENSIVE  Return in about 11 days (around 09/10/2023) for Voiding trial.  Carman Ching, PA-C  [facility] [address] [place], [place] [address] [phone]

## 2023-09-02 LAB — CULTURE, URINE COMPREHENSIVE

## 2023-09-10 ENCOUNTER — Ambulatory Visit: Payer: PPO | Admitting: Physician Assistant

## 2023-09-10 ENCOUNTER — Encounter: Payer: Self-pay | Admitting: Physician Assistant

## 2023-09-10 DIAGNOSIS — N400 Enlarged prostate without lower urinary tract symptoms: Secondary | ICD-10-CM | POA: Diagnosis not present

## 2023-09-10 DIAGNOSIS — R339 Retention of urine, unspecified: Secondary | ICD-10-CM

## 2023-09-10 LAB — BLADDER SCAN AMB NON-IMAGING

## 2023-09-10 NOTE — Progress Notes (Unsigned)
09/11/2023 9:28 PM   Juan [name] 1941-01-26 [id number]  Referring provider: Marguarite Arbour, MD [address] [facility] [place],  [place] [address]  Urological history: 1. Urinary retention -admitted in 07/2023 after discontinuing tamsulosin secondary to low BP  -failed TOV 08/30/2023  2. BPH  -silodosin 8 mg daily   3. Urethral stricture -? Urethral surgery w/ Dr. Evelene Croon (1994)   No chief complaint on file.  HPI: Juan [name] is a 82 y.o. male who presents today for PVR.  Previous records reviewed.   PVR ***  PMH: Past Medical History:  Diagnosis Date   Arthritis    back- low   Atrial fibrillation (HCC)    Cancer (HCC)    skin ( basal) cell , face, head, back, inside L ear   Coronary artery disease    Dysrhythmia    MI (myocardial infarction) (HCC)    Sleep apnea    +Cpap in use nightly , last study- 10 yrs. ago   Stroke [facility])    "light stroke"    Surgical History: Past Surgical History:  Procedure Laterality Date   APPENDECTOMY     BACK SURGERY  09/2011   lumbar fusion    CARDIAC CATHETERIZATION     CARDIAC SURGERY     CARDIAC VALVE REPLACEMENT     CORONARY ARTERY BYPASS GRAFT     EYE SURGERY Bilateral    catarascts removed-    HERNIA REPAIR Bilateral 1946, 1990's   inguinal x3 procedures    LUMBAR LAMINECTOMY/DECOMPRESSION MICRODISCECTOMY Left 08/14/2019   Procedure: Laminectomy for facet/synovial cyst - left - Lumbar three-Lumbar four;  Surgeon: Julio Sicks, MD;  Location: [facility] OR;  Service: Neurosurgery;  Laterality: Left;   TONSILLECTOMY      Home Medications:  Allergies as of 09/11/2023       Reactions   Prednisone Other (See Comments)   Developed afib after use.        Medication List        Accurate as of September 10, 2023  9:28 PM. If you have any questions, ask your nurse or doctor.          b complex-C-E-zinc tablet Take 1 tablet by mouth daily.   bisacodyl 5 MG EC tablet Commonly  known as: DULCOLAX Take 2 tablets (10 mg total) by mouth at bedtime.   cholecalciferol 1000 units tablet Commonly known as: VITAMIN D Take 2,000 Units by mouth daily.   doxycycline 100 MG capsule Commonly known as: VIBRAMYCIN Take 100 mg by mouth at bedtime.   Eliquis 5 MG Tabs tablet Generic drug: apixaban Take 5 mg by mouth 2 (two) times daily.   ezetimibe 10 MG tablet Commonly known as: ZETIA Take 10 mg by mouth at bedtime.   folic acid 1 MG tablet Commonly known as: FOLVITE Take 5 mg by mouth daily.   MAGNESIUM GLUCONATE PO Take 350 mg by mouth daily. Take 115mg  tablet daily.   methotrexate 2.5 MG tablet Commonly known as: RHEUMATREX Take 10 mg by mouth once a week.   metoprolol tartrate 25 MG tablet Commonly known as: LOPRESSOR Take 1 tablet (25 mg total) by mouth 2 (two) times daily.   polyethylene glycol 17 g packet Commonly known as: MIRALAX / GLYCOLAX Take 17 g by mouth daily.   Potassium 99 MG Tabs Take 99 mg by mouth daily.   selenium sulfide 2.5 % lotion Commonly known as: SELSUN Apply 1 application topically daily.   silodosin 8  MG Caps capsule Commonly known as: RAPAFLO Take 1 capsule (8 mg total) by mouth daily with breakfast.   tamsulosin 0.4 MG Caps capsule Commonly known as: FLOMAX Take 0.4 mg by mouth.   vitamin E 1000 UNIT capsule Take 1,000 Units by mouth daily. Lunch   Welchol 625 MG tablet Generic drug: colesevelam Take 1,875 mg by mouth 2 (two) times daily.        Allergies:  Allergies  Allergen Reactions   Prednisone Other (See Comments)    Developed afib after use.    Family History: No family history on file.  Social History:  reports that he has never smoked. He has never been exposed to tobacco smoke. He has never used smokeless tobacco. He reports that he does not drink alcohol and does not use drugs.  ROS: Pertinent ROS in HPI  Physical Exam: There were no vitals taken for this visit.  Constitutional:   Well nourished. Alert and oriented, No acute distress. HEENT: [place] AT, moist mucus membranes.  Trachea midline, no masses. Cardiovascular: No clubbing, cyanosis, or edema. Respiratory: Normal respiratory effort, no increased work of breathing. GI: Abdomen is soft, non tender, non distended, no abdominal masses. Liver and spleen not palpable.  No hernias appreciated.  Stool sample for occult testing is not indicated.   GU: No CVA tenderness.  No bladder fullness or masses.  Patient with circumcised/uncircumcised phallus. ***Foreskin easily retracted***  Urethral meatus is patent.  No penile discharge. No penile lesions or rashes. Scrotum without lesions, cysts, rashes and/or edema.  Testicles are located scrotally bilaterally. No masses are appreciated in the testicles. Left and right epididymis are normal. Rectal: Patient with  normal sphincter tone. Anus and perineum without scarring or rashes. No rectal masses are appreciated. Prostate is approximately *** grams, *** nodules are appreciated. Seminal vesicles are normal. Skin: No rashes, bruises or suspicious lesions. Lymph: No cervical or inguinal adenopathy. Neurologic: Grossly intact, no focal deficits, moving all 4 extremities. Psychiatric: Normal mood and affect.  Laboratory Data: Lab Results  Component Value Date   WBC 5.3 08/07/2023   HGB 10.4 (L) 08/07/2023   HCT 30.7 (L) 08/07/2023   MCV 99.7 08/07/2023   PLT 112 (L) 08/07/2023    Lab Results  Component Value Date   CREATININE 0.95 08/07/2023    Lab Results  Component Value Date   AST 51 (H) 08/07/2023   Lab Results  Component Value Date   ALT 20 08/07/2023   Urinalysis See EPIC and HPI  I have reviewed the labs.   Pertinent Imaging: ***  Assessment & Plan:  ***  1. Acute urinary retention:    - foley catheter removed yesterday   2. BPH with LU TS/retention -continue silodosin 8 mg daily  -has cysto/TRUS with Dr. Lonna Cobb in January   No follow-ups on  file.  These notes generated with voice recognition software. I apologize for typographical errors.  Cloretta Ned  [facility] [address]  [address] [place], [place] [address] [phone]

## 2023-09-10 NOTE — Progress Notes (Signed)
[name], male   DOB: May 27, 1941, 82 y.o.   MRN: [id number] Catheter Removal  Patient is present today for a catheter removal.  8ml of water was drained from the balloon. A 16FR foley cath was removed from the bladder, no complications were noted. Patient tolerated well.  Performed by: Mervin Hack, CMA  Follow up/ Additional notes: this afternoon at 3:20pm

## 2023-09-10 NOTE — Patient Instructions (Signed)

## 2023-09-10 NOTE — Progress Notes (Signed)
09/10/2023 4:31 PM   Juan [name] [birth year] [id number]  CC: Chief Complaint  Patient presents with   Follow-up   Urinary Retention   HPI: Juan [name] is a 82 y.o. male with PMH remote urethral surgery, BPH with unclear baseline residual, and a recent history of urinary retention now on silodosin who presents today for repeat voiding trial.   Foley catheter removed in the morning, see separate procedure note for details.  He returned to clinic in the afternoon for PVR.  He has voided twice, 150 mL out each time, without difficulty.  He denies abdominal discomfort or the urge to void.  PVR 416 mL.  PMH: Past Medical History:  Diagnosis Date   Arthritis    back- low   Atrial fibrillation (HCC)    Cancer (HCC)    skin ( basal) cell , face, head, back, inside L ear   Coronary artery disease    Dysrhythmia    MI (myocardial infarction) (HCC)    Sleep apnea    +Cpap in use nightly , last study- 10 yrs. ago   Stroke [facility])    "light stroke"    Surgical History: Past Surgical History:  Procedure Laterality Date   APPENDECTOMY     BACK SURGERY  09/2011   lumbar fusion    CARDIAC CATHETERIZATION     CARDIAC SURGERY     CARDIAC VALVE REPLACEMENT     CORONARY ARTERY BYPASS GRAFT     EYE SURGERY Bilateral    catarascts removed-    HERNIA REPAIR Bilateral 1946, 1990's   inguinal x3 procedures    LUMBAR LAMINECTOMY/DECOMPRESSION MICRODISCECTOMY Left 08/14/2019   Procedure: Laminectomy for facet/synovial cyst - left - Lumbar three-Lumbar four;  Surgeon: Julio Sicks, MD;  Location: [facility] OR;  Service: Neurosurgery;  Laterality: Left;   TONSILLECTOMY      Home Medications:  Allergies as of 09/10/2023       Reactions   Prednisone Other (See Comments)   Developed afib after use.        Medication List        Accurate as of September 10, 2023  4:31 PM. If you have any questions, ask your nurse or doctor.          STOP taking these medications     digoxin 0.125 MG tablet Commonly known as: LANOXIN Stopped by: Carman Ching   furosemide 20 MG tablet Commonly known as: LASIX Stopped by: Carman Ching       TAKE these medications    b complex-C-E-zinc tablet Take 1 tablet by mouth daily.   bisacodyl 5 MG EC tablet Commonly known as: DULCOLAX Take 2 tablets (10 mg total) by mouth at bedtime.   cholecalciferol 1000 units tablet Commonly known as: VITAMIN D Take 2,000 Units by mouth daily.   doxycycline 100 MG capsule Commonly known as: VIBRAMYCIN Take 100 mg by mouth at bedtime.   Eliquis 5 MG Tabs tablet Generic drug: apixaban Take 5 mg by mouth 2 (two) times daily.   ezetimibe 10 MG tablet Commonly known as: ZETIA Take 10 mg by mouth at bedtime.   folic acid 1 MG tablet Commonly known as: FOLVITE Take 5 mg by mouth daily.   MAGNESIUM GLUCONATE PO Take 350 mg by mouth daily. Take 115mg  tablet daily.   methotrexate 2.5 MG tablet Commonly known as: RHEUMATREX Take 10 mg by mouth once a week.   metoprolol tartrate 25 MG tablet Commonly known as: LOPRESSOR Take 1 tablet (25  mg total) by mouth 2 (two) times daily.   polyethylene glycol 17 g packet Commonly known as: MIRALAX / GLYCOLAX Take 17 g by mouth daily.   Potassium 99 MG Tabs Take 99 mg by mouth daily.   selenium sulfide 2.5 % lotion Commonly known as: SELSUN Apply 1 application topically daily.   silodosin 8 MG Caps capsule Commonly known as: RAPAFLO Take 1 capsule (8 mg total) by mouth daily with breakfast.   tamsulosin 0.4 MG Caps capsule Commonly known as: FLOMAX Take 0.4 mg by mouth.   vitamin E 1000 UNIT capsule Take 1,000 Units by mouth daily. Lunch   Welchol 625 MG tablet Generic drug: colesevelam Take 1,875 mg by mouth 2 (two) times daily.        Allergies:  Allergies  Allergen Reactions   Prednisone Other (See Comments)    Developed afib after use.    Family History: No family history on  file.  Social History:   reports that he has never smoked. He has never been exposed to tobacco smoke. He has never used smokeless tobacco. He reports that he does not drink alcohol and does not use drugs.  Physical Exam: There were no vitals taken for this visit.  Constitutional:  Alert and oriented, no acute distress, nontoxic appearing HEENT: [place], AT Cardiovascular: No clubbing, cyanosis, or edema Respiratory: Normal respiratory effort, no increased work of breathing Skin: No rashes, bruises or suspicious lesions Neurologic: Grossly intact, no focal deficits, moving all 4 extremities Psychiatric: Normal mood and affect  Laboratory Data: Results for orders placed or performed in visit on 09/10/23  BLADDER SCAN AMB NON-IMAGING   Collection Time: 09/10/23  3:26 PM  Result Value Ref Range   Scan Result    Assessment & Plan:   1. Urinary retention (Primary) He has voided multiple times and is comfortable.  We discussed various options including Foley catheter replacement with plans for cystoscopy TRUS versus continued trial without catheter with plans for repeat PVR tomorrow.  They elected for the latter, which is reasonable.  We discussed return precautions including the inability to void and abdominal distention/pain.  Will have him come back tomorrow morning for repeat bladder scan and proceed with plans for cystoscopy TRUS with Dr. Lonna Cobb regardless.  They are in agreement with this plan. - BLADDER SCAN AMB NON-IMAGING   Return in 1 day (on 09/11/2023) for Repeat PVR + 1 month cysto TRUS with Dr. Lonna Cobb.  Carman Ching, PA-C  [facility] [address] [place], [place] [address] [phone]

## 2023-09-11 ENCOUNTER — Encounter: Payer: Self-pay | Admitting: Urology

## 2023-09-11 ENCOUNTER — Ambulatory Visit: Payer: PPO | Admitting: Urology

## 2023-09-11 VITALS — BP 106/63 | HR 63 | Ht 72.0 in | Wt 124.0 lb

## 2023-09-11 DIAGNOSIS — R338 Other retention of urine: Secondary | ICD-10-CM

## 2023-09-11 DIAGNOSIS — N401 Enlarged prostate with lower urinary tract symptoms: Secondary | ICD-10-CM | POA: Diagnosis not present

## 2023-09-11 DIAGNOSIS — N138 Other obstructive and reflux uropathy: Secondary | ICD-10-CM

## 2023-09-11 LAB — BLADDER SCAN AMB NON-IMAGING: PVR: 388 WU

## 2023-10-07 ENCOUNTER — Ambulatory Visit: Payer: PPO

## 2023-10-07 DIAGNOSIS — R531 Weakness: Secondary | ICD-10-CM | POA: Diagnosis not present

## 2023-10-07 DIAGNOSIS — I4891 Unspecified atrial fibrillation: Secondary | ICD-10-CM | POA: Diagnosis not present

## 2023-10-07 DIAGNOSIS — I059 Rheumatic mitral valve disease, unspecified: Secondary | ICD-10-CM | POA: Diagnosis not present

## 2023-10-07 DIAGNOSIS — I5020 Unspecified systolic (congestive) heart failure: Secondary | ICD-10-CM | POA: Diagnosis not present

## 2023-10-07 DIAGNOSIS — Z951 Presence of aortocoronary bypass graft: Secondary | ICD-10-CM | POA: Diagnosis not present

## 2023-10-07 DIAGNOSIS — I10 Essential (primary) hypertension: Secondary | ICD-10-CM | POA: Diagnosis not present

## 2023-10-07 DIAGNOSIS — I482 Chronic atrial fibrillation, unspecified: Secondary | ICD-10-CM | POA: Diagnosis not present

## 2023-10-07 DIAGNOSIS — Z9889 Other specified postprocedural states: Secondary | ICD-10-CM | POA: Diagnosis not present

## 2023-10-07 DIAGNOSIS — Z87898 Personal history of other specified conditions: Secondary | ICD-10-CM | POA: Diagnosis not present

## 2023-10-07 DIAGNOSIS — G4733 Obstructive sleep apnea (adult) (pediatric): Secondary | ICD-10-CM | POA: Diagnosis not present

## 2023-10-07 DIAGNOSIS — I4819 Other persistent atrial fibrillation: Secondary | ICD-10-CM | POA: Diagnosis not present

## 2023-10-07 DIAGNOSIS — I255 Ischemic cardiomyopathy: Secondary | ICD-10-CM | POA: Diagnosis not present

## 2023-10-08 DIAGNOSIS — R399 Unspecified symptoms and signs involving the genitourinary system: Secondary | ICD-10-CM | POA: Diagnosis not present

## 2023-10-09 ENCOUNTER — Other Ambulatory Visit: Payer: Self-pay

## 2023-10-09 ENCOUNTER — Inpatient Hospital Stay: Payer: PPO

## 2023-10-09 ENCOUNTER — Encounter: Payer: Self-pay | Admitting: Intensive Care

## 2023-10-09 ENCOUNTER — Ambulatory Visit: Payer: PPO

## 2023-10-09 ENCOUNTER — Emergency Department: Payer: PPO

## 2023-10-09 ENCOUNTER — Inpatient Hospital Stay
Admission: EM | Admit: 2023-10-09 | Discharge: 2023-10-26 | DRG: 871 | Disposition: E | Payer: PPO | Attending: Pulmonary Disease | Admitting: Pulmonary Disease

## 2023-10-09 DIAGNOSIS — Z66 Do not resuscitate: Secondary | ICD-10-CM | POA: Diagnosis not present

## 2023-10-09 DIAGNOSIS — N3000 Acute cystitis without hematuria: Secondary | ICD-10-CM | POA: Diagnosis present

## 2023-10-09 DIAGNOSIS — I462 Cardiac arrest due to underlying cardiac condition: Secondary | ICD-10-CM | POA: Diagnosis not present

## 2023-10-09 DIAGNOSIS — R579 Shock, unspecified: Secondary | ICD-10-CM | POA: Insufficient documentation

## 2023-10-09 DIAGNOSIS — Z515 Encounter for palliative care: Secondary | ICD-10-CM

## 2023-10-09 DIAGNOSIS — I493 Ventricular premature depolarization: Secondary | ICD-10-CM | POA: Diagnosis present

## 2023-10-09 DIAGNOSIS — A419 Sepsis, unspecified organism: Secondary | ICD-10-CM | POA: Diagnosis not present

## 2023-10-09 DIAGNOSIS — J9600 Acute respiratory failure, unspecified whether with hypoxia or hypercapnia: Secondary | ICD-10-CM | POA: Diagnosis present

## 2023-10-09 DIAGNOSIS — Z7901 Long term (current) use of anticoagulants: Secondary | ICD-10-CM

## 2023-10-09 DIAGNOSIS — R918 Other nonspecific abnormal finding of lung field: Secondary | ICD-10-CM | POA: Diagnosis not present

## 2023-10-09 DIAGNOSIS — I959 Hypotension, unspecified: Secondary | ICD-10-CM | POA: Diagnosis not present

## 2023-10-09 DIAGNOSIS — K7200 Acute and subacute hepatic failure without coma: Secondary | ICD-10-CM | POA: Diagnosis not present

## 2023-10-09 DIAGNOSIS — E872 Acidosis, unspecified: Secondary | ICD-10-CM | POA: Diagnosis not present

## 2023-10-09 DIAGNOSIS — I341 Nonrheumatic mitral (valve) prolapse: Secondary | ICD-10-CM | POA: Diagnosis not present

## 2023-10-09 DIAGNOSIS — N179 Acute kidney failure, unspecified: Secondary | ICD-10-CM | POA: Diagnosis present

## 2023-10-09 DIAGNOSIS — R350 Frequency of micturition: Secondary | ICD-10-CM | POA: Diagnosis present

## 2023-10-09 DIAGNOSIS — I5023 Acute on chronic systolic (congestive) heart failure: Secondary | ICD-10-CM | POA: Diagnosis not present

## 2023-10-09 DIAGNOSIS — E785 Hyperlipidemia, unspecified: Secondary | ICD-10-CM | POA: Diagnosis present

## 2023-10-09 DIAGNOSIS — I110 Hypertensive heart disease with heart failure: Secondary | ICD-10-CM | POA: Diagnosis not present

## 2023-10-09 DIAGNOSIS — Z4682 Encounter for fitting and adjustment of non-vascular catheter: Secondary | ICD-10-CM | POA: Diagnosis not present

## 2023-10-09 DIAGNOSIS — Z85828 Personal history of other malignant neoplasm of skin: Secondary | ICD-10-CM

## 2023-10-09 DIAGNOSIS — I469 Cardiac arrest, cause unspecified: Secondary | ICD-10-CM | POA: Diagnosis not present

## 2023-10-09 DIAGNOSIS — Z8673 Personal history of transient ischemic attack (TIA), and cerebral infarction without residual deficits: Secondary | ICD-10-CM

## 2023-10-09 DIAGNOSIS — I252 Old myocardial infarction: Secondary | ICD-10-CM

## 2023-10-09 DIAGNOSIS — K761 Chronic passive congestion of liver: Secondary | ICD-10-CM | POA: Diagnosis present

## 2023-10-09 DIAGNOSIS — Z79899 Other long term (current) drug therapy: Secondary | ICD-10-CM

## 2023-10-09 DIAGNOSIS — Z87440 Personal history of urinary (tract) infections: Secondary | ICD-10-CM

## 2023-10-09 DIAGNOSIS — K6389 Other specified diseases of intestine: Secondary | ICD-10-CM | POA: Diagnosis not present

## 2023-10-09 DIAGNOSIS — N400 Enlarged prostate without lower urinary tract symptoms: Secondary | ICD-10-CM | POA: Diagnosis present

## 2023-10-09 DIAGNOSIS — I5022 Chronic systolic (congestive) heart failure: Secondary | ICD-10-CM | POA: Diagnosis not present

## 2023-10-09 DIAGNOSIS — R6521 Severe sepsis with septic shock: Secondary | ICD-10-CM | POA: Diagnosis not present

## 2023-10-09 DIAGNOSIS — J168 Pneumonia due to other specified infectious organisms: Secondary | ICD-10-CM | POA: Diagnosis not present

## 2023-10-09 DIAGNOSIS — R570 Cardiogenic shock: Secondary | ICD-10-CM | POA: Diagnosis present

## 2023-10-09 DIAGNOSIS — I700 Atherosclerosis of aorta: Secondary | ICD-10-CM | POA: Diagnosis not present

## 2023-10-09 DIAGNOSIS — Z952 Presence of prosthetic heart valve: Secondary | ICD-10-CM

## 2023-10-09 DIAGNOSIS — I255 Ischemic cardiomyopathy: Secondary | ICD-10-CM | POA: Diagnosis present

## 2023-10-09 DIAGNOSIS — E871 Hypo-osmolality and hyponatremia: Secondary | ICD-10-CM | POA: Diagnosis not present

## 2023-10-09 DIAGNOSIS — I480 Paroxysmal atrial fibrillation: Secondary | ICD-10-CM | POA: Diagnosis not present

## 2023-10-09 DIAGNOSIS — Z888 Allergy status to other drugs, medicaments and biological substances status: Secondary | ICD-10-CM

## 2023-10-09 DIAGNOSIS — K683 Retroperitoneal hematoma: Secondary | ICD-10-CM | POA: Diagnosis not present

## 2023-10-09 DIAGNOSIS — Z452 Encounter for adjustment and management of vascular access device: Secondary | ICD-10-CM | POA: Diagnosis not present

## 2023-10-09 DIAGNOSIS — R531 Weakness: Principal | ICD-10-CM

## 2023-10-09 DIAGNOSIS — J9601 Acute respiratory failure with hypoxia: Secondary | ICD-10-CM | POA: Diagnosis present

## 2023-10-09 DIAGNOSIS — N281 Cyst of kidney, acquired: Secondary | ICD-10-CM | POA: Diagnosis not present

## 2023-10-09 DIAGNOSIS — E875 Hyperkalemia: Secondary | ICD-10-CM | POA: Diagnosis present

## 2023-10-09 DIAGNOSIS — J189 Pneumonia, unspecified organism: Secondary | ICD-10-CM | POA: Diagnosis present

## 2023-10-09 DIAGNOSIS — R000 Tachycardia, unspecified: Secondary | ICD-10-CM | POA: Diagnosis not present

## 2023-10-09 DIAGNOSIS — R140 Abdominal distension (gaseous): Secondary | ICD-10-CM | POA: Diagnosis not present

## 2023-10-09 DIAGNOSIS — I25810 Atherosclerosis of coronary artery bypass graft(s) without angina pectoris: Secondary | ICD-10-CM | POA: Diagnosis present

## 2023-10-09 DIAGNOSIS — R8290 Unspecified abnormal findings in urine: Secondary | ICD-10-CM | POA: Diagnosis not present

## 2023-10-09 DIAGNOSIS — L120 Bullous pemphigoid: Secondary | ICD-10-CM | POA: Diagnosis present

## 2023-10-09 DIAGNOSIS — R4182 Altered mental status, unspecified: Secondary | ICD-10-CM | POA: Diagnosis not present

## 2023-10-09 DIAGNOSIS — G4733 Obstructive sleep apnea (adult) (pediatric): Secondary | ICD-10-CM | POA: Diagnosis present

## 2023-10-09 DIAGNOSIS — I2489 Other forms of acute ischemic heart disease: Secondary | ICD-10-CM | POA: Diagnosis not present

## 2023-10-09 DIAGNOSIS — I2510 Atherosclerotic heart disease of native coronary artery without angina pectoris: Secondary | ICD-10-CM | POA: Diagnosis present

## 2023-10-09 DIAGNOSIS — I6782 Cerebral ischemia: Secondary | ICD-10-CM | POA: Diagnosis not present

## 2023-10-09 DIAGNOSIS — I340 Nonrheumatic mitral (valve) insufficiency: Secondary | ICD-10-CM | POA: Diagnosis present

## 2023-10-09 DIAGNOSIS — I482 Chronic atrial fibrillation, unspecified: Secondary | ICD-10-CM | POA: Diagnosis present

## 2023-10-09 DIAGNOSIS — Z1152 Encounter for screening for COVID-19: Secondary | ICD-10-CM | POA: Diagnosis not present

## 2023-10-09 DIAGNOSIS — R188 Other ascites: Secondary | ICD-10-CM | POA: Diagnosis not present

## 2023-10-09 DIAGNOSIS — I517 Cardiomegaly: Secondary | ICD-10-CM | POA: Diagnosis not present

## 2023-10-09 DIAGNOSIS — I454 Nonspecific intraventricular block: Secondary | ICD-10-CM | POA: Diagnosis present

## 2023-10-09 DIAGNOSIS — J90 Pleural effusion, not elsewhere classified: Secondary | ICD-10-CM | POA: Diagnosis not present

## 2023-10-09 DIAGNOSIS — K449 Diaphragmatic hernia without obstruction or gangrene: Secondary | ICD-10-CM | POA: Diagnosis present

## 2023-10-09 DIAGNOSIS — G3184 Mild cognitive impairment, so stated: Secondary | ICD-10-CM | POA: Diagnosis present

## 2023-10-09 DIAGNOSIS — I4891 Unspecified atrial fibrillation: Secondary | ICD-10-CM | POA: Diagnosis not present

## 2023-10-09 HISTORY — DX: Heart failure, unspecified: I50.9

## 2023-10-09 LAB — LACTIC ACID, PLASMA
Lactic Acid, Venous: 3 mmol/L (ref 0.5–1.9)
Lactic Acid, Venous: 3.2 mmol/L (ref 0.5–1.9)
Lactic Acid, Venous: 7.1 mmol/L (ref 0.5–1.9)
Lactic Acid, Venous: 7.4 mmol/L (ref 0.5–1.9)

## 2023-10-09 LAB — BLOOD GAS, ARTERIAL
Acid-base deficit: 18.4 mmol/L — ABNORMAL HIGH (ref 0.0–2.0)
Bicarbonate: 11 mmol/L — ABNORMAL LOW (ref 20.0–28.0)
FIO2: 100 %
MECHVT: 500 mL
Mechanical Rate: 16
O2 Saturation: 100 %
PEEP: 5 cmH2O
Patient temperature: 37
pCO2 arterial: 38 mmHg (ref 32–48)
pH, Arterial: 7.07 — CL (ref 7.35–7.45)
pO2, Arterial: 326 mmHg — ABNORMAL HIGH (ref 83–108)

## 2023-10-09 LAB — CBC WITH DIFFERENTIAL/PLATELET
Abs Immature Granulocytes: 0.03 10*3/uL (ref 0.00–0.07)
Abs Immature Granulocytes: 0.12 10*3/uL — ABNORMAL HIGH (ref 0.00–0.07)
Basophils Absolute: 0 10*3/uL (ref 0.0–0.1)
Basophils Absolute: 0 10*3/uL (ref 0.0–0.1)
Basophils Relative: 0 %
Basophils Relative: 0 %
Eosinophils Absolute: 0 10*3/uL (ref 0.0–0.5)
Eosinophils Absolute: 0 10*3/uL (ref 0.0–0.5)
Eosinophils Relative: 0 %
Eosinophils Relative: 0 %
HCT: 37.7 % — ABNORMAL LOW (ref 39.0–52.0)
HCT: 37.8 % — ABNORMAL LOW (ref 39.0–52.0)
Hemoglobin: 12.5 g/dL — ABNORMAL LOW (ref 13.0–17.0)
Hemoglobin: 11.7 g/dL — ABNORMAL LOW (ref 13.0–17.0)
Immature Granulocytes: 0 %
Immature Granulocytes: 1 %
Lymphocytes Relative: 4 %
Lymphocytes Relative: 9 %
Lymphs Abs: 0.3 10*3/uL — ABNORMAL LOW (ref 0.7–4.0)
Lymphs Abs: 0.8 10*3/uL (ref 0.7–4.0)
MCH: 33 pg (ref 26.0–34.0)
MCH: 33.2 pg (ref 26.0–34.0)
MCHC: 33.2 g/dL (ref 30.0–36.0)
MCHC: 31 g/dL (ref 30.0–36.0)
MCV: 99.5 fL (ref 80.0–100.0)
MCV: 107.4 fL — ABNORMAL HIGH (ref 80.0–100.0)
Monocytes Absolute: 0.7 10*3/uL (ref 0.1–1.0)
Monocytes Absolute: 0.9 10*3/uL (ref 0.1–1.0)
Monocytes Relative: 8 %
Monocytes Relative: 10 %
Neutro Abs: 7.6 10*3/uL (ref 1.7–7.7)
Neutro Abs: 7.6 10*3/uL (ref 1.7–7.7)
Neutrophils Relative %: 88 %
Neutrophils Relative %: 80 %
Platelets: 143 10*3/uL — ABNORMAL LOW (ref 150–400)
Platelets: 129 10*3/uL — ABNORMAL LOW (ref 150–400)
RBC: 3.79 MIL/uL — ABNORMAL LOW (ref 4.22–5.81)
RBC: 3.52 MIL/uL — ABNORMAL LOW (ref 4.22–5.81)
RDW: 14.1 % (ref 11.5–15.5)
RDW: 14.2 % (ref 11.5–15.5)
WBC: 8.6 10*3/uL (ref 4.0–10.5)
WBC: 9.5 10*3/uL (ref 4.0–10.5)
nRBC: 0.3 % — ABNORMAL HIGH (ref 0.0–0.2)
nRBC: 0.4 % — ABNORMAL HIGH (ref 0.0–0.2)

## 2023-10-09 LAB — RESP PANEL BY RT-PCR (RSV, FLU A&B, COVID)  RVPGX2
Influenza A by PCR: NEGATIVE
Influenza A by PCR: NEGATIVE
Influenza B by PCR: NEGATIVE
Influenza B by PCR: NEGATIVE
Resp Syncytial Virus by PCR: NEGATIVE
Resp Syncytial Virus by PCR: NEGATIVE
SARS Coronavirus 2 by RT PCR: NEGATIVE
SARS Coronavirus 2 by RT PCR: NEGATIVE

## 2023-10-09 LAB — URINALYSIS, W/ REFLEX TO CULTURE (INFECTION SUSPECTED)
Bilirubin Urine: NEGATIVE
Glucose, UA: NEGATIVE mg/dL
Ketones, ur: NEGATIVE mg/dL
Nitrite: NEGATIVE
Protein, ur: 100 mg/dL — AB
RBC / HPF: >50 RBC/hpf (ref 0–5)
Specific Gravity, Urine: 1.01 (ref 1.005–1.030)
WBC, UA: >50 WBC/hpf (ref 0–5)
pH: 5 (ref 5.0–8.0)

## 2023-10-09 LAB — COMPREHENSIVE METABOLIC PANEL WITH GFR
ALT: 220 U/L — ABNORMAL HIGH (ref 0–44)
AST: 254 U/L — ABNORMAL HIGH (ref 15–41)
Albumin: 3 g/dL — ABNORMAL LOW (ref 3.5–5.0)
Alkaline Phosphatase: 50 U/L (ref 38–126)
Anion gap: 14 (ref 5–15)
BUN: 40 mg/dL — ABNORMAL HIGH (ref 8–23)
CO2: 13 mmol/L — ABNORMAL LOW (ref 22–32)
Calcium: 9.3 mg/dL (ref 8.9–10.3)
Chloride: 100 mmol/L (ref 98–111)
Creatinine, Ser: 1.64 mg/dL — ABNORMAL HIGH (ref 0.61–1.24)
GFR, Estimated: 41 mL/min — ABNORMAL LOW
Glucose, Bld: 209 mg/dL — ABNORMAL HIGH (ref 70–99)
Potassium: 4.8 mmol/L (ref 3.5–5.1)
Sodium: 127 mmol/L — ABNORMAL LOW (ref 135–145)
Total Bilirubin: 0.9 mg/dL (ref 0.0–1.2)
Total Protein: 5.3 g/dL — ABNORMAL LOW (ref 6.5–8.1)

## 2023-10-09 LAB — COMPREHENSIVE METABOLIC PANEL
ALT: 165 U/L — ABNORMAL HIGH (ref 0–44)
AST: 207 U/L — ABNORMAL HIGH (ref 15–41)
Albumin: 3.6 g/dL (ref 3.5–5.0)
Alkaline Phosphatase: 55 U/L (ref 38–126)
Anion gap: 11 (ref 5–15)
BUN: 41 mg/dL — ABNORMAL HIGH (ref 8–23)
CO2: 17 mmol/L — ABNORMAL LOW (ref 22–32)
Calcium: 8.9 mg/dL (ref 8.9–10.3)
Chloride: 96 mmol/L — ABNORMAL LOW (ref 98–111)
Creatinine, Ser: 1.58 mg/dL — ABNORMAL HIGH (ref 0.61–1.24)
GFR, Estimated: 43 mL/min — ABNORMAL LOW (ref >60)
Glucose, Bld: 144 mg/dL — ABNORMAL HIGH (ref 70–99)
Potassium: 6.1 mmol/L — ABNORMAL HIGH (ref 3.5–5.1)
Sodium: 124 mmol/L — ABNORMAL LOW (ref 135–145)
Total Bilirubin: 1.4 mg/dL — ABNORMAL HIGH (ref 0.0–1.2)
Total Protein: 6 g/dL — ABNORMAL LOW (ref 6.5–8.1)

## 2023-10-09 LAB — PROTIME-INR
INR: 2.1 — ABNORMAL HIGH (ref 0.8–1.2)
INR: 2.3 — ABNORMAL HIGH (ref 0.8–1.2)
Prothrombin Time: 24 s — ABNORMAL HIGH (ref 11.4–15.2)
Prothrombin Time: 25.7 s — ABNORMAL HIGH (ref 11.4–15.2)

## 2023-10-09 LAB — COOXEMETRY PANEL
Carboxyhemoglobin: 0.3 % — ABNORMAL LOW (ref 0.5–1.5)
Methemoglobin: <0.7 % (ref 0.0–1.5)
O2 Saturation: 30.1 %
Total hemoglobin: 11.7 g/dL — ABNORMAL LOW (ref 12.0–16.0)
Total oxygen content: 30 %

## 2023-10-09 LAB — APTT: aPTT: 41 s — ABNORMAL HIGH (ref 24–36)

## 2023-10-09 LAB — MRSA NEXT GEN BY PCR, NASAL: MRSA by PCR Next Gen: NOT DETECTED

## 2023-10-09 LAB — BRAIN NATRIURETIC PEPTIDE: B Natriuretic Peptide: 2892.2 pg/mL — ABNORMAL HIGH (ref 0.0–100.0)

## 2023-10-09 LAB — GLUCOSE, CAPILLARY: Glucose-Capillary: 93 mg/dL (ref 70–99)

## 2023-10-09 LAB — PHOSPHORUS: Phosphorus: 6.6 mg/dL — ABNORMAL HIGH (ref 2.5–4.6)

## 2023-10-09 LAB — PROCALCITONIN: Procalcitonin: 0.11 ng/mL

## 2023-10-09 LAB — CBG MONITORING, ED: Glucose-Capillary: 159 mg/dL — ABNORMAL HIGH (ref 70–99)

## 2023-10-09 LAB — MAGNESIUM: Magnesium: 2.5 mg/dL — ABNORMAL HIGH (ref 1.7–2.4)

## 2023-10-09 MED ORDER — ROCURONIUM BROMIDE 10 MG/ML (PF) SYRINGE
PREFILLED_SYRINGE | INTRAVENOUS | Status: AC
  Filled 2023-10-09: qty 10

## 2023-10-09 MED ORDER — EPINEPHRINE 1 MG/10ML IJ SOSY
PREFILLED_SYRINGE | INTRAMUSCULAR | Status: AC | PRN
  Administered 2023-10-09: 1 mg via INTRAVENOUS

## 2023-10-09 MED ORDER — SODIUM CHLORIDE 0.9 % IV SOLN
500.0000 mg | Freq: Once | INTRAVENOUS | Status: AC
  Administered 2023-10-09: 500 mg via INTRAVENOUS
  Filled 2023-10-09: qty 5

## 2023-10-09 MED ORDER — ALBUTEROL SULFATE (2.5 MG/3ML) 0.083% IN NEBU
10.0000 mg | INHALATION_SOLUTION | Freq: Once | RESPIRATORY_TRACT | Status: AC
Start: 2023-10-09 — End: 2023-10-09
  Administered 2023-10-09: 10 mg via RESPIRATORY_TRACT
  Filled 2023-10-09: qty 12

## 2023-10-09 MED ORDER — AMIODARONE IV BOLUS ONLY 150 MG/100ML
150.0000 mg | Freq: Once | INTRAVENOUS | Status: AC
  Administered 2023-10-09: 150 mg via INTRAVENOUS

## 2023-10-09 MED ORDER — STERILE WATER FOR INJECTION IV SOLN
INTRAVENOUS | Status: DC
  Filled 2023-10-09 (×3): qty 1000
  Filled 2023-10-09: qty 150

## 2023-10-09 MED ORDER — ATROPINE SULFATE 1 MG/10ML IJ SOSY
PREFILLED_SYRINGE | INTRAMUSCULAR | Status: AC | PRN
  Administered 2023-10-09: 1 mg via INTRAVENOUS

## 2023-10-09 MED ORDER — DEXTROSE 50 % IV SOLN
1.0000 | Freq: Once | INTRAVENOUS | Status: AC
  Administered 2023-10-09: 50 mL via INTRAVENOUS
  Filled 2023-10-09: qty 50

## 2023-10-09 MED ORDER — FENTANYL CITRATE PF 50 MCG/ML IJ SOSY: 25.0000 ug | PREFILLED_SYRINGE | INTRAMUSCULAR | Status: DC | PRN

## 2023-10-09 MED ORDER — PROPOFOL 1000 MG/100ML IV EMUL
0.0000 ug/kg/min | INTRAVENOUS | Status: DC
Start: 2023-10-09 — End: 2023-10-09

## 2023-10-09 MED ORDER — DOCUSATE SODIUM 50 MG/5ML PO LIQD: 100.0000 mg | Freq: Two times a day (BID) | ORAL | Status: DC | PRN

## 2023-10-09 MED ORDER — LIDOCAINE-EPINEPHRINE (PF) 2 %-1:200000 IJ SOLN
10.0000 mL | Freq: Once | INTRAMUSCULAR | Status: AC
  Administered 2023-10-09: 10 mL via INTRADERMAL
  Filled 2023-10-09: qty 10

## 2023-10-09 MED ORDER — CALCIUM CHLORIDE 10 % IV SOLN
INTRAVENOUS | Status: AC | PRN
  Administered 2023-10-09: 1 g via INTRAVENOUS

## 2023-10-09 MED ORDER — SODIUM CHLORIDE 0.9 % IV BOLUS: 1000.0000 mL | Freq: Once | INTRAVENOUS | Status: DC

## 2023-10-09 MED ORDER — MIDAZOLAM HCL 2 MG/2ML IJ SOLN: 1.0000 mg | INTRAMUSCULAR | Status: DC | PRN

## 2023-10-09 MED ORDER — AMIODARONE HCL IN DEXTROSE 360-4.14 MG/200ML-% IV SOLN
30.0000 mg/h | INTRAVENOUS | Status: DC
  Administered 2023-10-10: 30 mg/h via INTRAVENOUS

## 2023-10-09 MED ORDER — POLYETHYLENE GLYCOL 3350 17 G PO PACK
17.0000 g | PACK | Freq: Every day | ORAL | Status: DC
  Filled 2023-10-09: qty 1

## 2023-10-09 MED ORDER — CHLORHEXIDINE GLUCONATE CLOTH 2 % EX PADS
6.0000 | MEDICATED_PAD | Freq: Every day | CUTANEOUS | Status: DC
  Administered 2023-10-10: 6 via TOPICAL

## 2023-10-09 MED ORDER — FENTANYL CITRATE PF 50 MCG/ML IJ SOSY
25.0000 ug | PREFILLED_SYRINGE | INTRAMUSCULAR | Status: DC | PRN
Start: 2023-10-09 — End: 2023-10-09

## 2023-10-09 MED ORDER — FENTANYL 2500MCG IN NS 250ML (10MCG/ML) PREMIX INFUSION
INTRAVENOUS | Status: AC
  Administered 2023-10-09: 50 ug/h via INTRAVENOUS
  Filled 2023-10-09: qty 250

## 2023-10-09 MED ORDER — SODIUM CHLORIDE 0.9 % IV SOLN
2.0000 g | Freq: Once | INTRAVENOUS | Status: AC
  Administered 2023-10-09: 2 g via INTRAVENOUS
  Filled 2023-10-09: qty 20

## 2023-10-09 MED ORDER — FUROSEMIDE 10 MG/ML IJ SOLN
40.0000 mg | Freq: Once | INTRAMUSCULAR | Status: AC
  Administered 2023-10-09: 40 mg via INTRAVENOUS
  Filled 2023-10-09: qty 4

## 2023-10-09 MED ORDER — ETOMIDATE 2 MG/ML IV SOLN
INTRAVENOUS | Status: AC
  Filled 2023-10-09: qty 10

## 2023-10-09 MED ORDER — LACTATED RINGERS IV BOLUS
250.0000 mL | Freq: Once | INTRAVENOUS | Status: AC
  Administered 2023-10-09: 250 mL via INTRAVENOUS

## 2023-10-09 MED ORDER — IOHEXOL 350 MG/ML SOLN
75.0000 mL | Freq: Once | INTRAVENOUS | Status: AC | PRN
  Administered 2023-10-09: 75 mL via INTRAVENOUS

## 2023-10-09 MED ORDER — AMIODARONE IV BOLUS ONLY 150 MG/100ML
INTRAVENOUS | Status: AC
  Filled 2023-10-09: qty 100

## 2023-10-09 MED ORDER — SODIUM BICARBONATE 8.4 % IV SOLN
INTRAVENOUS | Status: AC | PRN
  Administered 2023-10-09: 25 meq via INTRAVENOUS

## 2023-10-09 MED ORDER — AMIODARONE HCL IN DEXTROSE 360-4.14 MG/200ML-% IV SOLN
60.0000 mg/h | INTRAVENOUS | Status: DC
  Filled 2023-10-09: qty 200

## 2023-10-09 MED ORDER — MILRINONE LACTATE IN DEXTROSE 20-5 MG/100ML-% IV SOLN
0.1250 ug/kg/min | INTRAVENOUS | Status: DC
  Administered 2023-10-09: 0.25 ug/kg/min via INTRAVENOUS
  Filled 2023-10-09: qty 100

## 2023-10-09 MED ORDER — DOCUSATE SODIUM 50 MG/5ML PO LIQD
100.0000 mg | Freq: Two times a day (BID) | ORAL | Status: DC
  Filled 2023-10-09: qty 10

## 2023-10-09 MED ORDER — FENTANYL 2500MCG IN NS 250ML (10MCG/ML) PREMIX INFUSION
0.0000 ug/h | INTRAVENOUS | Status: DC
Start: 2023-10-09 — End: 2023-10-10

## 2023-10-09 MED ORDER — ROCURONIUM BROMIDE 10 MG/ML (PF) SYRINGE
PREFILLED_SYRINGE | INTRAVENOUS | Status: AC | PRN
  Administered 2023-10-09: 60 mg via INTRAVENOUS

## 2023-10-09 MED ORDER — FENTANYL BOLUS VIA INFUSION: 50.0000 ug | INTRAVENOUS | Status: DC | PRN

## 2023-10-09 MED ORDER — NOREPINEPHRINE 16 MG/250ML-% IV SOLN
0.0000 ug/min | INTRAVENOUS | Status: DC
  Administered 2023-10-10: 12 ug/min via INTRAVENOUS
  Filled 2023-10-09: qty 250

## 2023-10-09 MED ORDER — POLYETHYLENE GLYCOL 3350 17 G PO PACK
17.0000 g | PACK | Freq: Every day | ORAL | Status: DC | PRN
Start: 2023-10-09 — End: 2023-10-10

## 2023-10-09 MED ORDER — AMIODARONE HCL IN DEXTROSE 360-4.14 MG/200ML-% IV SOLN
INTRAVENOUS | Status: AC
  Administered 2023-10-09: 60 mg/h via INTRAVENOUS
  Filled 2023-10-09: qty 200

## 2023-10-09 MED ORDER — SODIUM BICARBONATE 8.4 % IV SOLN
100.0000 meq | Freq: Once | INTRAVENOUS | Status: AC
  Administered 2023-10-09: 100 meq via INTRAVENOUS
  Filled 2023-10-09: qty 100

## 2023-10-09 MED ORDER — FAMOTIDINE 20 MG PO TABS: 20.0000 mg | ORAL_TABLET | Freq: Two times a day (BID) | ORAL | Status: DC

## 2023-10-09 MED ORDER — ETOMIDATE 2 MG/ML IV SOLN
INTRAVENOUS | Status: AC | PRN
  Administered 2023-10-09: 20 mg via INTRAVENOUS

## 2023-10-09 MED ORDER — INSULIN ASPART 100 UNIT/ML IV SOLN
5.0000 [IU] | Freq: Once | INTRAVENOUS | Status: AC
  Administered 2023-10-09: 5 [IU] via INTRAVENOUS
  Filled 2023-10-09: qty 0.05

## 2023-10-09 MED ORDER — EPINEPHRINE HCL 5 MG/250ML IV SOLN IN NS
0.5000 ug/min | INTRAVENOUS | Status: DC
  Administered 2023-10-09: 20 ug/min via INTRAVENOUS
  Administered 2023-10-10: 10 ug/min via INTRAVENOUS
  Filled 2023-10-09 (×2): qty 250

## 2023-10-09 MED ORDER — SODIUM CHLORIDE 0.9 % IV BOLUS
500.0000 mL | Freq: Once | INTRAVENOUS | Status: AC
  Administered 2023-10-09: 500 mL via INTRAVENOUS

## 2023-10-09 MED ORDER — SODIUM CHLORIDE 0.9 % IV BOLUS (SEPSIS)
1000.0000 mL | Freq: Once | INTRAVENOUS | Status: AC
  Administered 2023-10-09: 1000 mL via INTRAVENOUS

## 2023-10-09 MED ORDER — VASOPRESSIN 20 UNITS/100 ML INFUSION FOR SHOCK
0.0000 [IU]/min | INTRAVENOUS | Status: DC
  Administered 2023-10-09: 0.03 [IU]/min via INTRAVENOUS
  Administered 2023-10-10: 0.04 [IU]/min via INTRAVENOUS
  Filled 2023-10-09 (×2): qty 100

## 2023-10-09 MED ORDER — NOREPINEPHRINE 4 MG/250ML-% IV SOLN: 0.0000 ug/min | INTRAVENOUS | Status: DC

## 2023-10-09 MED ORDER — SODIUM ZIRCONIUM CYCLOSILICATE 10 G PO PACK
10.0000 g | PACK | Freq: Once | ORAL | Status: AC
  Administered 2023-10-09: 10 g via ORAL
  Filled 2023-10-09: qty 1

## 2023-10-09 MED ORDER — MAGNESIUM SULFATE 50 % IJ SOLN
INTRAMUSCULAR | Status: AC | PRN
  Administered 2023-10-09: 1 g via INTRAVENOUS

## 2023-10-09 MED ORDER — FAMOTIDINE 20 MG PO TABS
20.0000 mg | ORAL_TABLET | Freq: Every day | ORAL | Status: DC
  Filled 2023-10-09: qty 1

## 2023-10-09 MED ORDER — SODIUM CHLORIDE 0.9 % IV BOLUS (SEPSIS): 500.0000 mL | Freq: Once | INTRAVENOUS | Status: DC

## 2023-10-09 MED ORDER — NOREPINEPHRINE 4 MG/250ML-% IV SOLN
INTRAVENOUS | Status: AC
  Administered 2023-10-09: 2 ug/min via INTRAVENOUS
  Filled 2023-10-09: qty 250

## 2023-10-09 NOTE — Sepsis Progress Note (Signed)
 eLink is following this Code Sepsis.

## 2023-10-09 NOTE — Progress Notes (Signed)
 eLink Physician-Brief Progress Note Patient Name: Juan [name] DOB: 09-Sep-1941 MRN: [id number]   Date of Service  10/09/2023  HPI/Events of Note  Mixed cardiogenic and septic shock -Chronic systolic heart failure -Chronic A-fib now with RVR -Chronic indwelling Foley -Rising lactate -Brief arrest in ED requiring CPR  eICU Interventions  Discussed with APP Plan to obtain central line and Co. Ox Rising lactate could be related to brief arrest and decreased perfusion CT abdomen suggests retroperitoneal hematoma and cystitis   New patient evaluation     [name] V. Vania Rosero 10/09/2023, 10:52 PM

## 2023-10-09 NOTE — ED Triage Notes (Signed)
 First Nurse Note;  Pt via [facility] from home. Pt c/o generalized weakness this AM. Pt was dx yesterday with UTI has taken 2 doses of his abx. Pt c/o weakness this AM. Initial BP 70/54, EMS gave bolus 103/69 most recent, 20 RR, 97.7 oral, 120 HR. Pt is A&Ox4 and NAD.  20G L AC

## 2023-10-09 NOTE — ED Notes (Addendum)
 Pt found to be less responsive; primary RN alerted staff and provider. Pulse faint upon initial assessment.

## 2023-10-09 NOTE — ED Provider Triage Note (Signed)
Emergency Medicine Provider Triage Evaluation Note  Juan [name] , a 83 y.o. male  was evaluated in triage.  Pt complains of weakness. Brought in by wife who reports that he has had a fast heart rate for a few days, and then today he was too weak to get up and they had to call EMS. Patient reports he hasn't been feeling well for 1 week. Decreased UO through foley, diagnosed with UTI yesterday.  EMS reports BP was 70/40, given 500cc bolus. Wife reports more confused over last couple days.  Review of Systems  Positive: Weakness, fatigue Negative: fever  Physical Exam  BP 93/73 (BP Location: Right Arm)   Pulse (!) 116   Temp (!) 97.5 F (36.4 C) (Oral)   Resp (!) 22   Ht 6' (1.829 m)   Wt 56.2 kg   SpO2 95%   BMI 16.82 kg/m  Gen:   Awake, no distress   Resp:  Normal effort  MSK:   Moves extremities without difficulty  Other:    Medical Decision Making  Medically screening exam initiated at 2:08 PM.  Appropriate orders placed.  Aram Candela Direnzo was informed that the remainder of the evaluation will be completed by another provider, this initial triage assessment does not replace that evaluation, and the importance of remaining in the ED until their evaluation is complete.     Jackelyn Hoehn, PA-C 10/09/23 1410

## 2023-10-09 NOTE — H&P (Signed)
NAME:  Juan [name], MRN:  [id number], DOB:  08-18-1941, LOS: 0 ADMISSION DATE:  10/09/2023, CONSULTATION DATE:  10/09/23 REFERRING MD:  dr. Rosalia Hammers, CHIEF COMPLAINT:   Weakness  History of Present Illness:  83 yo M presenting to [facility] ED from home for evaluation of weakness and confusion.  History obtained per chart review and spouse bedside report as patient is intubated and sedated, unable to participate in an interview at this time. Per his spouse the patient was in his normal state of health at home which includes being independent of all ADL's, taking care of the yard and ambulatory without baseline fatigue or dyspnea until last weekend (1/11-1/12). She noticed he began "breathing heavy" and called his cardiologist who saw him on 1/13 outpatient. Cardiologist felt the dyspnea was due to his Atrial Fibrillation and prescribed 2 new medications losartan with K+ and amiodarone with plans for follow up in one week. 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 These were similar to symptoms he had previously with a UTI, and a sample was sent to PCP that was + for UTI. He was prescribed antibiotics and has completed 2 doses. Wife endorses generalized fatigue and weakness, confusion, decreased UOP, diarrhea, poor PO intake and the dyspnea. She denies fever/chills, abdominal pain/ nausea/ vomiting, other urinary symptoms, falls or LOC.  EMS was called to the house when the patient was too weak to get up on his own. They found the patient hypotensive with BP 70/54 which was responsive to 500 mL bolus.  ED course: Upon arrival patient alert and oriented with an improved BP, however still tachycardic/ tachypneic. Sepsis protocol initiated with IVF and empiric antibiotics. Labs significant for hyponatremia, AKI, hyperkalemia, lactic acidosis without leukocytosis, Transaminitis, NAGMA, hypochloremia and  elevated INR. Imaging initially concerning for pneumonia and small bilateral pleural effusions. While awaiting [facility] admission patient became suddenly dyspneic and had a brief cardiac arrest for about 3-4 minutes, ACLS medications listed below. Patient emergently intubated requiring mechanical ventilatory support due to AMS and PCCM consulted. Initial EKG showed a wide complex tachycardia, prolonged Qtc and non specific T wave abnormalities suggestive of ischemia. Follow up EKG post cardiac arrest revealed A-fib RVR with bundle branch block. Medications given: albuterol 10 mg, insulin 5 units & D50, lasix, Lokelma, azithromycin & rocephin, 1.5 L IVF bolus, etomidate & rocuronium, atropine, Ca+, Mg 1g, sodium bicarb, epi x 2, levophed & fentanyl drips started Initial Vitals: 97.5/ 22/ 116/ 93/73 & 95% on RA Significant labs: (Labs/ Imaging personally reviewed) I, Cheryll Cockayne Rust-Chester, AGACNP-BC, personally viewed and interpreted this ECG. EKG Interpretation: Date: 10/09/23, EKG Time: 14:11, Rate: 121, Rhythm: Wide QRS tachycardia, QRS Axis:  RAD, Intervals: prolonged Qtc, ST/T Wave abnormalities: non specific T wave abnormalities-, Narrative Interpretation: wide complex tachycardia, prolonged Qtc and non specific T wave abnormalities suggestive of ischemia Chemistry: Na+: 124 > 127, K+: 6.1> 4.8, BUN/Cr.: 41/1.58 > 40/1.64, Serum CO2/ AG: 17/11 > 13/14, AST/ALT: 207/ 165 > 254/220, T. Bili: 1.4 Hematology: WBC: 8.6, Hgb: 12.5> 11.7, plt: 143 INR: 2.3 Troponin: 84, BNP: 2892, Lactic/ PCT: 3.0 > 3.2 > 7.1/ 0.11,  COVID-19 & Influenza A/B: negative UA: +leuks, +hgb, +proteinuria with pyuria ABG: 7.07/ 38/ 326/ 11 Co-ox: 30.1% CXR 10/08/22:  Cardiomegaly with vascular congestion and small pleural effusions. Dense left lung base airspace disease which may be due to atelectasis or pneumonia. CT  head wo contrast 10/09/23: No CT evidence for acute intracranial abnormality. Atrophy and chronic small vessel  ischemic changes of the white matter. Chronic left cerebellar and occipital infarcts. Interval small right posterior frontal infarct, which also appears chronic but is new compared with November 2024 CT. CT angio chest PE 10/09/23: Very limited study for assessment of pulmonary embolus due to heterogeneous opacification of the main pulmonary arteries suspected to be secondary to mixing artifact with similar appearing hypodensities extending into the descending bilateral lobar arteries as well as right lower lobe segmental and subsegmental vessels; essentially non opacification of the lower lobe segmental and subsegmental vessels and non opacification of the distal right lower lobe pulmonary artery, thereby limiting assessment for pulmonary embolus. There is however some opacifications of lower lobe pulmonary vessels on the included portions of lower chest on the abdominopelvic CT subsequently obtained. Findings are suggestive of poor cardiac output/decreased cardiac function. Marked cardiomegaly with multi chamber enlargement. Reflux of contrast into the hepatic veins consistent with elevated right heart pressures. Moderate right and small to moderate left pleural effusions. Bilateral lower lobe partial consolidations, suspect for atelectasis. Aortic atherosclerosis. CT abdomen/pelvis w contrast 10/09/23: . Slightly dense small moderate heterogeneous collection within the right perinephric space and retroperitoneum, suspicious for retroperitoneal hematoma. No convincing acute extravasation on this exam. Moderate right and small left pleural effusions with partial lower lobe consolidations probably due to atelectasis. Cardiomegaly with generalized mesenteric edema, anasarca, and small volume ascites. Enteric tube tip at the GE junction, recommend advancement. Fluid-filled mid to distal small bowel without well demonstrated transition point, possible ileus. Diffusely thick-walled urinary bladder decompressed  by Foley catheter, correlate for cystitis.  PCCM consulted for admission due to cardiac arrest suspect multifocal secondary to acute on chronic heart failure, cardiogenic and suspected underlying sepsis s/t UTI.  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  Interim History / Subjective:  Patient sedated and intubated, unable to participate in interview at this time. Discussed clinical status with spouse bedside and daughter via telephone. All questions and concerns answered at this time.  Objective   Blood pressure 114/73, pulse (!) 25, temperature (!) 96.7 F (35.9 C), resp. rate 16, height 6' (1.829 m), weight 56.2 kg, SpO2 (!) 81%.        Intake/Output Summary (Last 24 hours) at 10/09/2023 2048 Last data filed at 10/09/2023 2046 Gross per 24 hour  Intake 1000 ml  Output --  Net 1000 ml   Filed Weights   10/09/23 1406  Weight: 56.2 kg    Examination: General: Adult male, critically ill, lying in bed intubated & sedated requiring mechanical ventilation, NAD HEENT: MM dusky/dry, anicteric, atraumatic, neck supple Neuro: RASS: -4, unable to follow commands, PERRL +3/sluggish CV: s1s2 irregular, afib rvr with BBB on monitor, no r/m/g Pulm: Regular, non labored on PRVC 100% & PEEP 5 , breath sounds coarse-BUL & diminished-BLL GI: soft, rounded, hiatal hernia, bs x 4 GU: foley in place with clear amber urine Skin: scattered skin tears/ ecchymosis noted, mostly bilateral upper extremities Extremities: warm/dry, pulses + 2 R/P, abdominal edema noted  Resolved  Hospital  Problem list     Assessment & Plan:  Brief Cardiac arrest: initial rhythm unknown  Circulatory shock suspect cardiogenic +/- sepsis Chronic / Paroxysmal Atrial Fibrillation with Rapid Ventricular Response Acute on Chronic HFrEF PMHx: CAD s/p MI & CABG, MVP s/p repair, HFrEF, HTN, HLD 3-5 minutes downtime, no defibrillations CHA2DS2-VASc score: 7 points - Amiodarone bolus followed by infusion - hold systemic anticoagulation d/t small-moderate retroperitoneal hematoma - Echocardiogram ordered - Cardiology consulted, appreciate input - Continuous cardiac monitoring - will place central line> STAT Co-ox, consider milrinone drip based on findings - assess CVP Q 4 h - Continue vasopressors: levophed, vasopressin added, consider epinephrine PRN, to maintain MAP > 65 - Trend troponin, lactic, VBG  - Hold preadmission medication: eliquis, lopressor, losartan  Acute Hypoxic Respiratory Failure secondary to moderate bilateral pleural effusions & suspected aspiration in the setting of cardiac arrest and acute on chronic HFrEF PMHx: OSA on CPAP - Ventilator settings: PRVC  8 mL/kg, 100% FiO2, 5 PEEP, continue ventilator support & lung protective strategies - Wean PEEP & FiO2 as tolerated, maintain SpO2 > 90% - Head of bed elevated 30 degrees, VAP protocol in place - Plateau pressures less than 30 cm H20  - Intermittent chest x-ray & ABG PRN - Daily WUA with SBT as tolerated  - Ensure adequate pulmonary hygiene  - bronchodilators PRN - PAD protocol in place: continue Fentanyl drip & Versed IVP  Suspected Sepsis due to suspected UTI in the setting of chronic foley use Lactic: 3.0 > 3.2 > 7.1 > 7.4, Baseline PCT: 0.11 Initial interventions/workup included: 2 L of NS/LR & Rocephin, Azithromycin - f/u cultures, trend lactic/ PCT - Daily CBC, monitor WBC/ fever curve - IV antibiotics:  zosyn to cover for possible aspiration & cystitis  - Vasopressors to maintain MAP< 65  Acute Kidney  Injury suspect secondary to cardiorenal syndrome +/- sepsis and circulatory shock in the setting of UTI and cardiac arrest NAGMA Hyperkalemia - resolved with shifting measures Hyponatremia  Baseline Cr: 0.95, Cr on admission:1.58 - Strict I/O's: alert provider if UOP < 0.5 mL/kg/hr - gentle IVF hydration  - Daily BMP, replace electrolytes PRN - Avoid nephrotoxic agents as able, ensure adequate renal perfusion - Consider nephrology consultation  - sodium bicarbonate drip ordered  - check urine lytes  Suspected Small- Moderate Retroperitoneal Hematoma on CT without acute extravasation noted Moderately Elevated INR Discussed case with Vascular surgery on call who agreed that there does not appear to be any active bleeding. Not a candidate for any intervention - serial CBC's have remained stable, will trend throughout shift - monitor for any signs of bleeding > IV sites persistently oozing - 1 unit of FFP ordered - daily INR, f/u fibrinogen & D.Dimer - hold systemic anticoagulation at this time  Transaminitis suspect secondary to acute on chronic HFrEF and low CO - Trend hepatic function - avoid hepatotoxic agents  Best Practice (right click and "Reselect all SmartList Selections" daily)  Diet/type: NPO w/ meds via tube DVT prophylaxis SCD Pressure ulcer(s): N/A GI prophylaxis: H2B Lines: Central line and yes and it is still needed Foley:  Yes, and it is still needed Code Status:  full code Last date of multidisciplinary goals of care discussion [10/09/23]  Labs   CBC: Recent Labs  Lab 10/09/23 1415  WBC 8.6  NEUTROABS 7.6  HGB 12.5*  HCT 37.7*  MCV 99.5  PLT 143*    Basic Metabolic Panel: Recent Labs  Lab 10/09/23 1415  NA 124*  K  6.1*  CL 96*  CO2 17*  GLUCOSE 144*  BUN 41*  CREATININE 1.58*  CALCIUM 8.9   GFR: Estimated Creatinine Clearance: 28.2 mL/min (A) (by C-G formula based on SCr of 1.58 mg/dL (H)). Recent Labs  Lab 10/09/23 1415 10/09/23 1604   WBC 8.6  --   LATICACIDVEN 3.0* 3.2*    Liver Function Tests: Recent Labs  Lab 10/09/23 1415  AST 207*  ALT 165*  ALKPHOS 55  BILITOT 1.4*  PROT 6.0*  ALBUMIN 3.6   No results for input(s): "LIPASE", "AMYLASE" in the last 168 hours. No results for input(s): "AMMONIA" in the last 168 hours.  ABG No results found for: "PHART", "PCO2ART", "PO2ART", "HCO3", "TCO2", "ACIDBASEDEF", "O2SAT"   Coagulation Profile: Recent Labs  Lab 10/09/23 1415  INR 2.1*    Cardiac Enzymes: No results for input(s): "CKTOTAL", "CKMB", "CKMBINDEX", "TROPONINI" in the last 168 hours.  HbA1C: No results found for: "HGBA1C"  CBG: No results for input(s): "GLUCAP" in the last 168 hours.  Review of Systems:   UTA- patient intubated and sedated, unable to participate in interview at this time.  Past Medical History:  He,  has a past medical history of Arthritis, Atrial fibrillation (HCC), Cancer (HCC), CHF (congestive heart failure) (HCC), Coronary artery disease, Dysrhythmia, MI (myocardial infarction) (HCC), Sleep apnea, and Stroke (HCC).   Surgical History:   Past Surgical History:  Procedure Laterality Date   APPENDECTOMY     BACK SURGERY  09/2011   lumbar fusion    CARDIAC CATHETERIZATION     CARDIAC SURGERY     CARDIAC VALVE REPLACEMENT     CORONARY ARTERY BYPASS GRAFT     EYE SURGERY Bilateral    catarascts removed-    HERNIA REPAIR Bilateral 1946, 1990's   inguinal x3 procedures    LUMBAR LAMINECTOMY/DECOMPRESSION MICRODISCECTOMY Left 08/14/2019   Procedure: Laminectomy for facet/synovial cyst - left - Lumbar three-Lumbar four;  Surgeon: Julio Sicks, MD;  Location: [facility] OR;  Service: Neurosurgery;  Laterality: Left;   TONSILLECTOMY       Social History:   reports that he has never smoked. He has never been exposed to tobacco smoke. He has never used smokeless tobacco. He reports that he does not drink alcohol and does not use drugs.   Family History:  His family history is  not on file.   Allergies Allergies  Allergen Reactions   Prednisone Other (See Comments)    Developed afib after use.     Home Medications  Prior to Admission medications   Medication Sig Start Date End Date Taking? Authorizing Provider  bisacodyl (DULCOLAX) 5 MG EC tablet Take 2 tablets (10 mg total) by mouth at bedtime. 08/06/23  Yes Gillis Santa, MD  apixaban (ELIQUIS) 5 MG TABS tablet Take 5 mg by mouth 2 (two) times daily. 11/13/17   [provider]  B Complex-C-E-Zn (B COMPLEX-C-E-ZINC) tablet Take 1 tablet by mouth daily.    [provider]  cholecalciferol (VITAMIN D) 1000 units tablet Take 2,000 Units by mouth daily.    [provider]  colesevelam (WELCHOL) 625 MG tablet Take 1,875 mg by mouth 2 (two) times daily.  11/26/17   [provider]  doxycycline (VIBRAMYCIN) 100 MG capsule Take 100 mg by mouth at bedtime.  11/11/17   [provider]  ezetimibe (ZETIA) 10 MG tablet Take 10 mg by mouth at bedtime.  05/28/17   [provider]  folic acid (FOLVITE) 1 MG tablet Take 5 mg by  mouth daily.    [provider]  MAGNESIUM GLUCONATE PO Take 350 mg by mouth daily. Take 115mg  tablet daily.    [provider]  methotrexate (RHEUMATREX) 2.5 MG tablet Take 10 mg by mouth once a week.    [provider]  metoprolol tartrate (LOPRESSOR) 25 MG tablet Take 1 tablet (25 mg total) by mouth 2 (two) times daily. 08/06/23 08/05/24  Gillis Santa, MD  polyethylene glycol (MIRALAX / GLYCOLAX) 17 g packet Take 17 g by mouth daily. 08/07/23   Gillis Santa, MD  Potassium 99 MG TABS Take 99 mg by mouth daily.    [provider]  selenium sulfide (SELSUN) 2.5 % shampoo Apply 1 application topically daily. 05/26/19   [provider]  silodosin (RAPAFLO) 8 MG CAPS capsule Take 1 capsule (8 mg total) by mouth daily with breakfast. 08/29/23   Stoioff, Verna Czech, MD  tamsulosin (FLOMAX) 0.4 MG CAPS capsule Take 0.4 mg  by mouth.    [provider]  vitamin E 1000 UNIT capsule Take 1,000 Units by mouth daily. Lunch    [provider]     Critical care time: 74 minutes       Betsey Holiday, AGACNP-BC Acute Care Nurse Practitioner [facility]   [phone] / [phone] Please see Amion for details.

## 2023-10-09 NOTE — ED Provider Notes (Addendum)
 [facility] Provider Note    Event Date/Time   First MD Initiated Contact with Patient 10/09/23 1546     (approximate)   History   Weakness   HPI  Juan [name] is a 83 year old male presenting to the emergency department for evaluation of weakness and confusion.  Accompanied by wife who primarily provides history.  She reports that on Monday, patient was seen by cardiology, diagnosed with A-fib.  Yesterday she noticed that the patient was having urinary frequency and confusion consistent with prior UTIs.  She provided a urinalysis at [facility] clinic and was told that patient did have evidence of a UTI and has taken the first 2 doses of antibiotics.  However, today he has been more confused and unable to get up on her own with poor p.o. intake.  No noted fevers.     Physical Exam   Triage Vital Signs: ED Triage Vitals  Encounter Vitals Group     BP 10/09/23 1404 93/73     Systolic BP Percentile --      Diastolic BP Percentile --      Pulse Rate 10/09/23 1404 (!) 116     Resp 10/09/23 1404 (!) 22     Temp 10/09/23 1404 (!) 97.5 F (36.4 C)     Temp Source 10/09/23 1404 Oral     SpO2 10/09/23 1404 95 %     Weight 10/09/23 1406 124 lb (56.2 kg)     Height 10/09/23 1406 6' (1.829 m)     Head Circumference --      Peak Flow --      Pain Score 10/09/23 1406 0     Pain Loc --      Pain Education --      Exclude from Growth Chart --     Most recent vital signs: Vitals:   10/09/23 2000 10/09/23 2003  BP: 114/73   Pulse: (!) 25   Resp: 16   Temp:  (!) 96.7 F (35.9 C)  SpO2: (!) 81%      General: Awake, interactive  CV:  Tachycardic, good peripheral perfusion Resp:  Unlabored respirations, lungs clear to auscultation Abd:  Nondistended, soft, nontender Neuro:  No gross facial asymmetry, symmetric generalized weakness, oriented to person and place   ED Results / Procedures / Treatments   Labs (all labs ordered are listed, but only  abnormal results are displayed) Labs Reviewed  LACTIC ACID, PLASMA - Abnormal; Notable for the following components:      Result Value   Lactic Acid, Venous 3.0 (*)    All other components within normal limits  LACTIC ACID, PLASMA - Abnormal; Notable for the following components:   Lactic Acid, Venous 3.2 (*)    All other components within normal limits  COMPREHENSIVE METABOLIC PANEL - Abnormal; Notable for the following components:   Sodium 124 (*)    Potassium 6.1 (*)    Chloride 96 (*)    CO2 17 (*)    Glucose, Bld 144 (*)    BUN 41 (*)    Creatinine, Ser 1.58 (*)    Total Protein 6.0 (*)    AST 207 (*)    ALT 165 (*)    Total Bilirubin 1.4 (*)    GFR, Estimated 43 (*)    All other components within normal limits  CBC WITH DIFFERENTIAL/PLATELET - Abnormal; Notable for the following components:   RBC 3.79 (*)    Hemoglobin 12.5 (*)    HCT  37.7 (*)    Platelets 143 (*)    nRBC 0.3 (*)    Lymphs Abs 0.3 (*)    All other components within normal limits  PROTIME-INR - Abnormal; Notable for the following components:   Prothrombin Time 24.0 (*)    INR 2.1 (*)    All other components within normal limits  APTT - Abnormal; Notable for the following components:   aPTT 41 (*)    All other components within normal limits  CBG MONITORING, ED - Abnormal; Notable for the following components:   Glucose-Capillary 159 (*)    All other components within normal limits  RESP PANEL BY RT-PCR (RSV, FLU A&B, COVID)  RVPGX2  RESP PANEL BY RT-PCR (RSV, FLU A&B, COVID)  RVPGX2  CULTURE, BLOOD (ROUTINE X 2)  CULTURE, BLOOD (ROUTINE X 2)  URINALYSIS, W/ REFLEX TO CULTURE (INFECTION SUSPECTED)  LACTIC ACID, PLASMA  COMPREHENSIVE METABOLIC PANEL  MAGNESIUM   PHOSPHORUS  LACTIC ACID, PLASMA  LACTIC ACID, PLASMA  PROCALCITONIN  BRAIN NATRIURETIC PEPTIDE  CBC WITH DIFFERENTIAL/PLATELET  PROTIME-INR  CBC  BASIC METABOLIC PANEL  MAGNESIUM   PHOSPHORUS  BLOOD GAS, ARTERIAL  TRIGLYCERIDES   BLOOD GAS, ARTERIAL     EKG EKG independently reviewed interpreted by myself (ER attending) demonstrates:  EKG demonstrates wide-complex tachycardia at a rate of 121, suspect sinus with underlying left bundle (not new), but somewhat difficult to interpret with underlying artifact  RADIOLOGY Imaging independently reviewed and interpreted by myself demonstrates:  CXR with new patchy opacities, concerning for possible infection  PROCEDURES:  Critical Care performed: Yes, see critical care procedure note(s)  CRITICAL CARE Performed by: Claria Crofts   Total critical care time: 75 minutes  Critical care time was exclusive of separately billable procedures and treating other patients.  Critical care was necessary to treat or prevent imminent or life-threatening deterioration.  Critical care was time spent personally by me on the following activities: development of treatment plan with patient and/or surrogate as well as nursing, discussions with consultants, evaluation of patient's response to treatment, examination of patient, obtaining history from patient or surrogate, ordering and performing treatments and interventions, ordering and review of laboratory studies, ordering and review of radiographic studies, pulse oximetry and re-evaluation of patient's condition.   Procedure Name: Intubation Date/Time: 10/09/2023 9:31 PM  Performed by: Claria Crofts, MDInduction Type: Rapid sequence Laryngoscope Size: Glidescope and 4 Grade View: Grade II Tube size: 7.5 mm Number of attempts: 1 Airway Equipment and Method: Bougie stylet Dental Injury: Teeth and Oropharynx as per pre-operative assessment  Comments: Intubated with etomidate  and rocuronium        MEDICATIONS ORDERED IN ED: Medications  norepinephrine  (LEVOPHED ) 4mg  in (0.016 mg/mL) premix infusion (2 mcg/min Intravenous New Bag/Given 10/09/23 2104)  docusate (COLACE) 50 MG/5ML liquid 100 mg (has no administration in time  range)  polyethylene glycol (MIRALAX  / GLYCOLAX ) packet 17 g (has no administration in time range)  docusate (COLACE) 50 MG/5ML liquid 100 mg (has no administration in time range)  polyethylene glycol (MIRALAX  / GLYCOLAX ) packet 17 g (has no administration in time range)  propofol  (DIPRIVAN ) 1000 MG/100ML infusion (has no administration in time range)  midazolam  (VERSED ) injection 1-2 mg (has no administration in time range)  famotidine  (PEPCID ) tablet 20 mg (has no administration in time range)  rocuronium  (ZEMURON ) 100 MG/10ML injection (has no administration in time range)  etomidate  (AMIDATE ) 2 MG/ML injection (has no administration in time range)  rocuronium  (ZEMURON ) injection (60 mg Intravenous Given 10/09/23 2052)  etomidate  (AMIDATE ) injection (20 mg Intravenous Given 10/09/23 2052)  magnesium  sulfate (IV Push/IM) injection (1 g Intravenous Given 10/09/23 2057)  fentaNYL  in NS (65mcg/ml) infusion-PREMIX (has no administration in time range)  sodium bicarbonate  injection (25 mEq Intravenous Given 10/09/23 2046)  fentaNYL  (SUBLIMAZE ) bolus via infusion 50 mcg (has no administration in time range)  calcium  chloride injection (1 g Intravenous Given 10/09/23 2047)  cefTRIAXone  (ROCEPHIN ) 2 g in sodium chloride  0.9 % 100 mL IVPB (0 g Intravenous Stopped 10/09/23 1733)  sodium chloride  0.9 % bolus 1,000 mL (0 mLs Intravenous Stopped 10/09/23 1822)  sodium zirconium cyclosilicate  (LOKELMA ) packet 10 g (10 g Oral Given 10/09/23 1659)  albuterol  (PROVENTIL ) (2.5 MG/3ML) 0.083% nebulizer solution 10 mg (10 mg Nebulization Given 10/09/23 1648)  insulin  aspart (novoLOG ) injection 5 Units (5 Units Intravenous Given 10/09/23 1700)    And  dextrose  50 % solution 50 mL (50 mLs Intravenous Given 10/09/23 1648)  furosemide  (LASIX ) injection 40 mg (40 mg Intravenous Given 10/09/23 1647)  sodium chloride  0.9 % bolus 500 mL (0 mLs Intravenous Stopped 10/09/23 2046)  azithromycin  (ZITHROMAX ) 500 mg in  sodium chloride  0.9 % 250 mL IVPB (500 mg Intravenous New Bag/Given 10/09/23 2001)  EPINEPHrine  (ADRENALIN ) 1 MG/10ML injection (1 mg Intravenous Given 10/09/23 2037)  atropine  1 MG/10ML injection (1 mg Intravenous Given 10/09/23 2038)     IMPRESSION / MDM / ASSESSMENT AND PLAN / ED COURSE  I reviewed the triage vital signs and the nursing notes.  Differential diagnosis includes, but is not limited to, UTI, pneumonia, other infection, anemia, electrolyte abnormality, dehydration  Patient's presentation is most consistent with acute presentation with potential threat to life or bodily function.  83 year old male presenting to the emergency department for generalized weakness, recently diagnosed UTI. Hypotensive with EMS, BP improved here, but tachycardic and tachypneic, does meet sepsis criteria.  Ordered for empiric Rocephin  and IV fluids.  Received 500 cc with EMS, ordered for an additional liter.   Labs with stable anemia.  CMP demonstrates hyponatremia with sodium of 124, hyperkalemia with K of 6.1, but only mild increased renal impairment at 1.58.  Lactate elevated at 3, slight increase to 3.2 on repeat, but before most of fluid resuscitation.  Ordered for an additional 500 cc of fluid, for total greater than 30 cc/kg of IV fluid.  X-Amybeth Sieg concerning for pneumonia, will add on azithromycin .  Will exchange Foley and send sample from new catheter for urine culture.  Do think patient is appropriate for admission for further evaluation of his weakness with sepsis, electrolyte derangements.  We reach out to hospitalist team.  Case was reviewed with Dr. Vallarie Gauze with hospitalist team who will evaluate the patient for anticipated admission.  Update: Around 8:30 PM, I was notified by RN Ace Abu that patient had an acute change in clinical status while awaiting initial evaluation from admitting team.  Patient had been awake, talking when he started to complain of shortness of breath and became less responsive  with development of cyanosis.  He was brought into a room and I was called to bedside.  Patient was noted to be cyanotic with agonal respirations.  Initially with faint pulse that then became undetectable.  CPR was initiated.  Patient was given epinephrine  and had 1 round of compressions.  At pulse check, patient did have a detectable pulse, 2-minute downtime.  Initially bradycardic, given atropine .  Heart rate improved to the 80s.  Despite multiple attempts, it was very difficult to obtain a O2 sat  on the patient, but I was concerned for hypoxia as the etiology of his arrest.  He was also given calcium , bicarbonate, magnesium  with hyperkalemia on labs earlier today and unknown magnesium  but significant ectopy post ROSC.  Decision was made to proceed with intubation as above. He did have some postintubation hypotension for which Levophed  was ordered.  A CODE BLUE was activated for the patient as he did have planned admission to the hospital.  With this, hospitalist physician and ICU NP both presented to bedside.  Case was reviewed with both of these teams and care was transitioned to ICU.  Dr. Vallarie Gauze with hospitalist team was updated on patient's clinical status and plan for ICU admission.  Patient's wife was updated by myself and NP [place] with hospitalist team.  Further management per ICU team.    FINAL CLINICAL IMPRESSION(S) / ED DIAGNOSES   Final diagnoses:  Generalized weakness  Pneumonia due to infectious organism, unspecified laterality, unspecified part of lung  Acute cystitis without hematuria     Rx / DC Orders   ED Discharge Orders     None        Note:  This document was prepared using Dragon voice recognition software and may include unintentional dictation errors.   Claria Crofts, MD 10/09/23 2112    Claria Crofts, MD 10/09/23 2132

## 2023-10-09 NOTE — Progress Notes (Signed)
 PHARMACY CONSULT NOTE - ELECTROLYTES  Pharmacy Consult for Electrolyte Monitoring and Replacement   Recent Labs: Height: 6' (182.9 cm) Weight: 56.2 kg (124 lb) IBW/kg (Calculated) : 77.6 Estimated Creatinine Clearance: 28.2 mL/min (A) (by C-G formula based on SCr of 1.58 mg/dL (H)). Potassium (mmol/L)  Date Value  10/09/2023 6.1 (H)   Magnesium  (mg/dL)  Date Value  29/56/2130 2.0   Calcium  (mg/dL)  Date Value  86/57/8469 8.9   Albumin  (g/dL)  Date Value  62/95/2841 3.6   Phosphorus (mg/dL)  Date Value  32/44/0102 2.9   Sodium (mmol/L)  Date Value  10/09/2023 124 (L)   Assessment  Juan [name] is a 83 y.o. male presenting with weakness after outpatient diagnosis of UTI on 10/08/23. 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 Pharmacy has been consulted to monitor and replace electrolytes.  Diet: NPO MIVF: N/A Pertinent medications: None  Goal of Therapy: Electrolytes WNL  Plan:  K 6.1 on admission Provider ordered lasix  40 mg x1, insulin  aspart 5 units IV x1 with dextrose , and Lokelma  10 g x1 Check BMP, Mg, Phos with AM labs  Thank you for allowing pharmacy to be a part of this patient's care.  Ananias Balls, PharmD Clinical Pharmacist 10/09/2023 8:55 PM

## 2023-10-09 NOTE — Sepsis Progress Note (Signed)
 Notified bedside nurse of need to draw repeat lactic acid.

## 2023-10-09 NOTE — IPAL (Signed)
  Interdisciplinary Goals of Care Family Meeting   Date carried out: 10/09/2023  Location of the meeting: Bedside  Member's involved: Nurse Practitioner, Bedside Registered Nurse, and Family Member or next of kin  Durable Power of Attorney or acting medical decision maker: Spouse,  Juan [name] & Daughter, Juan [name]  Discussion: We discussed goals of care for Juan [name]. While awaiting admission the patient had a brief cardiac arrest in the ED, was emergently intubated requiring mechanical ventilatory support and stabilized briefly. 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 Due to grave prognosis and HIGH RISK for imminent cardiac arrest and death, discussed recommendations for DNR code status. Family in agreement, code status changed  Code status:   Code Status: Do not attempt resuscitation (DNR) PRE-ARREST INTERVENTIONS DESIRED   Disposition: Continue current acute care  Time spent for the meeting: 25 minutes    Betsey Holiday, AGACNP-BC Acute Care Nurse Practitioner [facility]   [phone] / [phone] Please see Amion for details.

## 2023-10-09 NOTE — Code Documentation (Signed)
 Critical care and RT at bedside

## 2023-10-09 NOTE — Progress Notes (Signed)
 CODE SEPSIS - PHARMACY COMMUNICATION  **Broad Spectrum Antibiotics should be administered within 1 hour of Sepsis diagnosis**  Time Code Sepsis Called/Page Received: 1629  Antibiotics Ordered: Ceftriaxone   Time of 1st antibiotic administration: 1658  Additional action taken by pharmacy: N/A  Page Boast 10/09/2023  5:27 PM

## 2023-10-09 NOTE — ED Notes (Signed)
 Pt taken to CT.

## 2023-10-09 NOTE — ED Triage Notes (Signed)
 Wife reports patient hasn't been doing well for about a week. Reports weakness. Diagnosed with UTI yesterday and has had antibiotic X2.   Wife reports yesterday he lost control of his bowels which is off baseline. Family was unable to get patient off the couch today to go to doctors office and then called EMS.   Little po intake. Denies N/V  See first nurse note for v/s

## 2023-10-09 NOTE — ED Notes (Signed)
 Critical Result: Lactic 3.0  Ray, MD aware

## 2023-10-09 NOTE — Progress Notes (Signed)
 Pt transported to CT then to ICU 20 on the vent without incident. Pt remains on the vent and is tol well at this time. Report given to the ICU RT.

## 2023-10-10 ENCOUNTER — Other Ambulatory Visit: Payer: PPO

## 2023-10-10 DIAGNOSIS — R570 Cardiogenic shock: Secondary | ICD-10-CM | POA: Diagnosis not present

## 2023-10-10 DIAGNOSIS — R579 Shock, unspecified: Secondary | ICD-10-CM | POA: Diagnosis not present

## 2023-10-10 LAB — COOXEMETRY PANEL
Carboxyhemoglobin: 1 % (ref 0.5–1.5)
Carboxyhemoglobin: 1.1 % (ref 0.5–1.5)
Methemoglobin: <0.7 % (ref 0.0–1.5)
Methemoglobin: <0.7 % (ref 0.0–1.5)
O2 Saturation: 69.6 %
O2 Saturation: 69.2 %
Total hemoglobin: 11.9 g/dL — ABNORMAL LOW (ref 12.0–16.0)
Total hemoglobin: 11.7 g/dL — ABNORMAL LOW (ref 12.0–16.0)
Total oxygen content: 68.6 %
Total oxygen content: 68.4 %

## 2023-10-10 LAB — BLOOD GAS, VENOUS
Acid-base deficit: 13.6 mmol/L — ABNORMAL HIGH (ref 0.0–2.0)
Acid-base deficit: 5.9 mmol/L — ABNORMAL HIGH (ref 0.0–2.0)
Bicarbonate: 15.9 mmol/L — ABNORMAL LOW (ref 20.0–28.0)
Bicarbonate: 21.9 mmol/L (ref 20.0–28.0)
FIO2: 100 %
FIO2: 1 %
MECHVT: 600 mL
MECHVT: 600 mL
Mechanical Rate: 16
O2 Saturation: 71.3 %
O2 Saturation: 75.5 %
PEEP: 5 cmH2O
PEEP: 5 cmH2O
Patient temperature: 37
Patient temperature: 37
RATE: 16 {breaths}/min
pCO2, Ven: 50 mm[Hg] (ref 44–60)
pCO2, Ven: 51 mm[Hg] (ref 44–60)
pH, Ven: 7.11 — CL (ref 7.25–7.43)
pH, Ven: 7.24 — ABNORMAL LOW (ref 7.25–7.43)
pO2, Ven: 48 mm[Hg] — ABNORMAL HIGH (ref 32–45)
pO2, Ven: 49 mm[Hg] — ABNORMAL HIGH (ref 32–45)

## 2023-10-10 LAB — TROPONIN I (HIGH SENSITIVITY)
Troponin I (High Sensitivity): 84 ng/L — ABNORMAL HIGH (ref <18)
Troponin I (High Sensitivity): 134 ng/L (ref <18)
Troponin I (High Sensitivity): 233 ng/L (ref <18)

## 2023-10-10 LAB — CBC
HCT: 34.1 % — ABNORMAL LOW (ref 39.0–52.0)
HCT: 35.8 % — ABNORMAL LOW (ref 39.0–52.0)
Hemoglobin: 11.2 g/dL — ABNORMAL LOW (ref 13.0–17.0)
Hemoglobin: 11.6 g/dL — ABNORMAL LOW (ref 13.0–17.0)
MCH: 33.5 pg (ref 26.0–34.0)
MCH: 34 pg (ref 26.0–34.0)
MCHC: 32.4 g/dL (ref 30.0–36.0)
MCHC: 32.8 g/dL (ref 30.0–36.0)
MCV: 102.1 fL — ABNORMAL HIGH (ref 80.0–100.0)
MCV: 105 fL — ABNORMAL HIGH (ref 80.0–100.0)
Platelets: 131 10*3/uL — ABNORMAL LOW (ref 150–400)
Platelets: 141 10*3/uL — ABNORMAL LOW (ref 150–400)
RBC: 3.34 MIL/uL — ABNORMAL LOW (ref 4.22–5.81)
RBC: 3.41 MIL/uL — ABNORMAL LOW (ref 4.22–5.81)
RDW: 14 % (ref 11.5–15.5)
RDW: 14.1 % (ref 11.5–15.5)
WBC: 10.7 10*3/uL — ABNORMAL HIGH (ref 4.0–10.5)
WBC: 20.9 10*3/uL — ABNORMAL HIGH (ref 4.0–10.5)
nRBC: 0.2 % (ref 0.0–0.2)
nRBC: 0.5 % — ABNORMAL HIGH (ref 0.0–0.2)

## 2023-10-10 LAB — BASIC METABOLIC PANEL
Anion gap: 14 (ref 5–15)
Anion gap: 17 — ABNORMAL HIGH (ref 5–15)
BUN: 41 mg/dL — ABNORMAL HIGH (ref 8–23)
BUN: 44 mg/dL — ABNORMAL HIGH (ref 8–23)
CO2: 15 mmol/L — ABNORMAL LOW (ref 22–32)
CO2: 19 mmol/L — ABNORMAL LOW (ref 22–32)
Calcium: 8.2 mg/dL — ABNORMAL LOW (ref 8.9–10.3)
Calcium: 8.1 mg/dL — ABNORMAL LOW (ref 8.9–10.3)
Chloride: 98 mmol/L (ref 98–111)
Chloride: 95 mmol/L — ABNORMAL LOW (ref 98–111)
Creatinine, Ser: 1.8 mg/dL — ABNORMAL HIGH (ref 0.61–1.24)
Creatinine, Ser: 1.91 mg/dL — ABNORMAL HIGH (ref 0.61–1.24)
GFR, Estimated: 37 mL/min — ABNORMAL LOW (ref >60)
GFR, Estimated: 34 mL/min — ABNORMAL LOW (ref >60)
Glucose, Bld: 256 mg/dL — ABNORMAL HIGH (ref 70–99)
Glucose, Bld: 191 mg/dL — ABNORMAL HIGH (ref 70–99)
Potassium: 4.4 mmol/L (ref 3.5–5.1)
Potassium: 4.2 mmol/L (ref 3.5–5.1)
Sodium: 127 mmol/L — ABNORMAL LOW (ref 135–145)
Sodium: 131 mmol/L — ABNORMAL LOW (ref 135–145)

## 2023-10-10 LAB — D-DIMER, QUANTITATIVE: D-Dimer, Quant: 4.64 ug{FEU}/mL — ABNORMAL HIGH (ref 0.00–0.50)

## 2023-10-10 LAB — BLOOD GAS, ARTERIAL
Acid-base deficit: 12.6 mmol/L — ABNORMAL HIGH (ref 0.0–2.0)
Bicarbonate: 15.7 mmol/L — ABNORMAL LOW (ref 20.0–28.0)
FIO2: 100 %
MECHVT: 600 mL
Mechanical Rate: 16
O2 Saturation: 100 %
PEEP: 5 cmH2O
Patient temperature: 37
pCO2 arterial: 44 mm[Hg] (ref 32–48)
pH, Arterial: 7.16 — CL (ref 7.35–7.45)
pO2, Arterial: 258 mm[Hg] — ABNORMAL HIGH (ref 83–108)

## 2023-10-10 LAB — PHOSPHORUS: Phosphorus: 7.1 mg/dL — ABNORMAL HIGH (ref 2.5–4.6)

## 2023-10-10 LAB — TYPE AND SCREEN
ABO/RH(D): O NEG
Antibody Screen: NEGATIVE

## 2023-10-10 LAB — FIBRINOGEN: Fibrinogen: 330 mg/dL (ref 210–475)

## 2023-10-10 LAB — PROCALCITONIN
Procalcitonin: 0.45 ng/mL
Procalcitonin: 0.45 ng/mL

## 2023-10-10 LAB — HEMOGLOBIN A1C
Hgb A1c MFr Bld: 6 % — ABNORMAL HIGH (ref 4.8–5.6)
Mean Plasma Glucose: 125.5 mg/dL

## 2023-10-10 LAB — ABO/RH: ABO/RH(D): O NEG

## 2023-10-10 LAB — PROTIME-INR
INR: 2.2 — ABNORMAL HIGH (ref 0.8–1.2)
Prothrombin Time: 24.4 s — ABNORMAL HIGH (ref 11.4–15.2)

## 2023-10-10 LAB — TRIGLYCERIDES: Triglycerides: 86 mg/dL (ref <150)

## 2023-10-10 LAB — HEPATIC FUNCTION PANEL
ALT: 827 U/L — ABNORMAL HIGH (ref 0–44)
AST: 1010 U/L — ABNORMAL HIGH (ref 15–41)
Albumin: 3 g/dL — ABNORMAL LOW (ref 3.5–5.0)
Alkaline Phosphatase: 62 U/L (ref 38–126)
Bilirubin, Direct: 0.4 mg/dL — ABNORMAL HIGH (ref 0.0–0.2)
Indirect Bilirubin: 0.4 mg/dL (ref 0.3–0.9)
Total Bilirubin: 0.8 mg/dL (ref 0.0–1.2)
Total Protein: 5.2 g/dL — ABNORMAL LOW (ref 6.5–8.1)

## 2023-10-10 LAB — LACTIC ACID, PLASMA
Lactic Acid, Venous: 6.4 mmol/L (ref 0.5–1.9)
Lactic Acid, Venous: 6.4 mmol/L (ref 0.5–1.9)
Lactic Acid, Venous: 4.5 mmol/L (ref 0.5–1.9)

## 2023-10-10 LAB — GLUCOSE, CAPILLARY
Glucose-Capillary: 223 mg/dL — ABNORMAL HIGH (ref 70–99)
Glucose-Capillary: 117 mg/dL — ABNORMAL HIGH (ref 70–99)

## 2023-10-10 MED ORDER — GLYCOPYRROLATE 0.2 MG/ML IJ SOLN
0.2000 mg | INTRAMUSCULAR | Status: DC | PRN
Start: 2023-10-10 — End: 2023-10-10

## 2023-10-10 MED ORDER — SODIUM BICARBONATE 8.4 % IV SOLN
INTRAVENOUS | Status: AC
  Filled 2023-10-10: qty 50

## 2023-10-10 MED ORDER — SODIUM CHLORIDE 0.9 % IV SOLN: INTRAVENOUS | Status: DC

## 2023-10-10 MED ORDER — PIPERACILLIN-TAZOBACTAM 3.375 G IVPB
3.3750 g | Freq: Three times a day (TID) | INTRAVENOUS | Status: DC
  Administered 2023-10-10 (×2): 3.375 g via INTRAVENOUS
  Filled 2023-10-10 (×2): qty 50

## 2023-10-10 MED ORDER — AMIODARONE HCL IN DEXTROSE 360-4.14 MG/200ML-% IV SOLN
60.0000 mg/h | INTRAVENOUS | Status: DC
  Administered 2023-10-10: 60 mg/h via INTRAVENOUS
  Filled 2023-10-10: qty 200

## 2023-10-10 MED ORDER — POLYVINYL ALCOHOL 1.4 % OP SOLN: 1.0000 [drp] | Freq: Four times a day (QID) | OPHTHALMIC | Status: DC | PRN

## 2023-10-10 MED ORDER — ORAL CARE MOUTH RINSE: 15.0000 mL | OROMUCOSAL | Status: DC | PRN

## 2023-10-10 MED ORDER — FENTANYL BOLUS VIA INFUSION: 100.0000 ug | INTRAVENOUS | Status: DC | PRN

## 2023-10-10 MED ORDER — ACETAMINOPHEN 650 MG RE SUPP: 650.0000 mg | Freq: Four times a day (QID) | RECTAL | Status: DC | PRN

## 2023-10-10 MED ORDER — HALOPERIDOL LACTATE 5 MG/ML IJ SOLN: 2.5000 mg | INTRAMUSCULAR | Status: DC | PRN

## 2023-10-10 MED ORDER — SODIUM BICARBONATE 8.4 % IV SOLN
50.0000 meq | Freq: Once | INTRAVENOUS | Status: AC
  Administered 2023-10-10: 50 meq via INTRAVENOUS

## 2023-10-10 MED ORDER — GLYCOPYRROLATE 1 MG PO TABS: 1.0000 mg | ORAL_TABLET | ORAL | Status: DC | PRN

## 2023-10-10 MED ORDER — LORAZEPAM 2 MG/ML IJ SOLN: 2.0000 mg | INTRAMUSCULAR | Status: DC | PRN

## 2023-10-10 MED ORDER — ORAL CARE MOUTH RINSE
15.0000 mL | OROMUCOSAL | Status: DC
  Administered 2023-10-10: 15 mL via OROMUCOSAL

## 2023-10-10 MED ORDER — ACETAMINOPHEN 325 MG PO TABS: 650.0000 mg | ORAL_TABLET | Freq: Four times a day (QID) | ORAL | Status: DC | PRN

## 2023-10-10 MED ORDER — INSULIN ASPART 100 UNIT/ML IJ SOLN
0.0000 [IU] | INTRAMUSCULAR | Status: DC
  Administered 2023-10-10: 3 [IU] via SUBCUTANEOUS
  Filled 2023-10-10: qty 1

## 2023-10-10 MED ORDER — SODIUM CHLORIDE 0.9% IV SOLUTION: Freq: Once | INTRAVENOUS | Status: AC

## 2023-10-10 MED ORDER — FENTANYL 2500MCG IN NS 250ML (10MCG/ML) PREMIX INFUSION
0.0000 ug/h | INTRAVENOUS | Status: DC
Start: 2023-10-10 — End: 2023-10-10

## 2023-10-11 ENCOUNTER — Other Ambulatory Visit: Payer: PPO | Admitting: Urology

## 2023-10-11 LAB — URINE CULTURE: Culture: 80000 — AB

## 2023-10-12 LAB — BPAM FFP
Blood Product Expiration Date: 202501192359
ISSUE DATE / TIME: 202501160142
Unit Type and Rh: 6200

## 2023-10-12 LAB — PREPARE FRESH FROZEN PLASMA: Unit division: 0

## 2023-10-14 LAB — CULTURE, BLOOD (ROUTINE X 2)
Culture: NO GROWTH
Culture: NO GROWTH
Special Requests: ADEQUATE
Special Requests: ADEQUATE

## 2023-10-15 ENCOUNTER — Ambulatory Visit: Admission: RE | Admit: 2023-10-15 | Payer: PPO | Source: Home / Self Care | Admitting: Internal Medicine

## 2023-10-15 SURGERY — TRANSESOPHAGEAL ECHOCARDIOGRAM (TEE)
Anesthesia: General

## 2023-10-16 ENCOUNTER — Ambulatory Visit: Payer: PPO

## 2023-10-26 NOTE — Consult Note (Addendum)
Advanced Heart Failure Team Consult Note   Primary Physician: Marguarite Arbour, MD Cardiologist:  [facility]  Reason for Consultation: Combined septic and cardiogenic shock  HPI:    Day Juan [name] is seen today for evaluation of combined septic and cardiogenic shock at the request of Juan [name], [facility].   Juan [name] is a 83 y.o. 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 Follows with [facility].   He was admitted 11/24 with AMS 2/2 UTI. He was found to be in a fib RVR. He was discharged on doxycycline with foley and GU f/u. DCCV was planned but rates improved so it was aborted. On digoxin and metoprolol for rate control.   Urology f/u's: foley removed 12/5, replaced 12/6 2/2 retention. Removed again 12/17. Replaced 12/18.   History obtained from chart review and wife at bedside, patient intubated and sedated. Presented to [facility] ED yesterday with weakness and confusion. Wife reports intt SOB that they thought was 2/2 a fib. He was diagnosed with UTI 2 days prior and had started 2 days of abx. Admission labs reviewed: co-ox 30%, Lactic acid 3>7.4>6.4>4.5, UA + UTI, Resp panel (-), EKG with a fib RVR 140s, BNP 2.9K, HsTrop 60>454>098, SCr 1.58 (baseline 1), Na 124, K 6.1, AST 254, ALT 220. CT showed small retroperitoneal hematoma. While in the ED he briefly went unresponsive and required CPR for 3-4 mins, intubated/sedated and transferred to ICU. He was hypotensive, he was started on vaso, NE, Epi and milrinone. PCCM consulted. Sepsis protocol initiated, got 1.5L IVF. IPAL discussion today, patient now DNR.   Now in ICU. Intubated/sedated in bed, wife at bedside. Currently on amio @60 /hr, Epi @10 , milrinone @ 0.125, NE @ 26, vaso @ 0.04.  Per wife, they request no further escalation of care, declined echo  earlier today. States he's had a rough year and continues to progressively decline.   Cardiac studies reviewed:  - Echo 11/24: EF 25-30%, LV with GHK, mild LVH, RV normal, LA severely dilated, RA mildly dilated, no MR, severe mitral annular calcification, mod TR, mod pulmonic valve regurgitation, mod dilated pulm artery -LHC 3/16: occluded right coronary with an occluded saphenous vein graft to the right coronary filled by collaterals.  The remainder of his anatomy revealed no significant disease  - Echo 7/15: EF 20% with a severe global decrease in contractility, prosthetic MV ring, mild LAE, mild RVE, moderate RAE, moderate AR/MR/TR/PR. RVSP 48.7 mmHg.   Home Medications Prior to Admission medications   Medication Sig Start Date End Date Taking? Authorizing Provider  apixaban (ELIQUIS) 5 MG TABS tablet Take 5 mg by mouth 2 (two) times daily. 11/13/17  Yes [provider]  B Complex-C-E-Zn (B COMPLEX-C-E-ZINC) tablet Take 1 tablet by mouth daily.   Yes [provider]  cholecalciferol (VITAMIN D) 1000 units tablet Take 2,000 Units by mouth daily.   Yes [provider]  doxycycline (VIBRAMYCIN) 100 MG capsule Take 100 mg by mouth at bedtime.  11/11/17  Yes [provider]  ezetimibe (ZETIA) 10 MG tablet Take 10 mg by mouth at bedtime.  05/28/17  Yes [provider]  folic acid (FOLVITE) 1 MG tablet Take 5 mg by mouth daily.   Yes [provider]  MAGNESIUM GLUCONATE PO Take 350 mg by mouth daily. Take 115mg  tablet daily.   Yes [provider]  metoprolol tartrate (LOPRESSOR) 25 MG tablet Take 1 tablet (25 mg total) by mouth 2 (two) times daily. 08/06/23 08/05/24 Yes Gillis Santa, MD  metoprolol tartrate (LOPRESSOR) 50 MG tablet Take 50 mg by mouth 2 (two) times daily. 10/07/23  Yes [provider]  polyethylene glycol (MIRALAX / GLYCOLAX) 17 g packet Take 17 g by mouth daily. 08/07/23  Yes Gillis Santa, MD  Potassium 99 MG TABS  Take 99 mg by mouth daily.   Yes [provider]  selenium sulfide (SELSUN) 2.5 % shampoo Apply 1 application topically daily. 05/26/19  Yes [provider]  silodosin (RAPAFLO) 8 MG CAPS capsule Take 1 capsule (8 mg total) by mouth daily with breakfast. 08/29/23  Yes Stoioff, Verna Czech, MD  tamsulosin (FLOMAX) 0.4 MG CAPS capsule Take 0.4 mg by mouth.   Yes [provider]  vitamin E 1000 UNIT capsule Take 1,000 Units by mouth daily. Lunch   Yes [provider]  bisacodyl (DULCOLAX) 5 MG EC tablet Take 2 tablets (10 mg total) by mouth at bedtime. 08/06/23   Gillis Santa, MD  colesevelam [facility]) 625 MG tablet Take 1,875 mg by mouth 2 (two) times daily.  Patient not taking: Reported on  11/26/17   [provider]  methotrexate (RHEUMATREX) 2.5 MG tablet Take 10 mg by mouth once a week. Patient not taking: Reported on     [provider]    Past Medical History: Past Medical History:  Diagnosis Date   Arthritis    back- low   Atrial fibrillation (HCC)    Cancer (HCC)    skin ( basal) cell , face, head, back, inside L ear   CHF (congestive heart failure) (HCC)    Coronary artery disease    Dysrhythmia    MI (myocardial infarction) (HCC)    Sleep apnea    +Cpap in use nightly , last study- 10 yrs. ago   Stroke [facility])    "light stroke"    Past Surgical History: Past Surgical History:  Procedure Laterality Date   APPENDECTOMY     BACK SURGERY  09/2011   lumbar fusion    CARDIAC CATHETERIZATION     CARDIAC SURGERY     CARDIAC VALVE REPLACEMENT     CORONARY ARTERY BYPASS GRAFT     EYE SURGERY Bilateral    catarascts removed-    HERNIA REPAIR Bilateral 1946, 1990's   inguinal x3 procedures    LUMBAR LAMINECTOMY/DECOMPRESSION MICRODISCECTOMY Left 08/14/2019   Procedure: Laminectomy for facet/synovial cyst - left - Lumbar three-Lumbar four;  Surgeon: Julio Sicks, MD;  Location: [facility] OR;  Service: Neurosurgery;   Laterality: Left;   TONSILLECTOMY      Family History: History reviewed. No pertinent family history.  Social History: Social History   Socioeconomic History   Marital status: Married    Spouse name: Not on file   Number of children: Not on file   Years of education: Not on file   Highest education level: Not on file  Occupational History   Not on file  Tobacco Use   Smoking status: Never    Passive exposure: Never   Smokeless tobacco: Never  Vaping Use   Vaping status: Never Used  Substance and Sexual Activity   Alcohol use: No   Drug use: No   Sexual activity: Not on file  Other Topics Concern   Not on file  Social History Narrative   Not on file   Social Drivers of Health  Financial Resource Strain: Low Risk  (08/14/2023)   Received [facility]   Overall Financial Resource Strain (CARDIA)    Difficulty of Paying Living Expenses: Not hard at all  Food Insecurity: No Food Insecurity (08/14/2023)   Received from [facility]   Hunger Vital Sign    Worried About Running Out of Food in the Last Year: Never true    Ran Out of Food in the Last Year: Never true  Transportation Needs: No Transportation Needs (08/14/2023)   Received from [facility] - Transportation    In the past 12 months, has lack of transportation kept you from medical appointments or from getting medications?: No    Lack of Transportation (Non-Medical): No  Physical Activity: Not on file  Stress: Not on file  Social Connections: Not on file    Allergies:  Allergies  Allergen Reactions   Prednisone Other (See Comments)    Developed afib after use.    Objective:    Vital Signs:   Temp:  [95 F (35 C)-99.1 F (37.3 C)] 99.1 F (37.3 C) (01/16 0730) Pulse Rate:  [25-159] 104 (01/16 0415) Resp:  [14-22] 16 (01/16 0730) BP: (36-157)/(13-141) 99/63 (01/16 0715) SpO2:  [15 %-100 %] 100 % (01/16 0700) FiO2 (%):  [50 %-100  %] 100 % (01/16 0700) Weight:  [56.2 kg-69.8 kg] 69.8 kg (01/16 0500)    Weight change: Filed Weights   10/09/23 1406 10/09/23 2230  0500  Weight: 56.2 kg 69.8 kg 69.8 kg    Intake/Output:   Intake/Output Summary (Last 24 hours) at  0829 Last data filed at  0716 Gross per 24 hour  Intake 3835.95 ml  Output 460 ml  Net 3375.95 ml      Physical Exam    General:  intubated and sedated HEENT: +ETT, +OG Neck: supple. JVD unable to see. Carotids 2+ bilat; no bruits. No lymphadenopathy or thyromegaly appreciated.RIJ CVC Cor: PMI nondisplaced. Irregular rate & rhythm. No rubs, gallops or murmurs. Lungs: clear Abdomen: soft, nontender, nondistended. No hepatosplenomegaly. No bruits or masses. Good bowel sounds. Extremities: no cyanosis, clubbing, rash, edema  GU: +foley Neuro: intubated and sedated  Telemetry   Atrial fibrillation 140s, int NSVT runs (Personally reviewed)    EKG    Atrial fibrillation with RVR 140 bpm  Labs   Basic Metabolic Panel: Recent Labs  Lab 10/09/23 1415 10/09/23 2120  0106  0520  NA 124* 127* 127* 131*  K 6.1* 4.8 4.4 4.2  CL 96* 100 98 95*  CO2 17* 13* 15* 19*  GLUCOSE 144* 209* 256* 191*  BUN 41* 40* 41* 44*  CREATININE 1.58* 1.64* 1.80* 1.91*  CALCIUM 8.9 9.3 8.2* 8.1*  MG  --  2.5*  --   --   PHOS  --  6.6*  --  7.1*    Liver Function Tests: Recent Labs  Lab 10/09/23 1415 10/09/23 2120  0520  AST 207* 254* 1,010*  ALT 165* 220* 827*  ALKPHOS 55 50 62  BILITOT 1.4* 0.9 0.8  PROT 6.0* 5.3* 5.2*  ALBUMIN 3.6 3.0* 3.0*   No results for input(s): "LIPASE", "AMYLASE" in the last 168 hours. No results for input(s): "AMMONIA" in the last 168 hours.  CBC: Recent Labs  Lab 10/09/23 1415 10/09/23 2120 10/09/23 2344  0520  WBC 8.6 9.5 10.7* 20.9*  NEUTROABS 7.6 7.6  --   --   HGB 12.5* 11.7* 11.6* 11.2*  HCT 37.7*  37.8* 35.8* 34.1*  MCV 99.5 107.4* 105.0* 102.1*   PLT 143* 129* 131* 141*    Cardiac Enzymes: No results for input(s): "CKTOTAL", "CKMB", "CKMBINDEX", "TROPONINI" in the last 168 hours.  BNP: BNP (last 3 results) Recent Labs    10/09/23 2120  BNP 2,892.2*    ProBNP (last 3 results) No results for input(s): "PROBNP" in the last 8760 hours.   CBG: Recent Labs  Lab 10/09/23 2109 10/09/23 2213  0338  0740  GLUCAP 159* 93 223* 117*    Coagulation Studies: Recent Labs    10/09/23 1415 10/09/23 2120  0520  LABPROT 24.0* 25.7* 24.4*  INR 2.1* 2.3* 2.2*     Imaging   DG Chest Port 1 View Result Date: 10/09/2023 CLINICAL DATA:  Central line placement. EXAM: PORTABLE CHEST 1 VIEW COMPARISON:  Earlier today however radiograph and CT FINDINGS: Tip of the right internal jugular central venous catheter overlies the lower SVC. No pneumothorax. Endotracheal tube tip at the level of the thoracic inlet. Enteric tube side port below the diaphragm. Stable cardiomegaly, prosthetic cardiac valve. Increasing retrocardiac opacity. There are hazy bilateral lung opacities correspond to layering effusions. IMPRESSION: 1. Right internal jugular central venous catheter tip overlies the lower SVC. No pneumothorax. 2. Increasing retrocardiac opacity. Hazy bilateral lung opacities representing layering effusions. Electronically Signed   By: Narda Rutherford M.D.   On: 10/09/2023 23:51   CT ABDOMEN PELVIS W CONTRAST Result Date: 10/09/2023 CLINICAL DATA:  Sepsis weakness EXAM: CT ABDOMEN AND PELVIS WITH CONTRAST TECHNIQUE: Multidetector CT imaging of the abdomen and pelvis was performed using the standard protocol following bolus administration of intravenous contrast. RADIATION DOSE REDUCTION: This exam was performed according to the departmental dose-optimization program which includes automated exposure control, adjustment of the mA and/or kV according to patient size and/or use of iterative reconstruction technique.  CONTRAST:  75mL OMNIPAQUE IOHEXOL 350 MG/ML SOLN COMPARISON:  None Available. FINDINGS: Lower chest: Moderate right and small left pleural effusions. Partial lower lobe consolidations probably due to atelectasis. Coronary vascular calcification and cardiomegaly. Hepatobiliary: No calcified gallstone. Periportal edema. Mild luminal hyperdensity at the gallbladder probably sludge. Suspect marked gallbladder wall thickening. Pancreas: Unremarkable. No pancreatic ductal dilatation or surrounding inflammatory changes. Spleen: Normal in size without focal abnormality. Adrenals/Urinary Tract: Adrenal glands are within normal limits. Kidneys show no hydronephrosis. Multiple right renal cysts for which no imaging follow-up is recommended. Nonspecific perinephric stranding. Catheter in the bladder. Bladder is decompressed and diffusely thick walled. Stomach/Bowel: Enteric tube tip at the GE junction. Fluid-filled mid to distal small bowel without well demonstrated transition point. No acute bowel wall thickening. Moderate colonic distension. Poorly identified appendix Vascular/Lymphatic: Advanced aortic atherosclerosis. No aneurysm. No suspicious lymph nodes Reproductive: Prostate slightly enlarged Other: Negative for free air. Generalized subcutaneous edema consistent with anasarca. Generalized mesenteric edema. Small volume free fluid in the upper abdomen. Slightly hyperdense heterogenous fluid in the right Peri renal space and retroperitoneum extending along the right psoas suspect for a hematoma, with slightly more dense area along the posterior aspect of the inferior right kidney. No convincing extravasation on this exam. Evidence of prior ventral hernia repair. Small fat containing right inguinal hernia Musculoskeletal: Left posterior lumbar fusion hardware and interbody device at L4-L5. No acute osseous abnormality IMPRESSION: 1. Slightly dense small moderate heterogeneous collection within the right perinephric space  and retroperitoneum, suspicious for retroperitoneal hematoma. No convincing acute extravasation on this exam. 2. Moderate right and small left pleural effusions with partial lower lobe consolidations probably  due to atelectasis. Cardiomegaly with generalized mesenteric edema, anasarca, and small volume ascites. 3. Enteric tube tip at the GE junction, recommend advancement 4. Fluid-filled mid to distal small bowel without well demonstrated transition point, possible ileus. 5. Diffusely thick-walled urinary bladder decompressed by Foley catheter, correlate for cystitis. Aortic Atherosclerosis (ICD10-I70.0). Electronically Signed   By: Jasmine Pang M.D.   On: 10/09/2023 22:48   CT HEAD WO CONTRAST ( ) Result Date: 10/09/2023 CLINICAL DATA:  Mental status change EXAM: CT HEAD WITHOUT CONTRAST TECHNIQUE: Contiguous axial images were obtained from the base of the skull through the vertex without intravenous contrast. RADIATION DOSE REDUCTION: This exam was performed according to the departmental dose-optimization program which includes automated exposure control, adjustment of the mA and/or kV according to patient size and/or use of iterative reconstruction technique. COMPARISON:  08/04/2023 FINDINGS: Brain: No acute territorial infarction, hemorrhage or intracranial mass. Chronic left cerebellar and occipital infarcts. Interval small right posterior frontal infarct, which also appears chronic but is new compared with November 2024 CT. Atrophy and chronic small vessel ischemic changes of the white matter. Stable ventricle size. Vascular: No hyperdense vessels.  Carotid vascular calcification Skull: Normal. Negative for fracture or focal lesion. Sinuses/Orbits: No acute finding. Other: None IMPRESSION: 1. No CT evidence for acute intracranial abnormality. 2. Atrophy and chronic small vessel ischemic changes of the white matter. Chronic left cerebellar and occipital infarcts. Interval small right posterior frontal  infarct, which also appears chronic but is new compared with November 2024 CT. Electronically Signed   By: Jasmine Pang M.D.   On: 10/09/2023 22:36   CT Angio Chest Pulmonary Embolism (PE) W or WO Contrast Result Date: 10/09/2023 CLINICAL DATA:  UTI mental status change weakness EXAM: CT ANGIOGRAPHY CHEST WITH CONTRAST TECHNIQUE: Multidetector CT imaging of the chest was performed using the standard protocol during bolus administration of intravenous contrast. Multiplanar CT image reconstructions and MIPs were obtained to evaluate the vascular anatomy. RADIATION DOSE REDUCTION: This exam was performed according to the departmental dose-optimization program which includes automated exposure control, adjustment of the mA and/or kV according to patient size and/or use of iterative reconstruction technique. CONTRAST:  75mL OMNIPAQUE IOHEXOL 350 MG/ML SOLN COMPARISON:  Chest x-ray 10/09/2023 FINDINGS: Cardiovascular: Moderate aortic atherosclerosis. No aneurysm. Post CABG changes. Heterogeneous opacification of the main pulmonary arteries suspected to be secondary to mixing artifact with similar appearing hypodensities extending into the descending bilateral lobar arteries. Essentially non opacifications of the lower lobe segmental and subsegmental vessels and non opacification of the distal right lower lobe pulmonary arteries. Some opacification noted on delayed views of the included lower chest on the abdomen exam. Coronary vascular calcification. Marked cardiomegaly with multi chamber enlargement. Reflux of contrast into the hepatic veins consistent with elevated right heart pressures. Mitral valve prosthesis. Mediastinum/Nodes: Endotracheal tube tip couple cm superior to the carina. Esophageal tube tip at the GE junction. No suspicious lymph nodes. Lungs/Pleura: Moderate right and small moderate left pleural effusion. Bilateral lower lobe partial consolidations. No pneumothorax. Upper Abdomen: See separately  dictated CT Musculoskeletal: Post sternotomy changes. No acute osseous abnormality. Review of the MIP images confirms the above findings. IMPRESSION: 1. Very limited study for assessment of pulmonary embolus due to heterogeneous opacification of the main pulmonary arteries suspected to be secondary to mixing artifact with similar appearing hypodensities extending into the descending bilateral lobar arteries as well as right lower lobe segmental and subsegmental vessels; essentially non opacification of the lower lobe segmental and subsegmental vessels and non opacification of  the distal right lower lobe pulmonary artery, thereby limiting assessment for pulmonary embolus. There is however some opacifications of lower lobe pulmonary vessels on the included portions of lower chest on the abdominopelvic CT subsequently obtained. Findings are suggestive of poor cardiac output/decreased cardiac function. 2. Marked cardiomegaly with multi chamber enlargement. Reflux of contrast into the hepatic veins consistent with elevated right heart pressures. 3. Moderate right and small to moderate left pleural effusions. Bilateral lower lobe partial consolidations, suspect for atelectasis. 4. Aortic atherosclerosis. Aortic Atherosclerosis (ICD10-I70.0). Electronically Signed   By: Jasmine Pang M.D.   On: 10/09/2023 22:32   DG Chest Port 1 View Result Date: 10/09/2023 CLINICAL DATA:  Intubated EXAM: PORTABLE CHEST 1 VIEW COMPARISON:  10/08/2013 FINDINGS: Endotracheal tube tip is about 4 cm superior to carina. Enteric tube tip below the diaphragm. Post sternotomy changes and valve prosthesis. Cardiomegaly with vascular congestion. Small pleural effusions. Dense left lung base airspace disease. No pneumothorax. Aortic atherosclerosis IMPRESSION: 1. Endotracheal tube tip about 4 cm superior to carina. Enteric tube tip below the diaphragm. 2. Cardiomegaly with vascular congestion and small pleural effusions. Dense left lung base  airspace disease which may be due to atelectasis or pneumonia. Electronically Signed   By: Jasmine Pang M.D.   On: 10/09/2023 21:18   DG Abd 1 View Result Date: 10/09/2023 CLINICAL DATA:  Orogastric tube placement. EXAM: ABDOMEN - 1 VIEW COMPARISON:  None Available. FINDINGS: Tip of the enteric tube is below the diaphragm in the stomach, the side port is just beyond the gastroesophageal junction. There is gaseous distention of bowel in the upper abdomen, likely transverse colon. IMPRESSION: Tip and side port of the enteric tube below the diaphragm in the stomach. Electronically Signed   By: Narda Rutherford M.D.   On: 10/09/2023 21:18   DG Chest Port 1 View Result Date: 10/09/2023 CLINICAL DATA:  Questionable sepsis EXAM: PORTABLE CHEST 1 VIEW COMPARISON:  Chest x-ray 08/04/2023 FINDINGS: Cardiac silhouette is enlarged. Prosthetic heart valve and sternotomy wires are again seen. There are patchy opacities in the left lung base which are new from prior. There are small bilateral pleural effusions. There is no pneumothorax or acute fracture. IMPRESSION: 1. Patchy opacities in the left lung base which are new from prior, concerning for pneumonia. 2. Small bilateral pleural effusions. Electronically Signed   By: Darliss Cheney M.D.   On: 10/09/2023 16:16     Medications:     Current Medications:  Chlorhexidine Gluconate Cloth  6 each Topical Daily   docusate  100 mg Per Tube BID   etomidate       famotidine  20 mg Per Tube Daily   insulin aspart  0-9 Units Subcutaneous Q4H   mouth rinse  15 mL Mouth Rinse Q2H   polyethylene glycol  17 g Per Tube Daily   rocuronium        Infusions:  amiodarone 60 mg/hr ( 0716)   epinephrine 10 mcg/min ( 0826)   fentaNYL infusion INTRAVENOUS 150 mcg/hr ( 0716)   milrinone 0.125 mcg/kg/min ( 0716)   norepinephrine (LEVOPHED) Adult infusion 24 mcg/min ( 0716)   piperacillin-tazobactam (ZOSYN)  IV Stopped ( 9528)    sodium bicarbonate 150 mEq in sterile water 1,150 mL infusion 125 mL/hr at  0716   vasopressin 0.04 Units/min ( 0716)    Patient Profile   Juan [name] is a 83 y.o. 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 Now admitted with mixed cardiogenic/septic shock 2/2 UTI.   Assessment/Plan  Mixed cardiogenic/septic shock - s/p brief cardiac arrest in ED, down ~3-4 mins - Lactic acid 3>7.4>6.4>4.5 - UA with persistent UTI, on Zosyn - WBC up to 20.9, hypothermic on arrival, now spiking mild fevers - Currently on amio @60 /hr, Epi @10 , milrinone @ 0.125, NE @ 26, vaso @ 0.04.   - Continue inotropic support, plan for wean later today  Acute on chronic systolic HF, iCM - Echo 11/24: EF 25-30%, LV with GHK, mild LVH, RV normal, LA severely dilated, RA mildly dilated, no MR, severe mitral annular calcification, mod TR, mod pulmonic valve regurgitation, mod dilated pulm artery - CVP 16, co-ox 69% - Volume overloaded, would diurese but will hold off for now per family wishes - No GDMT with shock - No escalation of care per family requests  Atrial fibrillation with RVR - HR in 140s - anticoagulation held with retroperitoneal hematoma - Continue amiodarone gtt @60 /hr - previously rate controlled with digoxin and BB (holding both)  UTI - Per primary - AMS on admission 2/2 UTI - Continue Zosyn  Acute respiratory failure - Intubated, management per PCCM  CAD  - LHC 3/16: occluded right coronary with an occluded saphenous vein graft to the right coronary filled by collaterals.  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 They wish for family to continue to visit with plans for possible comfort care later today.    Length of Stay: 1  Alen Bleacher, NP  , 8:29 AM  Advanced Heart Failure Team Pager [phone] (M-F; 7a - 5p)  Please contact [facility] for night-coverage after hours (4p -7a ) and weekends on [email or web address]   Agree with above.  83 y/o male with multiple medical problems including CAD, chronic systolic HF, PAF, bullous pemphigoid, OSA admitted with AMS and AF with RVR.   CT scan with small RP bleed.   Found to have UTI.   While in CT scanner had brief cardiac arrest with CPR. Intubated. Lactic acid elevated.   Now in ICU on 3 pressors with marginal BP. Labs with evidence of shock liver/kidney.   Echo 11/24 EF 25%  General:  Critically ill appearing. Intubated/sedated HEENT: + ETT Neck: supple. JVP up  Cor: Irreg tachy  Lungs: mechanical BS Abdomen: soft, nontender, nondistended. No hepatosplenomegaly. No bruits or masses. Good bowel sounds. Extremities: no cyanosis, clubbing, rash, edema Neuro:intubated/sedated  He is critically ill with mixed cardiogenic and septic shock and multi-system orgran failur.Marland Kitchen Co-ox now improved but requiring multiple pressors. Family aware of poor prognosis and has elected for comfort care measures which I agree with. Terminal extubation has occurred per CCM.   CCT 40 mins.  Arvilla Meres, MD  4:17 PM

## 2023-10-26 NOTE — Progress Notes (Signed)
Family informed RN they were ready to transition patient to CC. MD and NP notified by RN and new orders placed for patient comfort and safety. Family desired to step out of room during extubation. RT to bedside to extubate. RN at bedside to discontinue appropriate medications. Comfort medications remain administering to patient at this time. Family updated on what patient is currently receiving. All questions answered within RN scope. Family at bedside offering love and support to patient.

## 2023-10-26 NOTE — Progress Notes (Signed)
    0700  Spiritual Encounters  Type of Visit Follow up  Care provided to: Pt and family (Wife and 2 Daughters at bedside; Renato Gails expected)  Referral source Other (comment) (Chaplain Rounds)  Reason for visit Routine spiritual support  OnCall Visit Yes  Spiritual Framework  Presenting Themes Impactful experiences and emotions;Courage hope and growth  Interventions  Spiritual Care Interventions Made Established relationship of care and support;Compassionate presence;Reflective listening;Narrative/life review;Bereavement/grief support  Intervention Outcomes  Outcomes Connection to spiritual care;Connection to values and goals of care;Reduced anxiety;Connected to spiritual community;Other (comment);Awareness around self/spiritual resourses (Comfort)

## 2023-10-26 NOTE — Progress Notes (Addendum)
Patient time of death 1136. Pronounced by this RN and Arther Dames. Family at bedside. MD and NP notified and acknowledged notification. Funeral home form completed and placed in patient chart.

## 2023-10-26 NOTE — Progress Notes (Signed)
Pharmacy Antibiotic Note  Juan [name] is a 83 y.o. male admitted on 10/09/2023 with  aspiration PNA .  Pharmacy has been consulted for Zosyn dosing.  Plan: Zosyn 3.375g IV q8h (4 hour infusion).  Height: 6' (182.9 cm) Weight: 69.8 kg (153 lb 14.1 oz) IBW/kg (Calculated) : 77.6  Temp (24hrs), Avg:97.7 F (36.5 C), Min:96.7 F (35.9 C), Max:98.3 F (36.8 C)  Recent Labs  Lab 10/09/23 1415 10/09/23 1604 10/09/23 2120 10/09/23 2214  WBC 8.6  --  9.5  --   CREATININE 1.58*  --  1.64*  --   LATICACIDVEN 3.0* 3.2* 7.1* 7.4*    Estimated Creatinine Clearance: 33.7 mL/min (A) (by C-G formula based on SCr of 1.64 mg/dL (H)).    Allergies  Allergen Reactions   Prednisone Other (See Comments)    Developed afib after use.    Antimicrobials this admission:   >>    >>   Dose adjustments this admission:   Microbiology results:  BCx:   UCx:    Sputum:    MRSA PCR:   Thank you for allowing pharmacy to be a part of this patient's care.  Juleon Narang D  12:48 AM

## 2023-10-26 NOTE — Progress Notes (Signed)
   10/09/23 2327  Spiritual Encounters  Type of Visit Follow up  Care provided to: Pt and family (Wife, daughter and Renato Gails at beside)  Psychologist, sport and exercise partners present during Programmer, systems  Referral source Family  Reason for visit Urgent spiritual support  OnCall Visit Yes  Spiritual Framework  Presenting Themes Meaning/purpose/sources of inspiration;Impactful experiences and emotions  Family Stress Factors Exhausted  Goals  Additional Comment(s) Renato Gails came to ICU  Interventions  Spiritual Care Interventions Made Compassionate presence

## 2023-10-26 NOTE — Progress Notes (Signed)
PHARMACY CONSULT NOTE - ELECTROLYTES  Pharmacy Consult for Electrolyte Monitoring and Replacement   Recent Labs: Height: 6' (182.9 cm) Weight: 69.8 kg (153 lb 14.1 oz) IBW/kg (Calculated) : 77.6 Estimated Creatinine Clearance: 28.9 mL/min (A) (by C-G formula based on SCr of 1.91 mg/dL (H)). Potassium (mmol/L)  Date Value   4.2   Magnesium (mg/dL)  Date Value  16/06/9603 2.5 (H)   Calcium (mg/dL)  Date Value  54/05/8118 8.1 (L)   Albumin (g/dL)  Date Value  14/78/2956 3.0 (L)   Phosphorus (mg/dL)  Date Value  21/30/8657 7.1 (H)   Sodium (mmol/L)  Date Value   131 (L)   Assessment  Juan [name] is a 83 y.o. male presenting with weakness after outpatient diagnosis of UTI on 10/08/23. 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 Pharmacy has been consulted to monitor and replace electrolytes.  Diet: NPO MIVF: N/A Pertinent medications: None  Goal of Therapy: Electrolytes WNL  Plan:  No replacement currently indicated Check BMP, Mg, Phos with AM labs  Thank you for allowing pharmacy to be a part of this patient's care.  Bettey Costa, PharmD Clinical Pharmacist  7:40 AM

## 2023-10-26 NOTE — Plan of Care (Signed)

## 2023-10-26 NOTE — Consult Note (Signed)
[facility] CLINIC CARDIOLOGY CONSULT NOTE       [name] MRN: [id number] DOB/AGE: [birth year] 83 y.o.  Admit date: 10/09/2023 Referring Physician Julious Payer, NP Primary Physician Marguarite Arbour, MD  Primary Cardiologist Dr. Juliann Pares Reason for Consultation cardiogenic shock  HPI: Juan [name] is a 83 y.o. male  with a past medical history of  ischemic cardiomyopathy (EF 20 to 30%), paroxysmal atrial fibrillation on Eliquis, mitral regurgitation s/p mitral valve repair, CAD s/p CABG x1 in 2001 (SVG to RPDA), hypertension, hyperlipidemia, OSA  who presented to the ED on 10/09/2023 for generalized weakness. Cardiology was consulted for further evaluation.   Patient's wife states that he was seen by Dr. Juliann Pares in the office a few days ago and was diagnosed with atrial fibrillation.  Earlier this week was having complaints of shortness of breath which was felt to be related to new onset A-fib.  He also saw his PCP the following day and was diagnosed with a UTI and started on antibiotics.  His wife reports that yesterday he seemed extremely weak and fatigued as well as confused, hardly able to get out of bed.  EMS was called and he was found to be significantly hypotensive.  He was brought to the ED for further evaluation.  Workup in the ED notable for creatinine 1.58, potassium 6.1, sodium 124, AST 207, ALT 165, hemoglobin 12.5, WBC 8.6.  Lactic acid elevated at 3.0.  He was started on IV antibiotics in the ED for urinary tract infection.  Around 8:30 PM the patient was found to be less responsive and had a faint pulse, shortly after this had a brief arrest and received ACLS for roughly 3 minutes.  Reportedly had a wide-complex tachycardia but EKGs reviewed and did not demonstrate any ventricular arrhythmias. He was then intubated and brought to the ICU. Co-ox checked and there is concern for cardiogenic shock in addition to septic shock.  At the time of my  evaluation this morning patient is intubated and sedated in the ICU with multiple family members at bedside.  We discussed his current illness in further detail.  Family expressed understanding of his critical status at this time.  His wife reports that over the last few weeks he has not complained of any chest pain symptoms.  States that he has had some shortness of breath which was felt to be related to the new onset A-fib.  She denies any recent fever/chills.  He is currently on milrinone, vasopressin, Levophed, epinephrine as well as IV amiodarone.  Review of systems complete and found to be negative unless listed above    Past Medical History:  Diagnosis Date   Arthritis    back- low   Atrial fibrillation (HCC)    Cancer (HCC)    skin ( basal) cell , face, head, back, inside L ear   CHF (congestive heart failure) (HCC)    Coronary artery disease    Dysrhythmia    MI (myocardial infarction) (HCC)    Sleep apnea    +Cpap in use nightly , last study- 10 yrs. 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"    Past Surgical History:  Procedure Laterality Date   APPENDECTOMY     BACK SURGERY  09/2011   lumbar fusion    CARDIAC CATHETERIZATION     CARDIAC SURGERY     CARDIAC VALVE REPLACEMENT     CORONARY ARTERY BYPASS GRAFT     EYE SURGERY Bilateral  catarascts removed-    HERNIA REPAIR Bilateral 1946, 1990's   inguinal x3 procedures    LUMBAR LAMINECTOMY/DECOMPRESSION MICRODISCECTOMY Left 08/14/2019   Procedure: Laminectomy for facet/synovial cyst - left - Lumbar three-Lumbar four;  Surgeon: Julio Sicks, MD;  Location: [facility] OR;  Service: Neurosurgery;  Laterality: Left;   TONSILLECTOMY      Medications Prior to Admission  Medication Sig Dispense Refill Last Dose/Taking   apixaban (ELIQUIS) 5 MG TABS tablet Take 5 mg by mouth 2 (two) times daily.   10/08/2023   B Complex-C-E-Zn (B COMPLEX-C-E-ZINC) tablet Take 1 tablet by mouth daily.   10/08/2023   cholecalciferol (VITAMIN D) 1000 units  tablet Take 2,000 Units by mouth daily.   10/08/2023   doxycycline (VIBRAMYCIN) 100 MG capsule Take 100 mg by mouth at bedtime.   2 10/08/2023   ezetimibe (ZETIA) 10 MG tablet Take 10 mg by mouth at bedtime.    10/08/2023   folic acid (FOLVITE) 1 MG tablet Take 5 mg by mouth daily.   Taking   losartan (COZAAR) 25 MG tablet Take 25 mg by mouth daily.   10/08/2023   MAGNESIUM GLUCONATE PO Take 350 mg by mouth daily. Take 115mg  tablet daily.   Taking   metoprolol tartrate (LOPRESSOR) 25 MG tablet Take 1 tablet (25 mg total) by mouth 2 (two) times daily. 30 tablet 11 10/08/2023   metoprolol tartrate (LOPRESSOR) 50 MG tablet Take 50 mg by mouth 2 (two) times daily.   10/08/2023   polyethylene glycol (MIRALAX / GLYCOLAX) 17 g packet Take 17 g by mouth daily. 14 each 0 10/07/2023   Potassium 99 MG TABS Take 99 mg by mouth daily.   10/08/2023   selenium sulfide (SELSUN) 2.5 % shampoo Apply 1 application topically daily.   Past Week   silodosin (RAPAFLO) 8 MG CAPS capsule Take 1 capsule (8 mg total) by mouth daily with breakfast. 30 capsule 2 10/08/2023   tamsulosin (FLOMAX) 0.4 MG CAPS capsule Take 0.4 mg by mouth.   10/08/2023   vitamin E 1000 UNIT capsule Take 1,000 Units by mouth daily. Lunch   10/08/2023   bisacodyl (DULCOLAX) 5 MG EC tablet Take 2 tablets (10 mg total) by mouth at bedtime. 30 tablet 0 10/08/2023   colesevelam (WELCHOL) 625 MG tablet Take 1,875 mg by mouth 2 (two) times daily.  (Patient not taking: Reported on )   Not Taking   methotrexate (RHEUMATREX) 2.5 MG tablet Take 10 mg by mouth once a week. (Patient not taking: Reported on )   Not Taking   Social History   Socioeconomic History   Marital status: Married    Spouse name: Not on file   Number of children: Not on file   Years of education: Not on file   Highest education level: Not on file  Occupational History   Not on file  Tobacco Use   Smoking status: Never    Passive exposure: Never   Smokeless tobacco:  Never  Vaping Use   Vaping status: Never Used  Substance and Sexual Activity   Alcohol use: No   Drug use: No   Sexual activity: Not on file  Other Topics Concern   Not on file  Social History Narrative   Not on file   Social Drivers of Health   Financial Resource Strain: Low Risk  (08/14/2023)   Received from [facility]   Overall Financial Resource Strain (CARDIA)    Difficulty of Paying Living Expenses: Not hard at  all  Food Insecurity: Patient Unable To Answer ()   Hunger Vital Sign    Worried About Running Out of Food in the Last Year: Patient unable to answer    Ran Out of Food in the Last Year: Patient unable to answer  Transportation Needs: Patient Unable To Answer ()   PRAPARE - Transportation    Lack of Transportation (Medical): Patient unable to answer    Lack of Transportation (Non-Medical): Patient unable to answer  Physical Activity: Not on file  Stress: Not on file  Social Connections: Patient Unable To Answer ()   Social Connection and Isolation Panel [NHANES]    Frequency of Communication with Friends and Family: Patient unable to answer    Frequency of Social Gatherings with Friends and Family: Patient unable to answer    Attends Religious Services: Patient unable to answer    Active Member of Clubs or Organizations: Patient unable to answer    Attends Banker Meetings: Patient unable to answer    Marital Status: Patient unable to answer  Intimate Partner Violence: Patient Unable To Answer ()   Humiliation, Afraid, Rape, and Kick questionnaire    Fear of Current or Ex-Partner: Patient unable to answer    Emotionally Abused: Patient unable to answer    Physically Abused: Patient unable to answer    Sexually Abused: Patient unable to answer    History reviewed. No pertinent family history.   Vitals:    0700  0715  0730  0800  BP: (!) 81/57 99/63  (!) 98/57   Pulse:      Resp: 17 18 16 17   Temp: 98.8 F (37.1 C) 99 F (37.2 C) 99.1 F (37.3 C) 99.5 F (37.5 C)  TempSrc:    Esophageal  SpO2: 100%   100%  Weight:      Height:        PHYSICAL EXAM General: Critically ill elderly male, well nourished, in no acute distress. HEENT: Normocephalic and atraumatic. Neck: No JVD.  Lungs: Intubated, mechanical breath sounds.  Heart: Irregularly irregular, fast rate. Normal S1 and S2 without gallops or murmurs.  Abdomen: Non-distended appearing.  Msk: Normal strength and tone for age. Extremities: Warm and well perfused. No clubbing, cyanosis.  No significant edema.  Neuro: Alert and oriented X 3. Psych: Answers questions appropriately.   Labs: Basic Metabolic Panel: Recent Labs    10/09/23 2120  0106  0520  NA 127* 127* 131*  K 4.8 4.4 4.2  CL 100 98 95*  CO2 13* 15* 19*  GLUCOSE 209* 256* 191*  BUN 40* 41* 44*  CREATININE 1.64* 1.80* 1.91*  CALCIUM 9.3 8.2* 8.1*  MG 2.5*  --   --   PHOS 6.6*  --  7.1*   Liver Function Tests: Recent Labs    10/09/23 2120  0520  AST 254* 1,010*  ALT 220* 827*  ALKPHOS 50 62  BILITOT 0.9 0.8  PROT 5.3* 5.2*  ALBUMIN 3.0* 3.0*   No results for input(s): "LIPASE", "AMYLASE" in the last 72 hours. CBC: Recent Labs    10/09/23 1415 10/09/23 2120 10/09/23 2344  0520  WBC 8.6 9.5 10.7* 20.9*  NEUTROABS 7.6 7.6  --   --   HGB 12.5* 11.7* 11.6* 11.2*  HCT 37.7* 37.8* 35.8* 34.1*  MCV 99.5 107.4* 105.0* 102.1*  PLT 143* 129* 131* 141*   Cardiac Enzymes: Recent Labs    10/09/23 2120  0106  0520  TROPONINIHS 84* 134* 233*  BNP: Recent Labs    10/09/23 2120  BNP 2,892.2*   D-Dimer: Recent Labs     0015  DDIMER 4.64*   Hemoglobin A1C: No results for input(s): "HGBA1C" in the last 72 hours. Fasting Lipid Panel: Recent Labs     0520  TRIG 86   Thyroid Function Tests: No results for input(s): "TSH",  "T4TOTAL", "T3FREE", "THYROIDAB" in the last 72 hours.  Invalid input(s): "FREET3" Anemia Panel: No results for input(s): "VITAMINB12", "FOLATE", "FERRITIN", "TIBC", "IRON", "RETICCTPCT" in the last 72 hours.   Radiology: [facility] Chest Port 1 View Result Date: 10/09/2023 CLINICAL DATA:  Central line placement. EXAM: PORTABLE CHEST 1 VIEW COMPARISON:  Earlier today however radiograph and CT FINDINGS: Tip of the right internal jugular central venous catheter overlies the lower SVC. No pneumothorax. Endotracheal tube tip at the level of the thoracic inlet. Enteric tube side port below the diaphragm. Stable cardiomegaly, prosthetic cardiac valve. Increasing retrocardiac opacity. There are hazy bilateral lung opacities correspond to layering effusions. IMPRESSION: 1. Right internal jugular central venous catheter tip overlies the lower SVC. No pneumothorax. 2. Increasing retrocardiac opacity. Hazy bilateral lung opacities representing layering effusions. Electronically Signed   By: Narda Rutherford M.D.   On: 10/09/2023 23:51   CT ABDOMEN PELVIS W CONTRAST Result Date: 10/09/2023 CLINICAL DATA:  Sepsis weakness EXAM: CT ABDOMEN AND PELVIS WITH CONTRAST TECHNIQUE: Multidetector CT imaging of the abdomen and pelvis was performed using the standard protocol following bolus administration of intravenous contrast. RADIATION DOSE REDUCTION: This exam was performed according to the departmental dose-optimization program which includes automated exposure control, adjustment of the mA and/or kV according to patient size and/or use of iterative reconstruction technique. CONTRAST:  75mL OMNIPAQUE IOHEXOL 350 MG/ML SOLN COMPARISON:  None Available. FINDINGS: Lower chest: Moderate right and small left pleural effusions. Partial lower lobe consolidations probably due to atelectasis. Coronary vascular calcification and cardiomegaly. Hepatobiliary: No calcified gallstone. Periportal edema. Mild luminal hyperdensity at the  gallbladder probably sludge. Suspect marked gallbladder wall thickening. Pancreas: Unremarkable. No pancreatic ductal dilatation or surrounding inflammatory changes. Spleen: Normal in size without focal abnormality. Adrenals/Urinary Tract: Adrenal glands are within normal limits. Kidneys show no hydronephrosis. Multiple right renal cysts for which no imaging follow-up is recommended. Nonspecific perinephric stranding. Catheter in the bladder. Bladder is decompressed and diffusely thick walled. Stomach/Bowel: Enteric tube tip at the GE junction. Fluid-filled mid to distal small bowel without well demonstrated transition point. No acute bowel wall thickening. Moderate colonic distension. Poorly identified appendix Vascular/Lymphatic: Advanced aortic atherosclerosis. No aneurysm. No suspicious lymph nodes Reproductive: Prostate slightly enlarged Other: Negative for free air. Generalized subcutaneous edema consistent with anasarca. Generalized mesenteric edema. Small volume free fluid in the upper abdomen. Slightly hyperdense heterogenous fluid in the right Peri renal space and retroperitoneum extending along the right psoas suspect for a hematoma, with slightly more dense area along the posterior aspect of the inferior right kidney. No convincing extravasation on this exam. Evidence of prior ventral hernia repair. Small fat containing right inguinal hernia Musculoskeletal: Left posterior lumbar fusion hardware and interbody device at L4-L5. No acute osseous abnormality IMPRESSION: 1. Slightly dense small moderate heterogeneous collection within the right perinephric space and retroperitoneum, suspicious for retroperitoneal hematoma. No convincing acute extravasation on this exam. 2. Moderate right and small left pleural effusions with partial lower lobe consolidations probably due to atelectasis. Cardiomegaly with generalized mesenteric edema, anasarca, and small volume ascites. 3. Enteric tube tip at the GE junction,  recommend advancement 4. Fluid-filled mid to  distal small bowel without well demonstrated transition point, possible ileus. 5. Diffusely thick-walled urinary bladder decompressed by Foley catheter, correlate for cystitis. Aortic Atherosclerosis (ICD10-I70.0). Electronically Signed   By: Jasmine Pang M.D.   On: 10/09/2023 22:48   CT HEAD WO CONTRAST ( ) Result Date: 10/09/2023 CLINICAL DATA:  Mental status change EXAM: CT HEAD WITHOUT CONTRAST TECHNIQUE: Contiguous axial images were obtained from the base of the skull through the vertex without intravenous contrast. RADIATION DOSE REDUCTION: This exam was performed according to the departmental dose-optimization program which includes automated exposure control, adjustment of the mA and/or kV according to patient size and/or use of iterative reconstruction technique. COMPARISON:  08/04/2023 FINDINGS: Brain: No acute territorial infarction, hemorrhage or intracranial mass. Chronic left cerebellar and occipital infarcts. Interval small right posterior frontal infarct, which also appears chronic but is new compared with November 2024 CT. Atrophy and chronic small vessel ischemic changes of the white matter. Stable ventricle size. Vascular: No hyperdense vessels.  Carotid vascular calcification Skull: Normal. Negative for fracture or focal lesion. Sinuses/Orbits: No acute finding. Other: None IMPRESSION: 1. No CT evidence for acute intracranial abnormality. 2. Atrophy and chronic small vessel ischemic changes of the white matter. Chronic left cerebellar and occipital infarcts. Interval small right posterior frontal infarct, which also appears chronic but is new compared with November 2024 CT. Electronically Signed   By: Jasmine Pang M.D.   On: 10/09/2023 22:36   CT Angio Chest Pulmonary Embolism (PE) W or WO Contrast Result Date: 10/09/2023 CLINICAL DATA:  UTI mental status change weakness EXAM: CT ANGIOGRAPHY CHEST WITH CONTRAST TECHNIQUE: Multidetector CT  imaging of the chest was performed using the standard protocol during bolus administration of intravenous contrast. Multiplanar CT image reconstructions and MIPs were obtained to evaluate the vascular anatomy. RADIATION DOSE REDUCTION: This exam was performed according to the departmental dose-optimization program which includes automated exposure control, adjustment of the mA and/or kV according to patient size and/or use of iterative reconstruction technique. CONTRAST:  75mL OMNIPAQUE IOHEXOL 350 MG/ML SOLN COMPARISON:  Chest x-ray 10/09/2023 FINDINGS: Cardiovascular: Moderate aortic atherosclerosis. No aneurysm. Post CABG changes. Heterogeneous opacification of the main pulmonary arteries suspected to be secondary to mixing artifact with similar appearing hypodensities extending into the descending bilateral lobar arteries. Essentially non opacifications of the lower lobe segmental and subsegmental vessels and non opacification of the distal right lower lobe pulmonary arteries. Some opacification noted on delayed views of the included lower chest on the abdomen exam. Coronary vascular calcification. Marked cardiomegaly with multi chamber enlargement. Reflux of contrast into the hepatic veins consistent with elevated right heart pressures. Mitral valve prosthesis. Mediastinum/Nodes: Endotracheal tube tip couple cm superior to the carina. Esophageal tube tip at the GE junction. No suspicious lymph nodes. Lungs/Pleura: Moderate right and small moderate left pleural effusion. Bilateral lower lobe partial consolidations. No pneumothorax. Upper Abdomen: See separately dictated CT Musculoskeletal: Post sternotomy changes. No acute osseous abnormality. Review of the MIP images confirms the above findings. IMPRESSION: 1. Very limited study for assessment of pulmonary embolus due to heterogeneous opacification of the main pulmonary arteries suspected to be secondary to mixing artifact with similar appearing hypodensities  extending into the descending bilateral lobar arteries as well as right lower lobe segmental and subsegmental vessels; essentially non opacification of the lower lobe segmental and subsegmental vessels and non opacification of the distal right lower lobe pulmonary artery, thereby limiting assessment for pulmonary embolus. There is however some opacifications of lower lobe pulmonary vessels on the included portions  of lower chest on the abdominopelvic CT subsequently obtained. Findings are suggestive of poor cardiac output/decreased cardiac function. 2. Marked cardiomegaly with multi chamber enlargement. Reflux of contrast into the hepatic veins consistent with elevated right heart pressures. 3. Moderate right and small to moderate left pleural effusions. Bilateral lower lobe partial consolidations, suspect for atelectasis. 4. Aortic atherosclerosis. Aortic Atherosclerosis (ICD10-I70.0). Electronically Signed   By: Jasmine Pang M.D.   On: 10/09/2023 22:32   DG Chest Port 1 View Result Date: 10/09/2023 CLINICAL DATA:  Intubated EXAM: PORTABLE CHEST 1 VIEW COMPARISON:  10/08/2013 FINDINGS: Endotracheal tube tip is about 4 cm superior to carina. Enteric tube tip below the diaphragm. Post sternotomy changes and valve prosthesis. Cardiomegaly with vascular congestion. Small pleural effusions. Dense left lung base airspace disease. No pneumothorax. Aortic atherosclerosis IMPRESSION: 1. Endotracheal tube tip about 4 cm superior to carina. Enteric tube tip below the diaphragm. 2. Cardiomegaly with vascular congestion and small pleural effusions. Dense left lung base airspace disease which may be due to atelectasis or pneumonia. Electronically Signed   By: Jasmine Pang M.D.   On: 10/09/2023 21:18   DG Abd 1 View Result Date: 10/09/2023 CLINICAL DATA:  Orogastric tube placement. EXAM: ABDOMEN - 1 VIEW COMPARISON:  None Available. FINDINGS: Tip of the enteric tube is below the diaphragm in the stomach, the side port is  just beyond the gastroesophageal junction. There is gaseous distention of bowel in the upper abdomen, likely transverse colon. IMPRESSION: Tip and side port of the enteric tube below the diaphragm in the stomach. Electronically Signed   By: Narda Rutherford M.D.   On: 10/09/2023 21:18   DG Chest Port 1 View Result Date: 10/09/2023 CLINICAL DATA:  Questionable sepsis EXAM: PORTABLE CHEST 1 VIEW COMPARISON:  Chest x-ray 08/04/2023 FINDINGS: Cardiac silhouette is enlarged. Prosthetic heart valve and sternotomy wires are again seen. There are patchy opacities in the left lung base which are new from prior. There are small bilateral pleural effusions. There is no pneumothorax or acute fracture. IMPRESSION: 1. Patchy opacities in the left lung base which are new from prior, concerning for pneumonia. 2. Small bilateral pleural effusions. Electronically Signed   By: Darliss Cheney M.D.   On: 10/09/2023 16:16    ECHO pending  TELEMETRY reviewed by me : Atrial fibrillation rate 130s  EKG reviewed by me: Atrial fibrillation rate 140 bpm  Data reviewed by me : last 24h vitals tele labs imaging I/O ED provider note, admission H&P  Principal Problem:   Sepsis (HCC) Active Problems:   Cardiac arrest (HCC)   Cardiogenic shock (HCC)    ASSESSMENT AND PLAN:  Juan [name] is a 83 y.o. male  with a past medical history of  ischemic cardiomyopathy (EF 20 to 30%), paroxysmal atrial fibrillation on Eliquis, mitral regurgitation s/p mitral valve repair, CAD s/p CABG x1 in 2001 (SVG to RPDA), hypertension, hyperlipidemia, OSA  who presented to the ED on 10/09/2023 for generalized weakness. Cardiology was consulted for further evaluation.   # Mixed cardiogenic and septic shock # Brief cardiac arrest # Acute on chronic HFrEF # Atrial fibrillation RVR Patient with a history of chronic HFrEF, paroxysmal atrial fibrillation presented to the ED for confusion, generalized weakness.  Initially  there was concern for septic shock from UTI, patient had a brief cardiac arrest in the ED at which time he was intubated and brought to the ICU.  Lactic acid initially 3.0 up to 7.4 overnight.  BNP found to be elevated at  nearly 2900.  Concern for mixed cardiogenic and septic shock. -Echo ordered. -Remains on milrinone, Levophed, epinephrine, vasopressin.  Wean as tolerated as per PCCM. -Continue amiodarone infusion. -Patient received 1 dose of IV Lasix late yesterday. -Advanced heart failure following, appreciate recommendations. -Suspect mild troponin elevation most consistent with demand/supply mismatch and not ACS in the setting of critical illness. -No escalation of care as per family wishes.   This patient's plan of care was discussed and created with Dr. Corky Sing and he is in agreement.  Signed: Gale Journey, PA-C  , 10:13 AM [facility]

## 2023-10-26 NOTE — Death Summary Note (Signed)
DEATH SUMMARY   Patient Details  Name: Juan [name] MRN: [id number] DOB: Nov 07, 1940  Admission/Discharge Information   Admit Date:  11/02/23  Date of Death: Date of Death: 03-Nov-2023  Time of Death: Time of Death: November 05, 1134  Length of Stay: 1  Referring Physician: Marguarite Arbour, MD   Reason(s) for Hospitalization  Mixed Cardiogenic - Septic shock   Diagnoses  Preliminary cause of death:  Secondary Diagnoses (including complications and co-morbidities):  Principal Problem:   Sepsis [facility]) Active Problems:   Cardiac arrest [facility])   Cardiogenic shock [facility])   Brief Hospital Course (including significant findings, care, treatment, and services provided and events leading to death)  Juan [name] is a 83 y.o. year old male who Case of an 83 year old male patient with a past medical history of CAD status post CABG complicated by ischemic cardiomyopathy with LVEF of 25 to 30%.  Mitral valve prolapse status postrepair.  OSA on CPAP.  Chronic A-fib on Eliquis.  Hypertension and hiatal hernia.  Presenting to [facility] on 2023/11/02 weakness fatigue and hypotension.  He was found to be in profound septic shock complicated by cardiogenic shock.  Required intubation mechanical ventilation in the emergency department due to ongoing respiratory distress which also was complicated by Marina Goodell intubation cardiac arrest requiring 1 round of CPR to ROSC.   CT head without contrast with no evidence of acute intracranial abnormality   CT angio chest PE 2023-11-02 no signs of pulmonary embolism in the main pulmonary arteries.   CT abdomen pelvis 11/02/23 question for retroperitoneal hematoma.  Anasarca.  Given ongoing worsening shock requiring triple pressors, ischemic cardiomyopathy and multiple other comorbidities I met with the family has 2 daughters, his wife who relayed to me how important his quality of life was for Juan [name].  We had discussed extensively the poor  prognosis and the high mortality rate.  At this point they decided to proceed with comfort measures only.  Mr. Loss passed comfortably on 2023-11-03 at 11:36 AM  Pertinent Labs and Studies  Significant Diagnostic Studies DG Chest Port 1 View Result Date: 2023/11/02 CLINICAL DATA:  Central line placement. EXAM: PORTABLE CHEST 1 VIEW COMPARISON:  Earlier today however radiograph and CT FINDINGS: Tip of the right internal jugular central venous catheter overlies the lower SVC. No pneumothorax. Endotracheal tube tip at the level of the thoracic inlet. Enteric tube side port below the diaphragm. Stable cardiomegaly, prosthetic cardiac valve. Increasing retrocardiac opacity. There are hazy bilateral lung opacities correspond to layering effusions. IMPRESSION: 1. Right internal jugular central venous catheter tip overlies the lower SVC. No pneumothorax. 2. Increasing retrocardiac opacity. Hazy bilateral lung opacities representing layering effusions. Electronically Signed   By: Narda Rutherford M.D.   On: 11/02/23 23:51   CT ABDOMEN PELVIS W CONTRAST Result Date: 2023/11/02 CLINICAL DATA:  Sepsis weakness EXAM: CT ABDOMEN AND PELVIS WITH CONTRAST TECHNIQUE: Multidetector CT imaging of the abdomen and pelvis was performed using the standard protocol following bolus administration of intravenous contrast. RADIATION DOSE REDUCTION: This exam was performed according to the departmental dose-optimization program which includes automated exposure control, adjustment of the mA and/or kV according to patient size and/or use of iterative reconstruction technique. CONTRAST:  75mL OMNIPAQUE IOHEXOL 350 MG/ML SOLN COMPARISON:  None Available. FINDINGS: Lower chest: Moderate right and small left pleural effusions. Partial lower lobe consolidations probably due to atelectasis. Coronary vascular calcification and cardiomegaly. Hepatobiliary: No calcified gallstone. Periportal edema. Mild luminal hyperdensity at the gallbladder  probably sludge.  Suspect marked gallbladder wall thickening. Pancreas: Unremarkable. No pancreatic ductal dilatation or surrounding inflammatory changes. Spleen: Normal in size without focal abnormality. Adrenals/Urinary Tract: Adrenal glands are within normal limits. Kidneys show no hydronephrosis. Multiple right renal cysts for which no imaging follow-up is recommended. Nonspecific perinephric stranding. Catheter in the bladder. Bladder is decompressed and diffusely thick walled. Stomach/Bowel: Enteric tube tip at the GE junction. Fluid-filled mid to distal small bowel without well demonstrated transition point. No acute bowel wall thickening. Moderate colonic distension. Poorly identified appendix Vascular/Lymphatic: Advanced aortic atherosclerosis. No aneurysm. No suspicious lymph nodes Reproductive: Prostate slightly enlarged Other: Negative for free air. Generalized subcutaneous edema consistent with anasarca. Generalized mesenteric edema. Small volume free fluid in the upper abdomen. Slightly hyperdense heterogenous fluid in the right Peri renal space and retroperitoneum extending along the right psoas suspect for a hematoma, with slightly more dense area along the posterior aspect of the inferior right kidney. No convincing extravasation on this exam. Evidence of prior ventral hernia repair. Small fat containing right inguinal hernia Musculoskeletal: Left posterior lumbar fusion hardware and interbody device at L4-L5. No acute osseous abnormality IMPRESSION: 1. Slightly dense small moderate heterogeneous collection within the right perinephric space and retroperitoneum, suspicious for retroperitoneal hematoma. No convincing acute extravasation on this exam. 2. Moderate right and small left pleural effusions with partial lower lobe consolidations probably due to atelectasis. Cardiomegaly with generalized mesenteric edema, anasarca, and small volume ascites. 3. Enteric tube tip at the GE junction, recommend  advancement 4. Fluid-filled mid to distal small bowel without well demonstrated transition point, possible ileus. 5. Diffusely thick-walled urinary bladder decompressed by Foley catheter, correlate for cystitis. Aortic Atherosclerosis (ICD10-I70.0). Electronically Signed   By: Jasmine Pang M.D.   On: 10/09/2023 22:48   CT HEAD WO CONTRAST ( ) Result Date: 10/09/2023 CLINICAL DATA:  Mental status change EXAM: CT HEAD WITHOUT CONTRAST TECHNIQUE: Contiguous axial images were obtained from the base of the skull through the vertex without intravenous contrast. RADIATION DOSE REDUCTION: This exam was performed according to the departmental dose-optimization program which includes automated exposure control, adjustment of the mA and/or kV according to patient size and/or use of iterative reconstruction technique. COMPARISON:  08/04/2023 FINDINGS: Brain: No acute territorial infarction, hemorrhage or intracranial mass. Chronic left cerebellar and occipital infarcts. Interval small right posterior frontal infarct, which also appears chronic but is new compared with November 2024 CT. Atrophy and chronic small vessel ischemic changes of the white matter. Stable ventricle size. Vascular: No hyperdense vessels.  Carotid vascular calcification Skull: Normal. Negative for fracture or focal lesion. Sinuses/Orbits: No acute finding. Other: None IMPRESSION: 1. No CT evidence for acute intracranial abnormality. 2. Atrophy and chronic small vessel ischemic changes of the white matter. Chronic left cerebellar and occipital infarcts. Interval small right posterior frontal infarct, which also appears chronic but is new compared with November 2024 CT. Electronically Signed   By: Jasmine Pang M.D.   On: 10/09/2023 22:36   CT Angio Chest Pulmonary Embolism (PE) W or WO Contrast Result Date: 10/09/2023 CLINICAL DATA:  UTI mental status change weakness EXAM: CT ANGIOGRAPHY CHEST WITH CONTRAST TECHNIQUE: Multidetector CT imaging of  the chest was performed using the standard protocol during bolus administration of intravenous contrast. Multiplanar CT image reconstructions and MIPs were obtained to evaluate the vascular anatomy. RADIATION DOSE REDUCTION: This exam was performed according to the departmental dose-optimization program which includes automated exposure control, adjustment of the mA and/or kV according to patient size and/or use of iterative reconstruction  technique. CONTRAST:  75mL OMNIPAQUE IOHEXOL 350 MG/ML SOLN COMPARISON:  Chest x-ray 10/09/2023 FINDINGS: Cardiovascular: Moderate aortic atherosclerosis. No aneurysm. Post CABG changes. Heterogeneous opacification of the main pulmonary arteries suspected to be secondary to mixing artifact with similar appearing hypodensities extending into the descending bilateral lobar arteries. Essentially non opacifications of the lower lobe segmental and subsegmental vessels and non opacification of the distal right lower lobe pulmonary arteries. Some opacification noted on delayed views of the included lower chest on the abdomen exam. Coronary vascular calcification. Marked cardiomegaly with multi chamber enlargement. Reflux of contrast into the hepatic veins consistent with elevated right heart pressures. Mitral valve prosthesis. Mediastinum/Nodes: Endotracheal tube tip couple cm superior to the carina. Esophageal tube tip at the GE junction. No suspicious lymph nodes. Lungs/Pleura: Moderate right and small moderate left pleural effusion. Bilateral lower lobe partial consolidations. No pneumothorax. Upper Abdomen: See separately dictated CT Musculoskeletal: Post sternotomy changes. No acute osseous abnormality. Review of the MIP images confirms the above findings. IMPRESSION: 1. Very limited study for assessment of pulmonary embolus due to heterogeneous opacification of the main pulmonary arteries suspected to be secondary to mixing artifact with similar appearing hypodensities extending  into the descending bilateral lobar arteries as well as right lower lobe segmental and subsegmental vessels; essentially non opacification of the lower lobe segmental and subsegmental vessels and non opacification of the distal right lower lobe pulmonary artery, thereby limiting assessment for pulmonary embolus. There is however some opacifications of lower lobe pulmonary vessels on the included portions of lower chest on the abdominopelvic CT subsequently obtained. Findings are suggestive of poor cardiac output/decreased cardiac function. 2. Marked cardiomegaly with multi chamber enlargement. Reflux of contrast into the hepatic veins consistent with elevated right heart pressures. 3. Moderate right and small to moderate left pleural effusions. Bilateral lower lobe partial consolidations, suspect for atelectasis. 4. Aortic atherosclerosis. Aortic Atherosclerosis (ICD10-I70.0). Electronically Signed   By: Jasmine Pang M.D.   On: 10/09/2023 22:32   DG Chest Port 1 View Result Date: 10/09/2023 CLINICAL DATA:  Intubated EXAM: PORTABLE CHEST 1 VIEW COMPARISON:  10/08/2013 FINDINGS: Endotracheal tube tip is about 4 cm superior to carina. Enteric tube tip below the diaphragm. Post sternotomy changes and valve prosthesis. Cardiomegaly with vascular congestion. Small pleural effusions. Dense left lung base airspace disease. No pneumothorax. Aortic atherosclerosis IMPRESSION: 1. Endotracheal tube tip about 4 cm superior to carina. Enteric tube tip below the diaphragm. 2. Cardiomegaly with vascular congestion and small pleural effusions. Dense left lung base airspace disease which may be due to atelectasis or pneumonia. Electronically Signed   By: Jasmine Pang M.D.   On: 10/09/2023 21:18   DG Abd 1 View Result Date: 10/09/2023 CLINICAL DATA:  Orogastric tube placement. EXAM: ABDOMEN - 1 VIEW COMPARISON:  None Available. FINDINGS: Tip of the enteric tube is below the diaphragm in the stomach, the side port is just  beyond the gastroesophageal junction. There is gaseous distention of bowel in the upper abdomen, likely transverse colon. IMPRESSION: Tip and side port of the enteric tube below the diaphragm in the stomach. Electronically Signed   By: Narda Rutherford M.D.   On: 10/09/2023 21:18   DG Chest Port 1 View Result Date: 10/09/2023 CLINICAL DATA:  Questionable sepsis EXAM: PORTABLE CHEST 1 VIEW COMPARISON:  Chest x-ray 08/04/2023 FINDINGS: Cardiac silhouette is enlarged. Prosthetic heart valve and sternotomy wires are again seen. There are patchy opacities in the left lung base which are new from prior. There are small bilateral pleural  effusions. There is no pneumothorax or acute fracture. IMPRESSION: 1. Patchy opacities in the left lung base which are new from prior, concerning for pneumonia. 2. Small bilateral pleural effusions. Electronically Signed   By: Darliss Cheney M.D.   On: 10/09/2023 16:16    Microbiology Recent Results (from the past 240 hours)  Blood Culture (routine x 2)     Status: None (Preliminary result)   Collection Time: 10/09/23  2:15 PM   Specimen: BLOOD  Result Value Ref Range Status   Specimen Description BLOOD BLOOD RIGHT ARM  Final   Special Requests   Final    BOTTLES DRAWN AEROBIC AND ANAEROBIC Blood Culture adequate volume   Culture   Final    NO GROWTH < 24 HOURS Performed at [facility], [address], [place], [place] [address]    Report Status PENDING  Incomplete  Resp panel by RT-PCR (RSV, Flu A&B, Covid) Anterior Nasal Swab     Status: None   Collection Time: 10/09/23  2:15 PM   Specimen: Anterior Nasal Swab  Result Value Ref Range Status   SARS Coronavirus 2 by RT PCR NEGATIVE NEGATIVE Final    Comment: (NOTE) SARS-CoV-2 target nucleic acids are NOT DETECTED.  The SARS-CoV-2 RNA is generally detectable in upper respiratory specimens during the acute phase of infection. The lowest concentration of SARS-CoV-2 viral copies this assay can detect  is 138 copies/mL. A negative result does not preclude SARS-Cov-2 infection and should not be used as the sole basis for treatment or other patient management decisions. A negative result may occur with  improper specimen collection/handling, submission of specimen other than nasopharyngeal swab, presence of viral mutation(s) within the areas targeted by this assay, and inadequate number of viral copies(<138 copies/mL). A negative result must be combined with clinical observations, patient history, and epidemiological information. The expected result is Negative.  Fact Sheet for Patients:  BloggerCourse.com  Fact Sheet for Healthcare Providers:  SeriousBroker.it  This test is no t yet approved or cleared by the [place] FDA and  has been authorized for detection and/or diagnosis of SARS-CoV-2 by FDA under an Emergency Use Authorization (EUA). This EUA will remain  in effect (meaning this test can be used) for the duration of the COVID-19 declaration under Section 564(b)(1) of the Act, 21 U.S.C.section 360bbb-3(b)(1), unless the authorization is terminated  or revoked sooner.       Influenza A by PCR NEGATIVE NEGATIVE Final   Influenza B by PCR NEGATIVE NEGATIVE Final    Comment: (NOTE) The Xpert Xpress SARS-CoV-2/FLU/RSV plus assay is intended as an aid in the diagnosis of influenza from Nasopharyngeal swab specimens and should not be used as a sole basis for treatment. Nasal washings and aspirates are unacceptable for Xpert Xpress SARS-CoV-2/FLU/RSV testing.  Fact Sheet for Patients: BloggerCourse.com  Fact Sheet for Healthcare Providers: SeriousBroker.it  This test is not yet approved or cleared by the [place] FDA and has been authorized for detection and/or diagnosis of SARS-CoV-2 by FDA under an Emergency Use Authorization (EUA). This EUA will remain in effect  (meaning this test can be used) for the duration of the COVID-19 declaration under Section 564(b)(1) of the Act, 21 U.S.C. section 360bbb-3(b)(1), unless the authorization is terminated or revoked.     Resp Syncytial Virus by PCR NEGATIVE NEGATIVE Final    Comment: (NOTE) Fact Sheet for Patients: BloggerCourse.com  Fact Sheet for Healthcare Providers: SeriousBroker.it  This test is not yet approved or cleared by the [place] FDA and has been authorized for detection and/or diagnosis of SARS-CoV-2 by FDA under an Emergency Use Authorization (EUA). This EUA will remain in effect (meaning this test can be used) for the duration of the COVID-19 declaration under Section 564(b)(1) of the Act, 21 U.S.C. section 360bbb-3(b)(1), unless the authorization is terminated or revoked.  Performed at [facility], [address] [place], [place] [address]   Blood Culture (routine x 2)     Status: None (Preliminary result)   Collection Time: 10/09/23  4:04 PM   Specimen: BLOOD  Result Value Ref Range Status   Specimen Description BLOOD RIGHT ANTECUBITAL  Final   Special Requests   Final    BOTTLES DRAWN AEROBIC AND ANAEROBIC Blood Culture adequate volume   Culture   Final    NO GROWTH < 24 HOURS Performed at [facility], [address], [place], [place] [address]    Report Status PENDING  Incomplete  Resp panel by RT-PCR (RSV, Flu A&B, Covid) Anterior Nasal Swab     Status: None   Collection Time: 10/09/23  4:29 PM   Specimen: Anterior Nasal Swab  Result Value Ref Range Status   SARS Coronavirus 2 by RT PCR NEGATIVE NEGATIVE Final    Comment: (NOTE) SARS-CoV-2 target nucleic acids are NOT DETECTED.  The SARS-CoV-2 RNA is generally detectable in upper respiratory specimens during the acute phase of infection. The lowest concentration of SARS-CoV-2 viral copies this assay can detect is 138 copies/mL. A negative  result does not preclude SARS-Cov-2 infection and should not be used as the sole basis for treatment or other patient management decisions. A negative result may occur with  improper specimen collection/handling, submission of specimen other than nasopharyngeal swab, presence of viral mutation(s) within the areas targeted by this assay, and inadequate number of viral copies(<138 copies/mL). A negative result must be combined with clinical observations, patient history, and epidemiological information. The expected result is Negative.  Fact Sheet for Patients:  BloggerCourse.com  Fact Sheet for Healthcare Providers:  SeriousBroker.it  This test is no t yet approved or cleared by the [place] FDA and  has been authorized for detection and/or diagnosis of SARS-CoV-2 by FDA under an Emergency Use Authorization (EUA). This EUA will remain  in effect (meaning this test can be used) for the duration of the COVID-19 declaration under Section 564(b)(1) of the Act, 21 U.S.C.section 360bbb-3(b)(1), unless the authorization is terminated  or revoked sooner.       Influenza A by PCR NEGATIVE NEGATIVE Final   Influenza B by PCR NEGATIVE NEGATIVE Final    Comment: (NOTE) The Xpert Xpress SARS-CoV-2/FLU/RSV plus assay is intended as an aid in the diagnosis of influenza from Nasopharyngeal swab specimens and should not be used as a sole basis for treatment. Nasal washings and aspirates are unacceptable for Xpert Xpress SARS-CoV-2/FLU/RSV testing.  Fact Sheet for Patients: BloggerCourse.com  Fact Sheet for Healthcare Providers: SeriousBroker.it  This test is not yet approved or cleared by the [place] FDA and has been authorized for detection and/or diagnosis of SARS-CoV-2 by FDA under an Emergency Use Authorization (EUA). This EUA will remain in effect (meaning this test can be used)  for the duration of the COVID-19 declaration under Section 564(b)(1) of the Act, 21 U.S.C. section 360bbb-3(b)(1), unless the authorization is terminated or revoked.     Resp Syncytial Virus by PCR NEGATIVE NEGATIVE Final    Comment: (NOTE) Fact Sheet for Patients: BloggerCourse.com  Fact Sheet for Healthcare  Providers: SeriousBroker.it  This test is not yet approved or cleared by the [place] and has been authorized for detection and/or diagnosis of SARS-CoV-2 by FDA under an Emergency Use Authorization (EUA). This EUA will remain in effect (meaning this test can be used) for the duration of the COVID-19 declaration under Section 564(b)(1) of the Act, 21 U.S.C. section 360bbb-3(b)(1), unless the authorization is terminated or revoked.  Performed at [facility], [address] [place], [place] [address]   MRSA Next Gen by PCR, Nasal     Status: None   Collection Time: 10/09/23 10:24 PM   Specimen: Nasal Mucosa; Nasal Swab  Result Value Ref Range Status   MRSA by PCR Next Gen NOT DETECTED NOT DETECTED Final    Comment: (NOTE) The GeneXpert MRSA Assay (FDA approved for NASAL specimens only), is one component of a comprehensive MRSA colonization surveillance program. It is not intended to diagnose MRSA infection nor to guide or monitor treatment for MRSA infections. Test performance is not FDA approved in patients less than 75 years old. Performed at [facility], [address] [place], [place] [address]     Lab Basic Metabolic Panel: Recent Labs  Lab 10/09/23 1415 10/09/23 2120  0106  0520  NA 124* 127* 127* 131*  K 6.1* 4.8 4.4 4.2  CL 96* 100 98 95*  CO2 17* 13* 15* 19*  GLUCOSE 144* 209* 256* 191*  BUN 41* 40* 41* 44*  CREATININE 1.58* 1.64* 1.80* 1.91*  CALCIUM 8.9 9.3 8.2* 8.1*  MG  --  2.5*  --   --   PHOS  --  6.6*  --  7.1*   Liver Function Tests: Recent  Labs  Lab 10/09/23 1415 10/09/23 2120  0520  AST 207* 254* 1,010*  ALT 165* 220* 827*  ALKPHOS 55 50 62  BILITOT 1.4* 0.9 0.8  PROT 6.0* 5.3* 5.2*  ALBUMIN 3.6 3.0* 3.0*   No results for input(s): "LIPASE", "AMYLASE" in the last 168 hours. No results for input(s): "AMMONIA" in the last 168 hours. CBC: Recent Labs  Lab 10/09/23 1415 10/09/23 2120 10/09/23 2344  0520  WBC 8.6 9.5 10.7* 20.9*  NEUTROABS 7.6 7.6  --   --   HGB 12.5* 11.7* 11.6* 11.2*  HCT 37.7* 37.8* 35.8* 34.1*  MCV 99.5 107.4* 105.0* 102.1*  PLT 143* 129* 131* 141*   Cardiac Enzymes: No results for input(s): "CKTOTAL", "CKMB", "CKMBINDEX", "TROPONINI" in the last 168 hours. Sepsis Labs: Recent Labs  Lab 10/09/23 1415 10/09/23 1604 10/09/23 2120 10/09/23 2214 10/09/23 2344  0106  0520  0824  PROCALCITON  --   --  0.11  --   --   --  0.45  0.45  --   WBC 8.6  --  9.5  --  10.7*  --  20.9*  --   LATICACIDVEN 3.0*   < > 7.1* 7.4*  --  6.4* 6.4* 4.5*   < > = values in this interval not displayed.    Jean-Pierre Alyissa Whidbee , 12:07 PM

## 2023-10-26 NOTE — Progress Notes (Signed)
   10/09/23 2038  Spiritual Encounters  Type of Visit Initial  Care provided to: Family (Wife at bedside)  Referral source Code page  Reason for visit Code  OnCall Visit Yes  Spiritual Framework  Presenting Themes Impactful experiences and emotions;Rituals and practive;Values and beliefs  Interventions  Spiritual Care Interventions Made Established relationship of care and support;Compassionate presence;Reflective listening;Narrative/life review;Prayer;Encouragement  Intervention Outcomes  Outcomes Connection to spiritual care;Awareness around self/spiritual resourses;Awareness of support   Met wife initially in the ED Room 12; Code Blue

## 2023-10-26 NOTE — Procedures (Signed)
Central Venous Catheter Insertion Procedure Note  Theotis Alavi  [id number]  28-Jan-1941  Date:  Time:12:20 AM   Provider Performing:Devery Murgia L Rust-[place]   Procedure: Insertion of Non-tunneled Central Venous [phone]) with US guidance (56213)   Indication(s) Medication administration  Consent Risks of the procedure as well as the alternatives and risks of each were explained to the patient and/or caregiver.  Consent for the procedure was obtained and is signed in the bedside chart  Anesthesia Topical only with 1% lidocaine  & fentanyl drip  Timeout Verified patient identification, verified procedure, site/side was marked, verified correct patient position, special equipment/implants available, medications/allergies/relevant history reviewed, required imaging and test results available.  Sterile Technique Maximal sterile technique including full sterile barrier drape, hand hygiene, sterile gown, sterile gloves, mask, hair covering, sterile ultrasound probe cover (if used).  Procedure Description Area of catheter insertion was cleaned with chlorhexidine and draped in sterile fashion.  With real-time ultrasound guidance a central venous catheter was placed into the right internal jugular vein. Nonpulsatile blood flow and easy flushing noted in all ports.  The catheter was sutured in place and sterile dressing applied.  Complications/Tolerance None; patient tolerated the procedure well. Chest X-ray is ordered to verify placement for internal jugular or subclavian cannulation.   Chest x-ray is not ordered for femoral cannulation.  EBL Minimal  Specimen(s) None  Betsey Holiday, AGACNP-BC Acute Care Nurse Practitioner [facility]   [phone] / [phone] Please see Amion for details.

## 2023-10-26 NOTE — Progress Notes (Signed)
Pt extubated to comfort care.  

## 2023-10-26 NOTE — Progress Notes (Signed)
    1000  Spiritual Encounters  Type of Visit Initial  Care provided to: Pt and family  Referral source Chaplain assessment  Reason for visit End-of-life  OnCall Visit No  Spiritual Framework  Presenting Themes Courage hope and growth;Impactful experiences and emotions   Chaplain went to follow up with family up patient that is EOL. Family informed the chaplain that they made the decision to have the patient extubated. Family was very tearful but felt comfortable with their decision. Chaplain informed the chaplain that we can come back when the procedure happens if they would like for [place] to. Chaplain services remain available for spiritual and emotional support.

## 2023-10-26 NOTE — Progress Notes (Signed)
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  Mitral valve prolapse status postrepair.  OSA on CPAP.  Chronic A-fib on Eliquis.  Hypertension and hiatal hernia.  [facility]15 weakness fatigue and hypotension.  He was found to be in profound septic shock complicated by cardiogenic shock.  Required intubation mechanical ventilation in the emergency department due to ongoing respiratory distress which also was complicated by Marina Goodell intubation cardiac arrest requiring 1 round of CPR to ROSC.  CT head without contrast with no evidence of acute intracranial abnormality  CT angio chest PE 10/09/2023 no signs of pulmonary embolism in the main pulmonary arteries.  CT abdomen pelvis 10/09/2023 question for retroperitoneal hematoma.  Anasarca.  Pertinent  Medical History  As above.  Significant Hospital Events: Including procedures, antibiotic start and stop dates in addition to other pertinent events   10/08/2022 Admitted to the ICU.   Interim History / Subjective:  Patient on 3 pressors.   Objective   Blood pressure (!) 98/57, pulse (!) 104, temperature 99.5 F (37.5 C), temperature source Esophageal, resp. rate 17, height 6' (1.829 m), weight 69.8 kg, SpO2 100%. CVP:  [15 mmHg-41 mmHg] 15 mmHg  Vent Mode: PRVC FiO2 (%):  [50 %-100 %] 100 % Set Rate:  [16 bmp] 16 bmp Vt Set:  [500 mL-600 mL] 600 mL PEEP:  [5 cmH20] 5 cmH20 Plateau Pressure:  [11 cmH20-15 cmH20] 15 cmH20   Intake/Output Summary (Last 24 hours) at  1000 Last data filed at  0800 Gross per 24 hour  Intake 4012.31 ml  Output 585 ml  Net 3427.31 ml   Filed Weights   10/09/23 1406 10/09/23 2230  0500  Weight: 56.2 kg 69.8 kg  69.8 kg    Examination: General: Intubated and deeply sedated.  HENT: Supple neck, reactive pupils Lungs: coarse breath sounds bilaterally  Cardiovascular: Normal S1, Normal S2, irregular  Abdomen: soft non tender non distended  Extremities: Cold, +2 LE edema  Labs and imaging were reviewed.  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  Mitral valve prolapse status postrepair.  OSA on CPAP.  Chronic A-fib on Eliquis.  Hypertension and hiatal hernia.  Presenting to [facility] on 01/15 weakness fatigue and hypotension.  He was found to be in profound septic shock complicated by cardiogenic shock.  Required intubation mechanical ventilation in the emergency department due to ongoing respiratory distress which also was complicated by Marina Goodell intubation cardiac arrest requiring 1 round of CPR to ROSC  #Mixed cardiogenic shock and septic shock currently on 3 pressors  #ICM with EF 25-30%  #Chronic A.fib on eliquis  #AKI with Cr. 1.9 mg/dl secondary to the above.   CNS: Analgosedation for RASS 0 to -1  [facility] Triple pressors for MAP > 65  ID/Pulm Broad spec abx pending further micro data  GI Lax as needed.  Endo: POC 140-180   Patient prognosis is guarded. Current Goals DNR, ongoing discussion with family members who are leaning towards CMO.   I spent 45 minutes caring for this patient today, including preparing to see the patient, obtaining a  medical history , reviewing a separately obtained history, counseling and educating the patient/family/caregiver, documenting clinical information in the electronic health record, and independently interpreting results (not separately reported/billed) and communicating results to the patient/family/caregiver  Janann Colonel, MD  Pulmonary Critical Care  2:21 PM

## 2023-10-26 DEATH — deceased
